# Patient Record
Sex: Female | Born: 1970 | Race: White | Hispanic: No | Marital: Married | Smoking: Never smoker
Health system: Southern US, Community
[De-identification: ages and names within clinical notes are randomized; demographics above are authoritative.]

## PROBLEM LIST (undated history)

## (undated) DIAGNOSIS — M65949 Unspecified synovitis and tenosynovitis, unspecified hand: Secondary | ICD-10-CM

## (undated) DIAGNOSIS — I493 Ventricular premature depolarization: Secondary | ICD-10-CM

## (undated) DIAGNOSIS — Z9889 Other specified postprocedural states: Secondary | ICD-10-CM

## (undated) DIAGNOSIS — F32A Depression, unspecified: Secondary | ICD-10-CM

## (undated) DIAGNOSIS — G4700 Insomnia, unspecified: Secondary | ICD-10-CM

## (undated) DIAGNOSIS — R519 Headache, unspecified: Secondary | ICD-10-CM

## (undated) DIAGNOSIS — M659 Synovitis and tenosynovitis, unspecified: Secondary | ICD-10-CM

## (undated) DIAGNOSIS — R42 Dizziness and giddiness: Secondary | ICD-10-CM

## (undated) DIAGNOSIS — M549 Dorsalgia, unspecified: Secondary | ICD-10-CM

## (undated) DIAGNOSIS — J45909 Unspecified asthma, uncomplicated: Secondary | ICD-10-CM

## (undated) DIAGNOSIS — R112 Nausea with vomiting, unspecified: Secondary | ICD-10-CM

## (undated) DIAGNOSIS — R0683 Snoring: Secondary | ICD-10-CM

## (undated) DIAGNOSIS — T8859XA Other complications of anesthesia, initial encounter: Secondary | ICD-10-CM

## (undated) DIAGNOSIS — R002 Palpitations: Secondary | ICD-10-CM

## (undated) DIAGNOSIS — I499 Cardiac arrhythmia, unspecified: Secondary | ICD-10-CM

## (undated) DIAGNOSIS — F419 Anxiety disorder, unspecified: Secondary | ICD-10-CM

## (undated) DIAGNOSIS — R0600 Dyspnea, unspecified: Secondary | ICD-10-CM

## (undated) HISTORY — DX: Ventricular premature depolarization: I49.3

## (undated) HISTORY — DX: Snoring: R06.83

## (undated) HISTORY — DX: Headache, unspecified: R51.9

## (undated) HISTORY — DX: Dizziness and giddiness: R42

## (undated) HISTORY — DX: Palpitations: R00.2

## (undated) HISTORY — PX: OTHER SURGICAL HISTORY: SHX169

## (undated) HISTORY — DX: Dorsalgia, unspecified: M54.9

## (undated) HISTORY — DX: Unspecified synovitis and tenosynovitis, unspecified hand: M65.949

## (undated) HISTORY — PX: TUBAL LIGATION: SHX77

## (undated) HISTORY — DX: Synovitis and tenosynovitis, unspecified: M65.9

## (undated) HISTORY — DX: Insomnia, unspecified: G47.00

---

## 2001-05-15 ENCOUNTER — Ambulatory Visit (HOSPITAL_COMMUNITY): Admission: RE | Admit: 2001-05-15 | Discharge: 2001-05-15 | Payer: Self-pay | Admitting: Obstetrics and Gynecology

## 2001-08-09 ENCOUNTER — Encounter (INDEPENDENT_AMBULATORY_CARE_PROVIDER_SITE_OTHER): Payer: Self-pay

## 2001-08-09 ENCOUNTER — Inpatient Hospital Stay (HOSPITAL_COMMUNITY): Admission: AD | Admit: 2001-08-09 | Discharge: 2001-08-13 | Payer: Self-pay | Admitting: Obstetrics and Gynecology

## 2001-09-12 ENCOUNTER — Other Ambulatory Visit: Admission: RE | Admit: 2001-09-12 | Discharge: 2001-09-12 | Payer: Self-pay | Admitting: Obstetrics and Gynecology

## 2002-10-23 ENCOUNTER — Other Ambulatory Visit: Admission: RE | Admit: 2002-10-23 | Discharge: 2002-10-23 | Payer: Self-pay | Admitting: Obstetrics and Gynecology

## 2003-11-29 ENCOUNTER — Other Ambulatory Visit: Admission: RE | Admit: 2003-11-29 | Discharge: 2003-11-29 | Payer: Self-pay | Admitting: Obstetrics and Gynecology

## 2004-05-18 ENCOUNTER — Encounter: Admission: RE | Admit: 2004-05-18 | Discharge: 2004-05-18 | Payer: Self-pay | Admitting: Obstetrics and Gynecology

## 2005-05-17 ENCOUNTER — Other Ambulatory Visit: Admission: RE | Admit: 2005-05-17 | Discharge: 2005-05-17 | Payer: Self-pay | Admitting: Obstetrics and Gynecology

## 2005-08-16 ENCOUNTER — Other Ambulatory Visit: Admission: RE | Admit: 2005-08-16 | Discharge: 2005-08-16 | Payer: Self-pay | Admitting: Obstetrics and Gynecology

## 2009-06-02 ENCOUNTER — Encounter: Admission: RE | Admit: 2009-06-02 | Discharge: 2009-06-02 | Payer: Self-pay | Admitting: Obstetrics and Gynecology

## 2009-09-26 ENCOUNTER — Encounter (INDEPENDENT_AMBULATORY_CARE_PROVIDER_SITE_OTHER): Payer: Self-pay | Admitting: *Deleted

## 2009-10-01 ENCOUNTER — Encounter (INDEPENDENT_AMBULATORY_CARE_PROVIDER_SITE_OTHER): Payer: Self-pay

## 2009-10-03 ENCOUNTER — Ambulatory Visit: Payer: Self-pay | Admitting: Internal Medicine

## 2010-02-23 ENCOUNTER — Encounter: Admission: RE | Admit: 2010-02-23 | Discharge: 2010-02-23 | Payer: Self-pay | Admitting: Orthopaedic Surgery

## 2010-06-04 ENCOUNTER — Encounter
Admission: RE | Admit: 2010-06-04 | Discharge: 2010-06-04 | Payer: Self-pay | Source: Home / Self Care | Attending: Obstetrics and Gynecology | Admitting: Obstetrics and Gynecology

## 2010-06-09 NOTE — Letter (Signed)
Summary: [facility] Instructions  [facility]  [address] [place], [place] [address]   Phone: [phone]  Fax: [phone]       BERNEICE ZETTLEMOYER    [birth year]    MRN: [id number]        Procedure Day Dorna Bloom:  Farrell Ours  12/05/09     Arrival Time:  7:30am     Procedure Time:  8:30am     Location of Procedure:                    _x _  [facility] (4th Floor)                        PREPARATION FOR COLONOSCOPY WITH MOVIPREP   Starting 5 days prior to your procedure SUNDAY 07/24  do not eat nuts, seeds, popcorn, corn, beans, peas,  salads, or any raw vegetables.  Do not take any fiber supplements (e.g. Metamucil, Citrucel, and Benefiber).  THE DAY BEFORE YOUR PROCEDURE         DATE: THURSDAY  07/28  1.  Drink clear liquids the entire day-NO SOLID FOOD  2.  Do not drink anything colored red or purple.  Avoid juices with pulp.  No orange juice.  3.  Drink at least 64 oz. (8 glasses) of fluid/clear liquids during the day to prevent dehydration and help the prep work efficiently.  CLEAR LIQUIDS INCLUDE: Water Jello Ice Popsicles Tea (sugar ok, no milk/cream) Powdered fruit flavored drinks Coffee (sugar ok, no milk/cream) Gatorade Juice: apple, white grape, white cranberry  Lemonade Clear bullion, consomm, broth Carbonated beverages (any kind) Strained chicken noodle soup Hard Candy                             4.  In the morning, mix first dose of MoviPrep solution:    Empty 1 Pouch A and 1 Pouch B into the disposable container    Add lukewarm drinking water to the top line of the container. Mix to dissolve    Refrigerate (mixed solution should be used within 24 hrs)  5.  Begin drinking the prep at 5:00 p.m. The MoviPrep container is divided by 4 marks.   Every 15 minutes drink the solution down to the next mark (approximately 8 oz) until the full liter is complete.   6.  Follow completed prep with 16 oz of clear liquid of your choice (Nothing  red or purple).  Continue to drink clear liquids until bedtime.  7.  Before going to bed, mix second dose of MoviPrep solution:    Empty 1 Pouch A and 1 Pouch B into the disposable container    Add lukewarm drinking water to the top line of the container. Mix to dissolve    Refrigerate  THE DAY OF YOUR PROCEDURE      DATE: FRIDAY  07/29  Beginning at 6:30 am (5 hours before procedure):         1. Every 15 minutes, drink the solution down to the next mark (approx 8 oz) until the full liter is complete.  2. Follow completed prep with 16 oz. of clear liquid of your choice.    3. You may drink clear liquids until  6:30am  (2 HOURS BEFORE PROCEDURE).   MEDICATION INSTRUCTIONS  Unless otherwise instructed, you should take regular prescription medications with a small sip of water   as early as  possible the morning of your procedure.           OTHER INSTRUCTIONS  You will need a responsible adult at least 42 years of age to accompany you and drive you home.   This person must remain in the waiting room during your procedure.  Wear loose fitting clothing that is easily removed.  Leave jewelry and other valuables at home.  However, you may wish to bring a book to read or  an iPod/MP3 player to listen to music as you wait for your procedure to start.  Remove all body piercing jewelry and leave at home.  Total time from sign-in until discharge is approximately 2-3 hours.  You should go home directly after your procedure and rest.  You can resume normal activities the  day after your procedure.  The day of your procedure you should not:   Drive   Make legal decisions   Operate machinery   Drink alcohol   Return to work  You will receive specific instructions about eating, activities and medications before you leave.    The above instructions have been reviewed and explained to me by   Ulis Rias RN  Oct 03, 2009 8:56 AM     I fully understand and can  verbalize these instructions _____________________________ Date _________

## 2010-06-09 NOTE — Miscellaneous (Signed)
Summary: Lec previsit  Clinical Lists Changes  Medications: Added new medication of MOVIPREP 100 GM  SOLR (PEG-KCL-NACL-NASULF-NA ASC-C) As per prep instructions. - Signed Rx of MOVIPREP 100 GM  SOLR (PEG-KCL-NACL-NASULF-NA ASC-C) As per prep instructions.;  #1 x 0;  Signed;  Entered by: Ulis Rias RN;  Authorized by: Iva Boop MD, [facility];  Method used: Electronically to [facility] [address], [place], [place], [place]  [address], Ph: [phone], Fax: [phone] Observations: Added new observation of NKA: T (10/03/2009 8:30)    Prescriptions: MOVIPREP 100 GM  SOLR (PEG-KCL-NACL-NASULF-NA ASC-C) As per prep instructions.  #1 x 0   Entered by:   Ulis Rias RN   Authorized by:   Iva Boop MD, [facility]   Signed by:   Ulis Rias RN on 10/03/2009   Method used:   Electronically to        [facility] (retail)       [address]       [place], [place]  [address]       Ph: [phone]       Fax: [phone]   RxID:   [id number]

## 2010-06-09 NOTE — Letter (Signed)
Summary: Previsit letter  [facility]  [address] [place], [place] [address]   Phone: [phone]  Fax: [phone]       09/26/2009 MRN: [id number]  Chelsea Flores [address] [place], [place]  [address]  Dear [name],  Welcome to the Gastroenterology Division at [facility].    You are scheduled to see a nurse for your pre-procedure visit on 10-03-09 at 8:30a.m. on the 3rd floor at [facility], [address]  We ask that you try to arrive at our office 15 minutes prior to your appointment time to allow for check-in.  Your nurse visit will consist of discussing your medical and surgical history, your immediate family medical history, and your medications.    Please bring a complete list of all your medications or, if you prefer, bring the medication bottles and we will list them.  We will need to be aware of both prescribed and over the counter drugs.  We will need to know exact dosage information as well.  If you are on blood thinners (Coumadin, Plavix, Aggrenox, Ticlid, etc.) please call our office today/prior to your appointment, as we need to consult with your physician about holding your medication.   Please be prepared to read and sign documents such as consent forms, a financial agreement, and acknowledgement forms.  If necessary, and with your consent, a friend or relative is welcome to sit-in on the nurse visit with you.  Please bring your insurance card so that we may make a copy of it.  If your insurance requires a referral to see a specialist, please bring your referral form from your primary care physician.  No co-pay is required for this nurse visit.     If you cannot keep your appointment, please call [phone] to cancel or reschedule prior to your appointment date.  This allows [place] the opportunity to schedule an appointment for another patient in need of care.    Thank you for choosing [facility] for your medical needs.   We appreciate the opportunity to care for you.  Please visit [place] at our website  to learn more about our practice.                     Sincerely.                                                                                                                   The Gastroenterology Division

## 2010-09-25 NOTE — H&P (Signed)
[facility]  Patient:    Chelsea Flores, Chelsea Flores Visit Number: [id number] MRN: [id number]          Service Type: OBS Location: 910A 9142 01 Attending Physician:  Soledad Gerlach Dictated by:   Guy Sandifer Arleta Creek, M.D. Admit Date:  08/09/2001 Discharge Date: 08/13/2001                           History and Physical  CHIEF COMPLAINT:              Term intrauterine pregnancy for repeat cesarean section.  HISTORY OF PRESENT ILLNESS:   This patient is a 40 year old married white female G2, P1 with a previous cesarean section who desires a repeat.  EDC is 08/10/01, placing her at 39-6/7 weeks.  The patient also desires permanent sterilization.  Risks of the procedure, permanence of the tubal ligation, failure rate, and increased ectopic risk have all been discussed.  All questions have been answered.  PAST MEDICAL HISTORY/PAST SURGICAL HISTORY/FAMILY HISTORY/OBSTETRIC HISTORY:    See ______ form.  MEDICATIONS:                  Prenatal vitamin daily.  ALLERGIES:                    No known drug allergies.  PHYSICAL EXAMINATION:  LUNGS:                        Clear to auscultation.  HEART:                        Regular rate and rhythm.  BACK:                         Without CVA tenderness.  BREASTS:                      Not examined.  ABDOMEN:                      Gravida, nontender.  Cervix not examined.  EXTREMITIES:                  Grossly within normal limits.  NEUROLOGIC:                   Grossly within normal limits.  LABORATORY DATA:              Blood type O-, Rh antibody screen negative, VDRL nonreactive, rubella immune, hepatitis B surface antigen negative.  ASSESSMENT:                   Term intrauterine pregnancy.  PLAN:                         Repeat cesarean section and tubal ligation. Dictated by:   Guy Sandifer Arleta Creek, M.D. Attending Physician:  Soledad Gerlach DD:  09/08/01 TD:  09/08/01 Job:  [id number]

## 2010-09-25 NOTE — Discharge Summary (Signed)
[facility]  Patient:    Chelsea Flores, Chelsea Flores Visit Number: [id number] MRN: [id number]          Service Type: OBS Location: 910A 9142 01 Attending Physician:  Soledad Gerlach Dictated by:   Julio Sicks, N.P. Admit Date:  08/09/2001 Discharge Date: 08/13/2001                             Discharge Summary  ADMISSION DIAGNOSES:          1. Intrauterine pregnancy at term.                               2. History of low transverse cesarean section,                                  desires repeat.                               3. Desires permanent sterilization.  DISCHARGE DIAGNOSES:          1. Repeat low transverse cesarean section.                               2. Viable female infant.                               3. Permanent sterilization.  PROCEDURE:                    Repeat low transverse cesarean section and bilateral tubal ligation.  REASON FOR ADMISSION:         Please see dictated H&P.  HOSPITAL COURSE:              The patient was admitted on August 09, 2001, for the above named procedure without complications.  A viable female infant weighing 7 pounds 6 ounces with Apgars of 9 at one minute and 9 at five minutes was delivered.  Cord pH was 7.30.  On postoperative day #1, the patient had adequate return of bowel function.  She was tolerating a liquid diet without complaints of nausea and vomiting.  Fundus was firm.  Abdomen was soft.  Abdominal dressing clean, dry, and intact.  Hemoglobin was 10.7. Hematocrit 31.3, WBC 8.7.  Foley catheter was discontinued.  On postoperative day #2, the patient was tolerating a regular diet, ambulating without assistance, voiding without difficulty.  Fundus was firm, incision was clean, dry, and intact with slight ecchymosis noted below the incisional site. RhoGAM was administered to mother.  On postoperative day #3, the patient complained of ap ositional headache.  Anesthesia was in for evaluation.   Blood patch was applied.  On postoperative day #4, headache was resolved.  Incision was clean, dry, and intact.  Staples were removed.  The patient was discharged home.  CONDITION ON DISCHARGE:       Good.  DIET:                         Regular as tolerated.  ACTIVITY:  No heavy lifting, no driving, no vaginal entry.  FOLLOW-UP:                    The patient is to follow up in the office in one to two weeks for an incision check.  She is to call for a temperature greater than 100 degrees, persistent nausea and vomiting, heavy vaginal bleeding, and/or redness or draiange from the incision site.  DISCHARGE MEDICATIONS:        1. Zoloft 50 mg one p.o. q.d.                               2. Ambien 10 mg one p.o. at h.s. p.r.n.                                  insomnia.                               3. Prescription for Tylox #20 one to two every                                  four to six hours p.r.n. pain.                               4. Prenatal vitamins one p.o. q.d. Dictated by:   Julio Sicks, N.P. Attending Physician:  Soledad Gerlach DD:  08/29/01 TD:  08/29/01 Job: [id number]

## 2010-09-25 NOTE — Op Note (Signed)
[facility]  Patient:    Chelsea Flores, Chelsea Flores Visit Number: [id number] MRN: [id number]          Service Type: Attending:  Guy Sandifer. Arleta Creek, M.D. Dictated by:   Guy Sandifer Arleta Creek, M.D. Proc. Date: 08/09/01                             Operative Report  PREOPERATIVE DIAGNOSES:       Desires repeat cesarean section and permanent sterilization.  POSTOPERATIVE DIAGNOSES:      Desires repeat cesarean section and permanent sterilization.  PROCEDURE:                    Repeat low transverse cesarean section with bilateral tubal ligation.  SURGEON:                      Guy Sandifer. Arleta Creek, M.D.  ANESTHESIA:                   Spinal.  ESTIMATED BLOOD LOSS:         800 cc.  FINDINGS:                     Viable female infant.  Apgars 9 and 9 at one and five minutes respectively.  Arterial cord pH 7.30.  Birth weight pending.  INDICATIONS AND CONSENT:      This patient is a 40 year old married white female G2, P1 with a previous cesarean section who desires repeat.  She also desires permanent sterilization.  Her EDC is August 10, 2001.  Potential risks and complications have been discussed with the patient.  Tubal ligation with its permanence and failure rate and increased ectopic risk has also been discussed.  All questions are answered and consent is signed on the chart.  PROCEDURE:                    Patient is taken to the operating room where a spinal anesthetic is placed.  She is then placed in the dorsal supine position with a 15 degree left lateral wedge.  She is then prepped.  Foley catheter is placed in the bladder as a drain and she is draped in a sterile fashion. After testing for adequate spinal anesthesia, skin is entered through the previous Pfannenstiel scar which is removed on the way in.  Dissection is carried out in layers to the peritoneum.  The vesicouterine peritoneum had adhered very high on the anterior uterine fundus.  This was  taken down.  The uterus is then incised in a low transverse manner.  The uterine cavity is entered bluntly with a Kelly clamp.  The lower part of an anterior placenta is encountered on the way in.  Going under the placenta the vertex is delivered. Artificial rupture of membranes for clear fluid is carried out and the vertex is delivered.  The oro and nasopharynx are suctioned.  Nuchal cord x1 is noted.  Remainder of the infant is delivered.  Good cry and tone is noted. The infant is handed to the awaiting pediatrics team.  Placenta is manually delivered.  There is a slight heart shape to the internal uterine contour. Uterus is clean.  Uterus is closed in a running locking fashion with 0 Monocryl suture which achieves good hemostasis.  The left fallopian tube is identified from cornu to fimbria, grasped in its mid ampullary portion  with the Babcock clamp, and a knuckle of tube is then ligated with two 0 plain sutures.  The intervening knuckle of fallopian tube is then sharply resected. Hemostasis is obtained completely with cautery.  A similar procedure is carried out on the right fallopian tube.  Ovaries are normal bilaterally. 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 Irrigation is carried out.  The rectus muscle was reapproximated in the midline with interrupted 0 Monocryl suture.  THe anterior rectus fascia is closed in a running fashion with 0 PDS suture.  Skin is closed with clips. All sponge, instrument, and needle counts are correct.  The patient is transferred to the recovery room in stable condition. Dictated by:   Guy Sandifer Arleta Creek, M.D. Attending:  Guy Sandifer Arleta Creek, M.D. DD:  08/09/01 TD:  08/09/01 Job: [id number]

## 2011-05-12 ENCOUNTER — Other Ambulatory Visit: Payer: Self-pay | Admitting: Obstetrics and Gynecology

## 2011-05-12 DIAGNOSIS — Z1231 Encounter for screening mammogram for malignant neoplasm of breast: Secondary | ICD-10-CM

## 2011-06-07 ENCOUNTER — Ambulatory Visit
Admission: RE | Admit: 2011-06-07 | Discharge: 2011-06-07 | Disposition: A | Discharge status: Home / Self Care | Payer: BC Managed Care – PPO | Source: Ambulatory Visit | Attending: Obstetrics and Gynecology | Admitting: Obstetrics and Gynecology

## 2011-06-07 DIAGNOSIS — Z1231 Encounter for screening mammogram for malignant neoplasm of breast: Secondary | ICD-10-CM

## 2012-04-13 ENCOUNTER — Ambulatory Visit (INDEPENDENT_AMBULATORY_CARE_PROVIDER_SITE_OTHER): Payer: BC Managed Care – PPO | Admitting: Sports Medicine

## 2012-04-13 ENCOUNTER — Encounter: Payer: Self-pay | Admitting: Sports Medicine

## 2012-04-13 VITALS — BP 103/70 | HR 74 | Ht 68.0 in | Wt 143.0 lb

## 2012-04-13 DIAGNOSIS — M25559 Pain in unspecified hip: Secondary | ICD-10-CM

## 2012-04-13 DIAGNOSIS — M25551 Pain in right hip: Secondary | ICD-10-CM

## 2012-04-13 NOTE — Assessment & Plan Note (Signed)
I advised her in a series of hip rehabilitation exercises to do at home on a daily basis  After 2 weeks resume running -- 20 minutes 4 times a week She can increase by 5 minutes weekly Recheck by me in 6 weeks  Key to resolving this will be to increase the hip abduction strength on the right  If not resolving I will ultrasound on return to clinic

## 2012-04-13 NOTE — Patient Instructions (Addendum)
Please do suggested hip exercises daily  Ok to start easing into back into running in 2 weeks  Please follow up in 6 weeks  Thank you for seeing [place] today!

## 2012-04-13 NOTE — Progress Notes (Signed)
Patient ID: Chelsea Flores, female   DOB: 09/01/1970, 41 y.o.   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 RT outer hip pain First in 2000 after childbirth - had to labor on RT side  Then past 2 yrs pops and painful upper outer hip MRI negative for any joint issue This keeps recurring and stops running  Examination Thin and fit

## 2012-04-13 NOTE — Progress Notes (Signed)
  Subjective:    Patient ID: Chelsea Flores, female    DOB: [birth year], 41 y.o.   MRN: [id number]  HPI  Pt presents to clinic for evaluation of rt posterior hip pain x more than 1 year. Ran 5 marathons between 2005-2008 with no major hip problems.  Went skiing about 1 year ago, and noticed a sharp hip pain on this trip. Since then has had a sharp pain with running at 0.5 mile that does not persist long, but has not been running in 1 year 2/2 pain. Has seen Dr. Rayburn Ma- had negative MR arthrogram.  Would like to start running again.   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 She had significant hip pain after having to labor on her right side in year 2000 during childbirth induction.  Review of Systems     Objective:   Physical Exam Thin and fit  130 deg hip rotation bilat Excellent alignment Normal leg lengths Note significant tenderness to palpation over the greater trochanter on either side. On the right hip there is some mild tenderness to palpation over the mid belly of the gluteus medius muscle. Hip abduction strength on the right is significantly less than on the left. Hip rotation and hip flexion strength are normal. Good quadriceps development bilaterally.  Feet show normal structure with preserved arches  Running gait shows good form with a neutral foot strike       Assessment & Plan:

## 2012-05-09 ENCOUNTER — Other Ambulatory Visit: Payer: Self-pay | Admitting: Obstetrics and Gynecology

## 2012-05-09 DIAGNOSIS — Z1231 Encounter for screening mammogram for malignant neoplasm of breast: Secondary | ICD-10-CM

## 2012-05-25 ENCOUNTER — Ambulatory Visit: Payer: BC Managed Care – PPO | Admitting: Sports Medicine

## 2012-06-08 ENCOUNTER — Ambulatory Visit
Admission: RE | Admit: 2012-06-08 | Discharge: 2012-06-08 | Disposition: A | Discharge status: Home / Self Care | Payer: BC Managed Care – PPO | Source: Ambulatory Visit | Attending: Obstetrics and Gynecology | Admitting: Obstetrics and Gynecology

## 2012-06-08 DIAGNOSIS — Z1231 Encounter for screening mammogram for malignant neoplasm of breast: Secondary | ICD-10-CM

## 2012-07-13 ENCOUNTER — Ambulatory Visit: Payer: BC Managed Care – PPO | Admitting: Sports Medicine

## 2012-07-13 ENCOUNTER — Encounter: Payer: Self-pay | Admitting: Sports Medicine

## 2012-07-13 ENCOUNTER — Ambulatory Visit (INDEPENDENT_AMBULATORY_CARE_PROVIDER_SITE_OTHER): Payer: BC Managed Care – PPO | Admitting: Sports Medicine

## 2012-07-13 VITALS — Ht 68.0 in | Wt 145.0 lb

## 2012-07-13 DIAGNOSIS — M25551 Pain in right hip: Secondary | ICD-10-CM

## 2012-07-13 NOTE — Progress Notes (Signed)
Patient ID: Chelsea Flores, female   DOB: 01-01-1971, 42 y.o.   MRN: [id number]  Patient with a history of right lateral hip pain that has been present since 2000 Since her last visit she is 70% better She has been able to run around 2-1/2 miles no pain 3 miles causes some mild pain She has been faithful doing the exercises  She feels stronger   For the past 2 weeks she's had benign positional vertigo and has had to back off of running   Examination   No acute distress   Hip: ROM IR: 45 deg bilat and ER is 80 deg Strength is IR: 5/5, ER: 5/5, Flexion: 5/5, Extension: 5/5, Abduction: 5/5, Adduction: 5/5 Pelvic alignment unremarkable to inspection and palpation. Standing hip rotation and gait without trendelenburg / unsteadiness. Greater trochanter without tenderness to palpation. No tenderness over piriformis and greater trochanter. No SI joint tenderness and normal FABER She has a markedly improved strength on abduction movement.

## 2012-07-13 NOTE — Assessment & Plan Note (Signed)
Her problems seem to be related to gluteus medius weakness  She has made excellent improvement  Keep up exercises 3 times a week  Recheck if symptoms recur or if she has new problems

## 2013-06-26 ENCOUNTER — Other Ambulatory Visit: Payer: Self-pay

## 2013-06-26 DIAGNOSIS — Z1231 Encounter for screening mammogram for malignant neoplasm of breast: Secondary | ICD-10-CM

## 2013-07-12 ENCOUNTER — Ambulatory Visit
Admission: RE | Admit: 2013-07-12 | Discharge: 2013-07-12 | Disposition: A | Discharge status: Home / Self Care | Payer: Self-pay | Source: Ambulatory Visit

## 2013-07-12 DIAGNOSIS — Z1231 Encounter for screening mammogram for malignant neoplasm of breast: Secondary | ICD-10-CM

## 2013-08-16 ENCOUNTER — Encounter: Payer: Self-pay | Admitting: Sports Medicine

## 2013-08-16 ENCOUNTER — Ambulatory Visit (INDEPENDENT_AMBULATORY_CARE_PROVIDER_SITE_OTHER): Payer: BC Managed Care – PPO | Admitting: Sports Medicine

## 2013-08-16 VITALS — BP 113/78 | HR 80 | Ht 68.0 in | Wt 145.0 lb

## 2013-08-16 DIAGNOSIS — M25559 Pain in unspecified hip: Secondary | ICD-10-CM

## 2013-08-16 NOTE — Patient Instructions (Addendum)
Complete exercises given and call back in 6 weeks if needed.  You can continue to run as long as no pain occurs.

## 2013-08-16 NOTE — Progress Notes (Signed)
Subjective:     Patient ID: Chelsea Flores, female   DOB: [birth year], 43 y.o.   MRN: [id number]  HPI   Left Hip Pain: Patient reports she is having difficulty over the last few days with rotation of her hip. She reports she was reaching for something above her head and rotated at her hips from external rotation to internal and she experienced a sharp stabbing pain in her buttock area on the left that made her fall to the ground. She states the pain was so strong she thought she dislocated her hip. She has felt a similar pain yesterday with internal rotation of her hip while sitting on a stool. She has no injury that she is aware of and is an avid runner. She is having complications with her daily running.   In the past we treated her for right hip pain from gluteus medius weakness and that has resolved  Review of Systems Negative, with the exception of above mentioned in HPI     Objective:   Physical Exam BP 113/78  Pulse 80  Ht 5\' 8"  (1.727 m)  Wt 145 lb (65.772 kg)  BMI 22.05 kg/m2 Gen: NAD. Well nourished, well developed,physically fit female MSK: Negative leg raises bilaterally. Left FABRE with pain in muscle/lateral hip, no SI or HIP joint pain. 5/5 muscle strength of the left hip. No Gluteus  Medius weakness.  Normal ROM with pain on external rotation and internal rotation.  She has a deep area of palpable pain that seems to be over the inferior gemellus muscle  Assessment/plan:  BRITLEE SKOLNIK is 43 y.o. female with likely an inferior gemellus muscle insertion injury.  - [place] in office: Small calcification and possible tear of inferior gemellus muscle.  - Nitro considered, however contraindicated with her history of migraines.  - Hip exercises given. - Apply heat and deep tissue massage may be beneficial.  - f/u in 6 weeks if needed.

## 2013-08-16 NOTE — Assessment & Plan Note (Signed)
Patient with a second episode of lateral hip pain the some of the left  She does have a history of some possible disc disease but this does not sound related  See plan in the progress note

## 2014-08-05 ENCOUNTER — Other Ambulatory Visit: Payer: Self-pay | Admitting: Obstetrics and Gynecology

## 2014-08-05 DIAGNOSIS — R928 Other abnormal and inconclusive findings on diagnostic imaging of breast: Secondary | ICD-10-CM

## 2014-08-08 ENCOUNTER — Ambulatory Visit
Admission: RE | Admit: 2014-08-08 | Discharge: 2014-08-08 | Disposition: A | Discharge status: Home / Self Care | Payer: BLUE CROSS/BLUE SHIELD | Source: Ambulatory Visit | Attending: Obstetrics and Gynecology | Admitting: Obstetrics and Gynecology

## 2014-08-08 DIAGNOSIS — R928 Other abnormal and inconclusive findings on diagnostic imaging of breast: Secondary | ICD-10-CM

## 2014-12-10 ENCOUNTER — Other Ambulatory Visit: Payer: Self-pay | Admitting: Obstetrics and Gynecology

## 2014-12-10 DIAGNOSIS — N6310 Unspecified lump in the right breast, unspecified quadrant: Secondary | ICD-10-CM

## 2014-12-16 ENCOUNTER — Ambulatory Visit
Admission: RE | Admit: 2014-12-16 | Discharge: 2014-12-16 | Disposition: A | Discharge status: Home / Self Care | Payer: BLUE CROSS/BLUE SHIELD | Source: Ambulatory Visit | Attending: Obstetrics and Gynecology | Admitting: Obstetrics and Gynecology

## 2014-12-16 DIAGNOSIS — N6310 Unspecified lump in the right breast, unspecified quadrant: Secondary | ICD-10-CM

## 2015-05-09 ENCOUNTER — Ambulatory Visit
Admission: RE | Admit: 2015-05-09 | Discharge: 2015-05-09 | Disposition: A | Discharge status: Home / Self Care | Payer: BLUE CROSS/BLUE SHIELD | Source: Ambulatory Visit | Attending: Orthopaedic Surgery | Admitting: Orthopaedic Surgery

## 2015-05-09 ENCOUNTER — Other Ambulatory Visit: Payer: Self-pay | Admitting: Orthopaedic Surgery

## 2015-05-09 DIAGNOSIS — R609 Edema, unspecified: Secondary | ICD-10-CM

## 2015-05-09 DIAGNOSIS — R52 Pain, unspecified: Secondary | ICD-10-CM

## 2015-05-09 DIAGNOSIS — M79662 Pain in left lower leg: Secondary | ICD-10-CM

## 2015-05-12 ENCOUNTER — Ambulatory Visit (INDEPENDENT_AMBULATORY_CARE_PROVIDER_SITE_OTHER): Payer: BLUE CROSS/BLUE SHIELD | Admitting: Sports Medicine

## 2015-05-12 ENCOUNTER — Encounter: Payer: Self-pay | Admitting: Sports Medicine

## 2015-05-12 VITALS — BP 110/60 | Ht 68.0 in | Wt 145.0 lb

## 2015-05-12 DIAGNOSIS — S86812A Strain of other muscle(s) and tendon(s) at lower leg level, left leg, initial encounter: Secondary | ICD-10-CM

## 2015-05-12 DIAGNOSIS — S86112A Strain of other muscle(s) and tendon(s) of posterior muscle group at lower leg level, left leg, initial encounter: Secondary | ICD-10-CM

## 2015-05-12 NOTE — Patient Instructions (Signed)
You have a few partial tears of your Left medial gastroc (and soleus) muscles, also have posterior knee head of left proximal gastroc partial tear. There is no hematoma or other injuries seen on Ultrasound.  Recommend to wear compression sleeve, wear heel lifts in both shoes to (reduce stretching calf muscles). - You may use ice and elevate as needed if sitting for long periods - Do NOT do active stretching or use heat for torn muscle Move ankle gradually. Keep toe walking for now to stay active. Start stationary exercise bike daily for a test. Once you are able to use stationary bike and/or walk then we will progress activity.  Return in 2 weeks for re-evaluation, repeat US

## 2015-05-12 NOTE — Progress Notes (Signed)
Subjective:    Patient ID: Chelsea Flores, female    DOB: [birth year], 45 y.o.   MRN: [id number]  Chelsea Flores is a 45 y.o. female presenting on 05/12/2015 for No chief complaint on file.  HPI  LEFT CALF PAIN: - Patient presents after injury to Left calf while skiing in [place] on 05/04/15. Describes injury as "felt a severe painful pop in Left calf" after just started skiing without any known inciting injury, trauma, fall, or incident, pain 8/10 localized to left calf and posterior knee, she gradually had a controlled fall on her skiis and denies any other injury. Did not hit head or hurt her knees. Unable to bear weight after fall. [facility], [facility] imaging, told it was a calf muscle tear, given Norco 7.5/325 and Ibuprofen 800 PRN, infrequently taking these now. Associated with some swelling and mild bruising. - Today Left calf pain is improved, now no longer painful at rest, only pain with activity or palpation. She had been using ice, ACE wrap, occasional crutches, still walking on left toes, has not resumed running or other activities. Still having pain in posterior knee.  - She is an active runner and skiier, no known prior injuries to left lower extremity. Has upcoming ski trip to [place] 06/21/2015.  No past medical history on file.  Social History   Social History  . Marital Status: Married    Spouse Name: N/A  . Number of Children: N/A  . Years of Education: N/A   Occupational History  . Not on file.   Social History Main Topics  . Smoking status: Never Smoker   . Smokeless tobacco: Never Used  . Alcohol Use: Not on file  . Drug Use: Not on file  . Sexual Activity: Not on file   Other Topics Concern  . Not on file   Social History Narrative    No current outpatient prescriptions on file prior to visit.   No current facility-administered medications on file prior to visit.    Review of Systems Per HPI unless specifically indicated above     Objective:      BP 110/60 mmHg  Ht 5\' 8"  (1.727 m)  Wt 145 lb (65.772 kg)  BMI 22.05 kg/m2  Wt Readings from Last 3 Encounters:  05/12/15 145 lb (65.772 kg)  08/16/13 145 lb (65.772 kg)  07/13/12 145 lb (65.772 kg)    Physical Exam  Constitutional: She appears well-developed and well-nourished. No distress.  HENT:  Head: Normocephalic and atraumatic.  Mouth/Throat: Oropharynx is clear and moist.  Neck: Normal range of motion. Neck supple.  Cardiovascular: Normal rate, regular rhythm and normal heart sounds.   No murmur heard. Intact distal pulses bilateral lower ext  Musculoskeletal:       Left lower leg: She exhibits tenderness and swelling. She exhibits no bony tenderness, no edema, no deformity and no laceration.       Legs: Lower Extremities: Left calf with mild swelling compared to right. Knees intact without deformity or effusion, knee ROM and structurally intact. Ankles without swelling, full active ROM. Thompson test bilateral intact achilles with plantar flexion.  Neurological: She is alert.  Strength 5/5 left ankle dorsi/plantar flexion, RLE str intact 5/5  Skin: Skin is warm. She is not diaphoretic.  Nursing note and vitals reviewed.  No results found for this or any previous visit.    Assessment & Plan:   Problem List Items Addressed This Visit    None  Visit Diagnoses    Gastrocnemius muscle tear, left, initial encounter    -  Primary       Meds ordered this encounter  Medications  . methocarbamol (ROBAXIN) 500 MG tablet    Sig: TK 1 T PO Q 8 HOURS PRF SPASMS    Refill:  0   Gradually improving partial tear left medial gastroc / soleus muscles, also with area of tear proximally left med gastroc head posterior to knee. Left knee and achilles intact. Neurovascularly intact. - Bedside US performed. No obvious hematoma identified. Some evidence of partial tears to support dx.  Plan: 1. RICE therapy - relative rest, can stay active, no immobilization, Left calf  compression sleeve, ice and elevation PRN. Start test on stationary bike regularly. No resuming running, heat or active stretching. 2. Bilateral heel lifts to reduce stretching calf 3. RTC 2 weeks, re-evaluate progress and repeat US. Hopeful that patient will be able to gradually increase activity and may be able to go on [place] ski trip 06/21/15   Follow up plan: Return in about 2 weeks (around 05/26/2015) for partial left gastroc tear.  Nobie Putnam, [facility], PGY-3  Patient seen and evaluated with the resident. I agree with the plan of care. Ultrasound of her left calf showed hypoechoic changes within the soleus consistent with a partial tear here. Treatment as above. Follow-up in 2 weeks for check on progress and repeat ultrasound.

## 2015-05-29 ENCOUNTER — Encounter: Payer: Self-pay | Admitting: Sports Medicine

## 2015-05-29 ENCOUNTER — Ambulatory Visit (INDEPENDENT_AMBULATORY_CARE_PROVIDER_SITE_OTHER): Payer: BLUE CROSS/BLUE SHIELD | Admitting: Sports Medicine

## 2015-05-29 VITALS — BP 103/66 | Ht 68.0 in | Wt 145.0 lb

## 2015-05-29 DIAGNOSIS — S86812D Strain of other muscle(s) and tendon(s) at lower leg level, left leg, subsequent encounter: Secondary | ICD-10-CM

## 2015-05-30 NOTE — Progress Notes (Signed)
   Subjective:    Patient ID: Chelsea Flores, female    DOB: 08/18/1970, 45 y.o.   MRN: [id number]  HPI   Dr. Sheralyn Boatman comes in today for follow-up on a left calf strain. Overall, she is doing well. Symptoms have improved by about 70%. However, she is not quite back to 100%. The pain in her calf has subsided. Her primary pain now is in the medial proximal gastroc. She has been using her green sports insoles with heel lifts. This does seem to help. The compression sleeve has also been helpful. Prior ultrasound showed some irregularity in the mid substance of the soleus consistent with a possible tear here. She is leaving for a ski trip overseas in about a month.    Review of Systems     Objective:   Physical Exam Well-developed, well-nourished. No acute distress  Left calf: There is still some slight tenderness to palpation along the mid substance of the medial gastroc but not as significant as on her prior visit. Her area of maximum tenderness is actually more proximal to this in the medial head of the gastroc where there is also a palpable indurated area which may be the popliteus tendon. There is no swelling. No ecchymosis. She has reproducible pain with knee extension and dorsiflexion of the ankle. Neurovascular intact distally. Walking with a slight limp.  MSK ultrasound of the left calf was performed. There is still some irregularity in the mid substance of the soleus but it is not as noticeable as on the previous scan. Proximal gastroc appears unremarkable. Popliteus tendon is difficult to identify.       Assessment & Plan:  Improving left calf strain  Clinically the patient is doing well. We will discontinue her calf sleeve and instead use a knee sleeve since her pain is primarily in the proximal medial gastroc. I also think she would do well with some physical therapy. She has a physical therapist that she has worked with before. She will continue with her heel lifts and follow-up  with me a few days prior to her trip overseas. Call with questions or concerns in the interim.

## 2015-06-16 ENCOUNTER — Encounter: Payer: Self-pay | Admitting: Sports Medicine

## 2015-06-16 ENCOUNTER — Ambulatory Visit (INDEPENDENT_AMBULATORY_CARE_PROVIDER_SITE_OTHER): Payer: BLUE CROSS/BLUE SHIELD | Admitting: Sports Medicine

## 2015-06-16 VITALS — BP 121/3 | HR 67 | Ht 68.0 in | Wt 145.0 lb

## 2015-06-16 DIAGNOSIS — S86812D Strain of other muscle(s) and tendon(s) at lower leg level, left leg, subsequent encounter: Secondary | ICD-10-CM | POA: Diagnosis not present

## 2015-06-16 NOTE — Progress Notes (Signed)
  Chelsea Flores - 45 y.o. female MRN [id number]  Date of birth: 02/19/1971  SUBJECTIVE:  Including CC & ROS.  No chief complaint on file. CC: follow up calf pain  HPI: Dr. Sheralyn Boatman presents today for follow up of left calf strain that occurred in Dec 2016 while skiing. Since her last visit in January, patient reports her pain has significantly improved. She continues to have occasional twinges of pain after isometric abduction or if she tries to jog. Has switched back to wearing her calf sleeve, as the knee brace caused more discomfort. Continues to do her calf raises tid and stays active on the stationary bike. Has not been able to jog since the accident due to pain. Is traveling to [place] next week for a weeklong ski trip.    HISTORY: Past Medical, Surgical, Social, and Family History Reviewed & Updated per EMR.   Pertinent Historical Findings include: Works as a Animal nutritionist in town. Non-smoker. No history of prior knee or calf injury.  DATA REVIEWED: L lower leg [place]: Normal-appearing Achilles tendon, gastrocnemius, and soleus. Palmaris not well visualized. Medial meniscus without abnormality.  PHYSICAL EXAM:  VS: BP:(!) 121/3 mmHg  HR:67bpm  TEMP: ( )  RESP:   HT:5\' 8"  (172.7 cm)   WT:145 lb (65.772 kg)  BMI:22.1 PHYSICAL EXAM: L knee:  No erythema or effusion. Mild TTP over proximal medial calf. No TTP over medial or lateral joint lines. No pain with calf stretching. Full ROM. No valgus or varus laxity.  5/5 strength with flexion and extension Negative anterior and posterior drawer. Neurovascularly intact  L ankle: No erythema or effusion.  No TTP over forefoot, midfoot, or calcaneous. No TTP over medial or lateral malleolus. Slightly decreased dorsiflexion when compared to right. Normal plantarflexion. No pain or limited movement with inversion or eversion. Neurovascularly intact.  ASSESSMENT & PLAN: See problem based charting & AVS for pt instructions. 1. Left calf strain  - Patient with significant clinical improvement since initial injury and since last visit. Ultrasound of left calf and knee without abnormality. Continues to wear her calf sleeve and do calf-strengthening exercises. Encouraged patient to gradually resume activities and to use her judgment while skiing. - Continue calf sleeve for support and calf-strengthening exercises - Follow up as needed

## 2015-07-09 ENCOUNTER — Other Ambulatory Visit: Payer: Self-pay

## 2015-07-09 DIAGNOSIS — Z1231 Encounter for screening mammogram for malignant neoplasm of breast: Secondary | ICD-10-CM

## 2015-08-08 ENCOUNTER — Ambulatory Visit
Admission: RE | Admit: 2015-08-08 | Discharge: 2015-08-08 | Disposition: A | Discharge status: Home / Self Care | Payer: BLUE CROSS/BLUE SHIELD | Source: Ambulatory Visit

## 2015-08-08 DIAGNOSIS — Z1231 Encounter for screening mammogram for malignant neoplasm of breast: Secondary | ICD-10-CM

## 2015-09-01 ENCOUNTER — Other Ambulatory Visit: Payer: Self-pay | Admitting: *Deleted

## 2015-09-01 MED ORDER — DICLOFENAC SODIUM 1 % TD GEL: 2.0000 g | Freq: Four times a day (QID) | TRANSDERMAL | Status: DC

## 2016-01-09 ENCOUNTER — Encounter: Payer: Self-pay | Admitting: Family Medicine

## 2016-01-09 ENCOUNTER — Ambulatory Visit (INDEPENDENT_AMBULATORY_CARE_PROVIDER_SITE_OTHER): Payer: BLUE CROSS/BLUE SHIELD | Admitting: Family Medicine

## 2016-01-09 ENCOUNTER — Encounter (INDEPENDENT_AMBULATORY_CARE_PROVIDER_SITE_OTHER): Payer: Self-pay

## 2016-01-09 VITALS — BP 104/62 | Ht 68.0 in | Wt 145.0 lb

## 2016-01-09 DIAGNOSIS — M79672 Pain in left foot: Secondary | ICD-10-CM | POA: Diagnosis not present

## 2016-01-09 MED ORDER — NITROGLYCERIN 0.2 MG/HR TD PT24: MEDICATED_PATCH | TRANSDERMAL | 1 refills | Status: DC

## 2016-01-09 NOTE — Patient Instructions (Signed)
Cut patch into 1/8 pieces Place a 1/8 piece of patch on  skin over affected area, changing to a new piece every 24 hours.   You may experience a headache during the first 1-2 days and maybe up to 2 weeks of using the patch; these should improve and go away. If you experience headaches after beginning nitroglycerin patch treatment, you may take your preferred over the counter pain medicine such as tylenol or advil as directed on the box. Another side effect of the nitroglycerin patch can be skin irritation  or rash related to the patch adhesive.   Please notify our office if you develop  severe headaches or rash, and stop the patch.  Please call our office with any questions or problems. Tendon healing with nitroglycerin patch may require 12 to 24 weeks depending on the extent of injury. Men should not use if taking Viagra, Cialis, or Levitra as it may cause an unsafe lowering of the blood pressure..  Use with caution if you have migraines or rosacea.

## 2016-01-09 NOTE — Progress Notes (Signed)
  Chelsea Flores - 45 y.o. female MRN [id number]  Date of birth: 23-Nov-1970  SUBJECTIVE:  Including CC & ROS.  Chief Complaint  Patient presents with  . Foot Pain   Chelsea Flores is a 45 year old female is presenting with heel pain. The pain started in March of this year. Last December she tore her soleus and was not able to walk normally for the next 2 months. She started to begin to run in March and started noticing a gradual onset of pain in her heel at that time. The pain is in the medial midfoot and it occurs at the beginning of a run. She is able to run through it but has excruciating pain the next day especially with the first she steps in the morning. The pain is roughly a 6 out of 10. She has tried heel cups, over-the-counter insoles, and Advil. She is also tried Voltaren gel with minimal improvement. She is not actively training for a race. She has not run in 2 weeks.  ROS: No unexpected weight loss, fever, chills, swelling, instability, muscle pain, numbness/tingling, redness, otherwise see HPI    HISTORY: Past Medical, Surgical, Social, and Family History Reviewed & Updated per EMR.   Pertinent Historical Findings include: PMSHx -  Two c-sections  PSHx -  No tobacco, occasional EtOH use, [employer]   FHx -  Mother with a type of RA    DATA REVIEWED: None to review   PHYSICAL EXAM:  VS: BP:104/62  HR: bpm  TEMP: ( )  RESP:   HT:5\' 8"  (172.7 cm)   WT:145 lb (65.8 kg)  BMI:22.1 PHYSICAL EXAM: Gen: NAD, alert, cooperative with exam, well-appearing HEENT: clear conjunctiva, EOMI CV:  no edema, capillary refill brisk,  Resp: non-labored, normal speech Skin: no rashes, normal turgor  Neuro: no gross deficits.  Psych:  alert and oriented Foot:  Normal to inspection. No tenderness palpation of the metatarsals. Tenderness to palpation over the insertion of the plantar fashion to the calcaneus. No pain with insertion of the Achilles into the calcaneus. Some tenderness on the  medial and lateral aspect calcaneus. Normal range of motion. Normal strength resistance and flexion, extension, inversion and eversion. Normal knee flexion and extension.   Limited ultrasound: Left foot: Plantar fascia with a hypoechoic change at the insertion into the calcaneus. This is suspect for for a partial tear. Right plantar fascia looks to be a normal appearance for comparison. Achilles was viewed and long and short axis and appeared to be normal with a normal insertion.  ASSESSMENT & PLAN:   Left foot pain Exam and ultrasound are suggestive of plantar fasciitis. There appears to be a small tear within the insertion of the plantar fascia. Achilles appears to be normal. Most likely her history of a soleus tear contributed to this as well. - Try nitroglycerin protocol. Advised to cut 1/8 patches as she has a history of migraines. She will stop if not tolerating it. - Provided midfoot straps on both feet. -Placed a scaphoid pad on the left - She will follow-up in 4-5 weeks. She may want to consider custom orthotics at some point. 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

## 2016-01-09 NOTE — Assessment & Plan Note (Signed)
Exam and ultrasound are suggestive of plantar fasciitis. There appears to be a small tear within the insertion of the plantar fascia. Achilles appears to be normal. Most likely her history of a soleus tear contributed to this as well. - Try nitroglycerin protocol. Advised to cut 1/8 patches as she has a history of migraines. She will stop if not tolerating it. - Provided midfoot straps on both feet. -Placed a scaphoid pad on the left - She will follow-up in 4-5 weeks. She may want to consider custom orthotics at some point. 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

## 2016-01-11 NOTE — Progress Notes (Signed)
[facility]: Attending Note: I have reviewed the chart, discussed wit the Sports Medicine Fellow. I agree with assessment and treatment plan as detailed in the Fellow's note.  

## 2016-01-19 ENCOUNTER — Other Ambulatory Visit: Payer: Self-pay | Admitting: *Deleted

## 2016-01-19 MED ORDER — NITROGLYCERIN 0.2 MG/HR TD PT24: MEDICATED_PATCH | TRANSDERMAL | 1 refills | Status: DC

## 2016-02-13 ENCOUNTER — Ambulatory Visit (INDEPENDENT_AMBULATORY_CARE_PROVIDER_SITE_OTHER): Payer: BLUE CROSS/BLUE SHIELD | Admitting: Family Medicine

## 2016-02-13 ENCOUNTER — Encounter: Payer: Self-pay | Admitting: Family Medicine

## 2016-02-13 DIAGNOSIS — M79672 Pain in left foot: Secondary | ICD-10-CM | POA: Diagnosis not present

## 2016-02-14 NOTE — Assessment & Plan Note (Signed)
Continue NTG patch and relative rest. Discussed no 6 mile jaunts---especially on the [place].  F/u 6-8 weeks.

## 2016-02-14 NOTE — Progress Notes (Signed)
    CHIEF COMPLAINT / HPI:  LEFt heel pain. She had about 20% improvement initially with use of NTG patch. Then she was feeling so much better she took a 6 mile walk on the [place] and had recurrence of intensity of her pain. Not running right now.  REVIEW OF SYSTEMS:  No fever, no skin irritation at left heel. No headaches.  OBJECTIVE:  Vital signs are reviewed.   WD WN NAD Heel: LEFT" mildly ttp medial plantar heel. ANKLE: normal flexion and extension SKIN: no erythema or rash of left heel. GAIT: normal.  ASSESSMENT / PLAN: Please see problem oriented charting for details

## 2016-04-20 ENCOUNTER — Other Ambulatory Visit: Payer: Self-pay | Admitting: Orthopaedic Surgery

## 2016-04-20 DIAGNOSIS — M25512 Pain in left shoulder: Secondary | ICD-10-CM

## 2016-04-29 ENCOUNTER — Other Ambulatory Visit: Payer: BLUE CROSS/BLUE SHIELD

## 2016-05-28 ENCOUNTER — Ambulatory Visit
Admission: RE | Admit: 2016-05-28 | Discharge: 2016-05-28 | Disposition: A | Discharge status: Home / Self Care | Payer: 59 | Source: Ambulatory Visit | Attending: Orthopaedic Surgery | Admitting: Orthopaedic Surgery

## 2016-05-28 DIAGNOSIS — M25512 Pain in left shoulder: Secondary | ICD-10-CM

## 2016-05-28 DIAGNOSIS — M19012 Primary osteoarthritis, left shoulder: Secondary | ICD-10-CM | POA: Diagnosis not present

## 2016-05-31 DIAGNOSIS — M7502 Adhesive capsulitis of left shoulder: Secondary | ICD-10-CM | POA: Diagnosis not present

## 2016-06-14 DIAGNOSIS — D2262 Melanocytic nevi of left upper limb, including shoulder: Secondary | ICD-10-CM | POA: Diagnosis not present

## 2016-06-14 DIAGNOSIS — D225 Melanocytic nevi of trunk: Secondary | ICD-10-CM | POA: Diagnosis not present

## 2016-06-14 DIAGNOSIS — D2261 Melanocytic nevi of right upper limb, including shoulder: Secondary | ICD-10-CM | POA: Diagnosis not present

## 2016-07-07 DIAGNOSIS — M7502 Adhesive capsulitis of left shoulder: Secondary | ICD-10-CM | POA: Diagnosis not present

## 2016-08-04 DIAGNOSIS — M7502 Adhesive capsulitis of left shoulder: Secondary | ICD-10-CM | POA: Diagnosis not present

## 2016-08-12 DIAGNOSIS — M25512 Pain in left shoulder: Secondary | ICD-10-CM | POA: Diagnosis not present

## 2016-08-12 DIAGNOSIS — M7502 Adhesive capsulitis of left shoulder: Secondary | ICD-10-CM | POA: Diagnosis not present

## 2016-08-19 DIAGNOSIS — M7502 Adhesive capsulitis of left shoulder: Secondary | ICD-10-CM | POA: Diagnosis not present

## 2016-08-19 DIAGNOSIS — M25512 Pain in left shoulder: Secondary | ICD-10-CM | POA: Diagnosis not present

## 2016-08-23 DIAGNOSIS — M7502 Adhesive capsulitis of left shoulder: Secondary | ICD-10-CM | POA: Diagnosis not present

## 2016-08-23 DIAGNOSIS — M25512 Pain in left shoulder: Secondary | ICD-10-CM | POA: Diagnosis not present

## 2016-08-26 DIAGNOSIS — M25512 Pain in left shoulder: Secondary | ICD-10-CM | POA: Diagnosis not present

## 2016-08-26 DIAGNOSIS — M7502 Adhesive capsulitis of left shoulder: Secondary | ICD-10-CM | POA: Diagnosis not present

## 2016-08-30 DIAGNOSIS — M25512 Pain in left shoulder: Secondary | ICD-10-CM | POA: Diagnosis not present

## 2016-08-30 DIAGNOSIS — M7502 Adhesive capsulitis of left shoulder: Secondary | ICD-10-CM | POA: Diagnosis not present

## 2016-09-02 DIAGNOSIS — M7502 Adhesive capsulitis of left shoulder: Secondary | ICD-10-CM | POA: Diagnosis not present

## 2016-09-02 DIAGNOSIS — M25512 Pain in left shoulder: Secondary | ICD-10-CM | POA: Diagnosis not present

## 2016-09-07 DIAGNOSIS — M25512 Pain in left shoulder: Secondary | ICD-10-CM | POA: Diagnosis not present

## 2016-09-07 DIAGNOSIS — M7502 Adhesive capsulitis of left shoulder: Secondary | ICD-10-CM | POA: Diagnosis not present

## 2016-09-09 DIAGNOSIS — M7502 Adhesive capsulitis of left shoulder: Secondary | ICD-10-CM | POA: Diagnosis not present

## 2016-09-09 DIAGNOSIS — M25512 Pain in left shoulder: Secondary | ICD-10-CM | POA: Diagnosis not present

## 2016-09-13 DIAGNOSIS — M25512 Pain in left shoulder: Secondary | ICD-10-CM | POA: Diagnosis not present

## 2016-09-13 DIAGNOSIS — M7502 Adhesive capsulitis of left shoulder: Secondary | ICD-10-CM | POA: Diagnosis not present

## 2016-09-16 DIAGNOSIS — M25512 Pain in left shoulder: Secondary | ICD-10-CM | POA: Diagnosis not present

## 2016-09-16 DIAGNOSIS — M7502 Adhesive capsulitis of left shoulder: Secondary | ICD-10-CM | POA: Diagnosis not present

## 2016-09-20 DIAGNOSIS — M7502 Adhesive capsulitis of left shoulder: Secondary | ICD-10-CM | POA: Diagnosis not present

## 2016-09-20 DIAGNOSIS — M25512 Pain in left shoulder: Secondary | ICD-10-CM | POA: Diagnosis not present

## 2016-09-21 ENCOUNTER — Other Ambulatory Visit: Payer: Self-pay | Admitting: Obstetrics and Gynecology

## 2016-09-21 DIAGNOSIS — Z1231 Encounter for screening mammogram for malignant neoplasm of breast: Secondary | ICD-10-CM

## 2016-09-23 DIAGNOSIS — M7502 Adhesive capsulitis of left shoulder: Secondary | ICD-10-CM | POA: Diagnosis not present

## 2016-09-23 DIAGNOSIS — M25512 Pain in left shoulder: Secondary | ICD-10-CM | POA: Diagnosis not present

## 2016-09-27 DIAGNOSIS — M25512 Pain in left shoulder: Secondary | ICD-10-CM | POA: Diagnosis not present

## 2016-09-27 DIAGNOSIS — M7502 Adhesive capsulitis of left shoulder: Secondary | ICD-10-CM | POA: Diagnosis not present

## 2016-09-30 DIAGNOSIS — M7502 Adhesive capsulitis of left shoulder: Secondary | ICD-10-CM | POA: Diagnosis not present

## 2016-09-30 DIAGNOSIS — M25512 Pain in left shoulder: Secondary | ICD-10-CM | POA: Diagnosis not present

## 2016-10-05 DIAGNOSIS — M25512 Pain in left shoulder: Secondary | ICD-10-CM | POA: Diagnosis not present

## 2016-10-05 DIAGNOSIS — M7502 Adhesive capsulitis of left shoulder: Secondary | ICD-10-CM | POA: Diagnosis not present

## 2016-10-07 DIAGNOSIS — M25512 Pain in left shoulder: Secondary | ICD-10-CM | POA: Diagnosis not present

## 2016-10-07 DIAGNOSIS — M7502 Adhesive capsulitis of left shoulder: Secondary | ICD-10-CM | POA: Diagnosis not present

## 2016-10-11 DIAGNOSIS — M25512 Pain in left shoulder: Secondary | ICD-10-CM | POA: Diagnosis not present

## 2016-10-12 ENCOUNTER — Ambulatory Visit
Admission: RE | Admit: 2016-10-12 | Discharge: 2016-10-12 | Disposition: A | Discharge status: Home / Self Care | Payer: 59 | Source: Ambulatory Visit | Attending: Obstetrics and Gynecology | Admitting: Obstetrics and Gynecology

## 2016-10-12 DIAGNOSIS — Z1231 Encounter for screening mammogram for malignant neoplasm of breast: Secondary | ICD-10-CM

## 2016-11-01 DIAGNOSIS — G4709 Other insomnia: Secondary | ICD-10-CM | POA: Diagnosis not present

## 2016-11-01 DIAGNOSIS — Z6821 Body mass index (BMI) 21.0-21.9, adult: Secondary | ICD-10-CM | POA: Diagnosis not present

## 2016-11-01 DIAGNOSIS — G43909 Migraine, unspecified, not intractable, without status migrainosus: Secondary | ICD-10-CM | POA: Diagnosis not present

## 2016-11-05 ENCOUNTER — Other Ambulatory Visit (INDEPENDENT_AMBULATORY_CARE_PROVIDER_SITE_OTHER): Payer: Self-pay | Admitting: Orthopaedic Surgery

## 2016-11-05 MED ORDER — CYCLOBENZAPRINE HCL 10 MG PO TABS: 10.0000 mg | ORAL_TABLET | Freq: Three times a day (TID) | ORAL | 0 refills | Status: DC | PRN

## 2016-11-05 MED ORDER — METHYLPREDNISOLONE 4 MG PO TABS: ORAL_TABLET | ORAL | 0 refills | Status: DC

## 2017-02-05 DIAGNOSIS — Z23 Encounter for immunization: Secondary | ICD-10-CM | POA: Diagnosis not present

## 2017-06-09 DIAGNOSIS — S80211A Abrasion, right knee, initial encounter: Secondary | ICD-10-CM | POA: Diagnosis not present

## 2017-06-09 DIAGNOSIS — Z6822 Body mass index (BMI) 22.0-22.9, adult: Secondary | ICD-10-CM | POA: Diagnosis not present

## 2017-08-23 ENCOUNTER — Other Ambulatory Visit: Payer: Self-pay | Admitting: Obstetrics and Gynecology

## 2017-08-23 DIAGNOSIS — Z139 Encounter for screening, unspecified: Secondary | ICD-10-CM

## 2017-08-24 DIAGNOSIS — M65849 Other synovitis and tenosynovitis, unspecified hand: Secondary | ICD-10-CM | POA: Diagnosis not present

## 2017-08-24 DIAGNOSIS — Z6822 Body mass index (BMI) 22.0-22.9, adult: Secondary | ICD-10-CM | POA: Diagnosis not present

## 2017-09-19 DIAGNOSIS — R82998 Other abnormal findings in urine: Secondary | ICD-10-CM | POA: Diagnosis not present

## 2017-09-19 DIAGNOSIS — G43909 Migraine, unspecified, not intractable, without status migrainosus: Secondary | ICD-10-CM | POA: Diagnosis not present

## 2017-09-19 DIAGNOSIS — Z0000 Encounter for general adult medical examination without abnormal findings: Secondary | ICD-10-CM | POA: Diagnosis not present

## 2017-09-19 DIAGNOSIS — Z1389 Encounter for screening for other disorder: Secondary | ICD-10-CM | POA: Diagnosis not present

## 2017-09-19 DIAGNOSIS — M65849 Other synovitis and tenosynovitis, unspecified hand: Secondary | ICD-10-CM | POA: Diagnosis not present

## 2017-09-19 DIAGNOSIS — G4709 Other insomnia: Secondary | ICD-10-CM | POA: Diagnosis not present

## 2017-10-13 ENCOUNTER — Ambulatory Visit
Admission: RE | Admit: 2017-10-13 | Discharge: 2017-10-13 | Disposition: A | Discharge status: Home / Self Care | Payer: 59 | Source: Ambulatory Visit | Attending: Obstetrics and Gynecology | Admitting: Obstetrics and Gynecology

## 2017-10-13 DIAGNOSIS — Z1231 Encounter for screening mammogram for malignant neoplasm of breast: Secondary | ICD-10-CM | POA: Diagnosis not present

## 2017-10-13 DIAGNOSIS — Z139 Encounter for screening, unspecified: Secondary | ICD-10-CM

## 2018-01-12 DIAGNOSIS — Z6822 Body mass index (BMI) 22.0-22.9, adult: Secondary | ICD-10-CM | POA: Diagnosis not present

## 2018-01-12 DIAGNOSIS — Z01419 Encounter for gynecological examination (general) (routine) without abnormal findings: Secondary | ICD-10-CM | POA: Diagnosis not present

## 2018-02-03 DIAGNOSIS — R300 Dysuria: Secondary | ICD-10-CM | POA: Diagnosis not present

## 2018-02-03 DIAGNOSIS — L821 Other seborrheic keratosis: Secondary | ICD-10-CM | POA: Diagnosis not present

## 2018-02-03 DIAGNOSIS — D2261 Melanocytic nevi of right upper limb, including shoulder: Secondary | ICD-10-CM | POA: Diagnosis not present

## 2018-02-03 DIAGNOSIS — L573 Poikiloderma of Civatte: Secondary | ICD-10-CM | POA: Diagnosis not present

## 2018-07-20 DIAGNOSIS — Z6821 Body mass index (BMI) 21.0-21.9, adult: Secondary | ICD-10-CM | POA: Diagnosis not present

## 2018-07-20 DIAGNOSIS — G43909 Migraine, unspecified, not intractable, without status migrainosus: Secondary | ICD-10-CM | POA: Diagnosis not present

## 2018-08-29 ENCOUNTER — Ambulatory Visit (INDEPENDENT_AMBULATORY_CARE_PROVIDER_SITE_OTHER): Payer: BLUE CROSS/BLUE SHIELD | Admitting: Orthopaedic Surgery

## 2018-08-29 ENCOUNTER — Other Ambulatory Visit: Payer: Self-pay

## 2018-08-29 ENCOUNTER — Encounter (INDEPENDENT_AMBULATORY_CARE_PROVIDER_SITE_OTHER): Payer: Self-pay | Admitting: Orthopaedic Surgery

## 2018-08-29 ENCOUNTER — Ambulatory Visit (INDEPENDENT_AMBULATORY_CARE_PROVIDER_SITE_OTHER): Payer: Self-pay

## 2018-08-29 DIAGNOSIS — M25561 Pain in right knee: Secondary | ICD-10-CM | POA: Diagnosis not present

## 2018-08-29 MED ORDER — METHYLPREDNISOLONE ACETATE 40 MG/ML IJ SUSP
40.0000 mg | INTRAMUSCULAR | Status: AC | PRN
  Administered 2018-08-29: 11:00:00 40 mg via INTRA_ARTICULAR

## 2018-08-29 MED ORDER — LIDOCAINE HCL 1 % IJ SOLN
3.0000 mL | INTRAMUSCULAR | Status: AC | PRN
  Administered 2018-08-29: 3 mL

## 2018-08-29 NOTE — Progress Notes (Signed)
Office Visit Note   Patient: Chelsea Flores           Date of Birth: [birth year]           MRN: [id number] Visit Date: 08/29/2018              Requested by: Chelsea Flores, [address], [place] [id number] PCP: Chelsea Baton, MD   Assessment & Plan: Visit Diagnoses:  1. Right knee pain, unspecified chronicity     Plan: I did talk to Chelsea Flores about trying a steroid injection in her right knee and explained the rationale behind this.  She is agreeable to trying this as well and did tolerated.  All question concerns were answered and addressed.  If her right knee remains symptomatic I would want to obtain an MRI to rule out a stress fracture or meniscal tear.  She will let me know.  Follow-Up Instructions: Return if symptoms worsen or fail to improve.   Orders:  Orders Placed This Encounter  Procedures  . Large Joint Inj  . XR KNEE 3 VIEW RIGHT   No orders of the defined types were placed in this encounter.     Procedures: Large Joint Inj: R knee on 08/29/2018 11:13 AM Indications: diagnostic evaluation and pain Details: 22 G 1.5 in needle, superolateral approach  Arthrogram: No  Medications: 3 mL lidocaine 1 %; 40 mg methylPREDNISolone acetate 40 MG/ML Outcome: tolerated well, no immediate complications Procedure, treatment alternatives, risks and benefits explained, specific risks discussed. Consent was given by the patient. Immediately prior to procedure a time out was called to verify the correct patient, procedure, equipment, support staff and site/side marked as required. Patient was prepped and draped in the usual sterile fashion.       Clinical Data: No additional findings.   Subjective: Chief Complaint  Patient presents with  . Right Knee - Pain  Chelsea Flores is a very pleasant 48 year old runner who also happens to be my family's long-term at [facility].  I have known Chelsea Flores for very long period of time and she is very athletic individual.  Something is tweaked in  her knee over the weekend.  She points to the anterior lateral aspect of her right knee as a source for pain.  Weightbearing activities causes a significant bout of pain on the lateral aspect of her knee.  It is the joint line more so and deep in the knee as opposed to the IT band area.  She has not injured this knee before in terms of this area.  She had a mechanical fall about 2 weeks ago but did not have any issues with that knee until this past Saturday when this event occurred when she was pivoting her knee.  She does complain of pain in the knee when she hyperextends it as well.  She is concerned about a mechanical derangement.  HPI  Review of Systems She currently denies any headache, chest pain, shortness of breath, fever, chills, nausea, vomiting  Objective: Vital Signs: There were no vitals taken for this visit.  Physical Exam She is alert and oriented x3 and in no acute distress Ortho Exam Examination of her left knee shows no effusion.  She has lateral joint line tenderness.  There is no instability with varus and valgus stressing.  Her range of motion is full.  Her McMurray's and Lockman's exams are negative.  She hurts to palpation over the lateral femoral condyle and the tibial plateau. Specialty Comments:  No specialty  comments available.  Imaging: Xr Knee 3 View Right  Result Date: 08/29/2018 3 views of the right knee show no acute findings.  The joint space is very well-maintained.  There is only slight patellofemoral arthritic changes.  There is no effusion.    PMFS History: Patient Active Problem List   Diagnosis Date Noted  . Left foot pain 01/09/2016  . Hip pain 04/13/2012   History reviewed. No pertinent past medical history.  History reviewed. No pertinent family history.  History reviewed. No pertinent surgical history. Social History   Occupational History  . Not on file  Tobacco Use  . Smoking status: Never Smoker  . Smokeless tobacco: Never Used   Substance and Sexual Activity  . Alcohol use: Not on file  . Drug use: Not on file  . Sexual activity: Not on file

## 2018-09-13 ENCOUNTER — Other Ambulatory Visit: Payer: Self-pay | Admitting: Internal Medicine

## 2018-09-13 DIAGNOSIS — Z1231 Encounter for screening mammogram for malignant neoplasm of breast: Secondary | ICD-10-CM

## 2018-09-14 DIAGNOSIS — Z0000 Encounter for general adult medical examination without abnormal findings: Secondary | ICD-10-CM | POA: Diagnosis not present

## 2018-09-18 DIAGNOSIS — L659 Nonscarring hair loss, unspecified: Secondary | ICD-10-CM | POA: Diagnosis not present

## 2018-09-18 DIAGNOSIS — R82998 Other abnormal findings in urine: Secondary | ICD-10-CM | POA: Diagnosis not present

## 2018-09-18 DIAGNOSIS — R7989 Other specified abnormal findings of blood chemistry: Secondary | ICD-10-CM | POA: Diagnosis not present

## 2018-09-21 DIAGNOSIS — Z1331 Encounter for screening for depression: Secondary | ICD-10-CM | POA: Diagnosis not present

## 2018-09-21 DIAGNOSIS — R42 Dizziness and giddiness: Secondary | ICD-10-CM | POA: Diagnosis not present

## 2018-09-21 DIAGNOSIS — F418 Other specified anxiety disorders: Secondary | ICD-10-CM | POA: Diagnosis not present

## 2018-09-21 DIAGNOSIS — Z836 Family history of other diseases of the respiratory system: Secondary | ICD-10-CM | POA: Diagnosis not present

## 2018-09-21 DIAGNOSIS — M65849 Other synovitis and tenosynovitis, unspecified hand: Secondary | ICD-10-CM | POA: Diagnosis not present

## 2018-09-21 DIAGNOSIS — Z0000 Encounter for general adult medical examination without abnormal findings: Secondary | ICD-10-CM | POA: Diagnosis not present

## 2018-11-09 ENCOUNTER — Ambulatory Visit: Payer: Self-pay

## 2018-11-30 ENCOUNTER — Other Ambulatory Visit: Payer: Self-pay

## 2018-11-30 ENCOUNTER — Ambulatory Visit: Payer: Self-pay

## 2018-11-30 ENCOUNTER — Encounter: Payer: Self-pay | Admitting: Physician Assistant

## 2018-11-30 ENCOUNTER — Ambulatory Visit (INDEPENDENT_AMBULATORY_CARE_PROVIDER_SITE_OTHER): Payer: BLUE CROSS/BLUE SHIELD | Admitting: Physician Assistant

## 2018-11-30 DIAGNOSIS — M545 Low back pain, unspecified: Secondary | ICD-10-CM

## 2018-11-30 MED ORDER — METHYLPREDNISOLONE 4 MG PO TABS: ORAL_TABLET | ORAL | 0 refills | Status: DC

## 2018-11-30 MED ORDER — METHOCARBAMOL 500 MG PO TABS: 500.0000 mg | ORAL_TABLET | Freq: Three times a day (TID) | ORAL | 1 refills | Status: DC

## 2018-11-30 NOTE — Progress Notes (Signed)
Office Visit Note   Patient: Chelsea Flores           Date of Birth: 1970/10/26           MRN: [id number] Visit Date: 11/30/2018              Requested by: Shon Baton, [address],  [place] [id number] PCP: Shon Baton, MD   Assessment & Plan: Visit Diagnoses:  1. Acute low back pain, unspecified back pain laterality, unspecified whether sciatica present     Plan: Place her on a Medrol Dosepak and Robaxin.  Have her apply moist heat low back.  She is given back exercises handout.  She is going out [place] in the next week or so and if she returns and still having problems with her lower back or she develops any radicular symptoms she can call the office and we will obtain an MRI to rule out HNP as source of low back pain.   Follow-Up Instructions: Return if symptoms worsen or fail to improve.   Orders:  Orders Placed This Encounter  Procedures   XR Lumbar Spine 2-3 Views   Meds ordered this encounter  Medications   methocarbamol (ROBAXIN) 500 MG tablet    Sig: Take 1 tablet (500 mg total) by mouth 3 (three) times daily.    Dispense:  40 tablet    Refill:  1   methylPREDNISolone (MEDROL) 4 MG tablet    Sig: Take as directed    Dispense:  21 tablet    Refill:  0      Procedures: No procedures performed   Clinical Data: No additional findings.   Subjective: Chief Complaint  Patient presents with   Lower Back - Pain    HPI [name] comes in today for low back pain.  She states that pain began 10 days ago when she was moving something in her son's room picked it up and had pain in the low back.  Has had flareups in the past and seen Dr. Ninfa Linden.  She been taking some leftover methocarbamol and meloxicam that she had and that is helped some.  She has had no bowel bladder dysfunction no saddle anesthesia.  She does have some waking pain.  Pain is low back with some subjective decreased sensation at times in the buttocks region and upper thighs of both  legs.  She has had spasms on and off.  Review of Systems  Constitutional: Negative for chills and fever.  Respiratory: Negative for shortness of breath.      Objective: Vital Signs: There were no vitals taken for this visit.  Physical Exam Constitutional:      Appearance: She is not ill-appearing or diaphoretic.  Pulmonary:     Effort: Pulmonary effort is normal.  Neurological:     Mental Status: She is alert and oriented to person, place, and time.     Ortho Exam Lower extremity she has 5/5 strength throughout the lower extremities against resistance.  Negative straight leg raise bilaterally.  Exquisitely tight hamstrings bilaterally.  Good range of motion of both hips without pain.  Deep tendon reflexes are 2+ at the knees and ankles and equal and symmetric. Specialty Comments:  No specialty comments available.  Imaging: Xr Lumbar Spine 2-3 Views  Result Date: 11/30/2018 AP and lateral lumbar: Disc space overall well-maintained.  No acute fracture.  Normal lordotic curvature.  No spondylolisthesis.    PMFS History: Patient Active Problem List   Diagnosis Date  Noted   Left foot pain 01/09/2016   Hip pain 04/13/2012   No past medical history on file.  No family history on file.  No past surgical history on file. Social History   Occupational History   Not on file  Tobacco Use   Smoking status: Never Smoker   Smokeless tobacco: Never Used  Substance and Sexual Activity   Alcohol use: Not on file   Drug use: Not on file   Sexual activity: Not on file

## 2018-12-28 DIAGNOSIS — R634 Abnormal weight loss: Secondary | ICD-10-CM | POA: Diagnosis not present

## 2018-12-28 DIAGNOSIS — R194 Change in bowel habit: Secondary | ICD-10-CM | POA: Diagnosis not present

## 2018-12-28 DIAGNOSIS — Z8371 Family history of colonic polyps: Secondary | ICD-10-CM | POA: Diagnosis not present

## 2018-12-28 DIAGNOSIS — Z1211 Encounter for screening for malignant neoplasm of colon: Secondary | ICD-10-CM | POA: Diagnosis not present

## 2019-01-11 ENCOUNTER — Other Ambulatory Visit: Payer: Self-pay

## 2019-01-11 ENCOUNTER — Ambulatory Visit
Admission: RE | Admit: 2019-01-11 | Discharge: 2019-01-11 | Disposition: A | Discharge status: Home / Self Care | Payer: BLUE CROSS/BLUE SHIELD | Source: Ambulatory Visit | Attending: Internal Medicine | Admitting: Internal Medicine

## 2019-01-11 DIAGNOSIS — Z1231 Encounter for screening mammogram for malignant neoplasm of breast: Secondary | ICD-10-CM

## 2019-01-20 DIAGNOSIS — Z23 Encounter for immunization: Secondary | ICD-10-CM | POA: Diagnosis not present

## 2019-01-26 DIAGNOSIS — R634 Abnormal weight loss: Secondary | ICD-10-CM | POA: Diagnosis not present

## 2019-01-26 DIAGNOSIS — K259 Gastric ulcer, unspecified as acute or chronic, without hemorrhage or perforation: Secondary | ICD-10-CM | POA: Diagnosis not present

## 2019-01-26 DIAGNOSIS — K625 Hemorrhage of anus and rectum: Secondary | ICD-10-CM | POA: Diagnosis not present

## 2019-01-26 DIAGNOSIS — K319 Disease of stomach and duodenum, unspecified: Secondary | ICD-10-CM | POA: Diagnosis not present

## 2019-01-26 DIAGNOSIS — R194 Change in bowel habit: Secondary | ICD-10-CM | POA: Diagnosis not present

## 2019-01-26 DIAGNOSIS — K635 Polyp of colon: Secondary | ICD-10-CM | POA: Diagnosis not present

## 2019-01-26 DIAGNOSIS — Z1211 Encounter for screening for malignant neoplasm of colon: Secondary | ICD-10-CM | POA: Diagnosis not present

## 2019-01-26 DIAGNOSIS — D125 Benign neoplasm of sigmoid colon: Secondary | ICD-10-CM | POA: Diagnosis not present

## 2019-01-26 DIAGNOSIS — D122 Benign neoplasm of ascending colon: Secondary | ICD-10-CM | POA: Diagnosis not present

## 2019-02-15 DIAGNOSIS — K219 Gastro-esophageal reflux disease without esophagitis: Secondary | ICD-10-CM | POA: Diagnosis not present

## 2019-02-15 DIAGNOSIS — Z86010 Personal history of colonic polyps: Secondary | ICD-10-CM | POA: Diagnosis not present

## 2019-02-15 DIAGNOSIS — R194 Change in bowel habit: Secondary | ICD-10-CM | POA: Diagnosis not present

## 2019-02-22 ENCOUNTER — Telehealth: Payer: Self-pay | Admitting: Orthopaedic Surgery

## 2019-02-22 NOTE — Telephone Encounter (Signed)
Patient called left voicemail message asking if Dr Ninfa Linden would call her. Patient said she has a stomach ulcer and was told to stop taking nsaids. Patient said she is taking methocarbamol and need to know if there is something else she can take? The number to contact patient is [phone]

## 2019-02-22 NOTE — Telephone Encounter (Signed)
See below

## 2019-02-22 NOTE — Telephone Encounter (Signed)
I spoke with her in detail.  She absolutely needs an MRI of her lumbar spine at this point to rule out herniated disc.  Please order that study.  Thanks

## 2019-02-23 ENCOUNTER — Other Ambulatory Visit: Payer: Self-pay

## 2019-02-23 ENCOUNTER — Other Ambulatory Visit: Payer: Self-pay | Admitting: Orthopaedic Surgery

## 2019-02-23 DIAGNOSIS — M4807 Spinal stenosis, lumbosacral region: Secondary | ICD-10-CM

## 2019-02-23 MED ORDER — ACETAMINOPHEN-CODEINE #3 300-30 MG PO TABS: 1.0000 | ORAL_TABLET | Freq: Three times a day (TID) | ORAL | 0 refills | Status: DC | PRN

## 2019-02-23 NOTE — Telephone Encounter (Signed)
Order in for MRI 

## 2019-02-26 ENCOUNTER — Encounter: Payer: Self-pay | Admitting: Orthopaedic Surgery

## 2019-02-26 ENCOUNTER — Ambulatory Visit (INDEPENDENT_AMBULATORY_CARE_PROVIDER_SITE_OTHER): Payer: BLUE CROSS/BLUE SHIELD | Admitting: Orthopaedic Surgery

## 2019-02-26 ENCOUNTER — Other Ambulatory Visit: Payer: Self-pay

## 2019-02-26 DIAGNOSIS — G8929 Other chronic pain: Secondary | ICD-10-CM | POA: Diagnosis not present

## 2019-02-26 DIAGNOSIS — M5442 Lumbago with sciatica, left side: Secondary | ICD-10-CM | POA: Diagnosis not present

## 2019-02-26 DIAGNOSIS — M4807 Spinal stenosis, lumbosacral region: Secondary | ICD-10-CM

## 2019-02-26 DIAGNOSIS — M5441 Lumbago with sciatica, right side: Secondary | ICD-10-CM

## 2019-02-26 NOTE — Progress Notes (Signed)
The patient is a 48 year old female well-known to me.  We have been seeing her for many years now as relates to her low back pain and sciatic pain.  She is also had pain in the posterior pelvis and SI joints.  She has been through numerous rounds of physical therapy.  She has been on anti-inflammatories for some time but now has been advised by her GI doctors to stop the anti-inflammatories because she is developing gastric distress and an ulcer.  She is a thin individual and is very athletic.  She is an avid runner.  At this point she has tried and failed conservative treatment for well over a year.  This includes activity modification, formal physical therapy, anti-inflammatories (which she can now not take) rest, ice, heat and deep tissue massage.  She still reports significantly tight hamstrings and low back pain that is quite uncomfortable to her.  She denies any weakness in her legs and denies any numbness and tingling in her feet.  On exam she does have bilateral sciatica.  She has significantly tight hamstrings and low back pain with flexion and extension.  Previous spine films were reviewed again and are unremarkable.  At this point given the failure of all forms of conservative treatment I do feel it is medically necessary that an MRI be obtained of her lumbar spine total assess her degree of stenosis that she may have as well as a rule out nerve impingement given her continued pain and the failure of treatment for well over a year now.  We will work on ordering this MRI.  All question concerns were answered addressed.  We will see her in follow-up after the MRI.

## 2019-02-28 ENCOUNTER — Other Ambulatory Visit: Payer: Self-pay

## 2019-02-28 ENCOUNTER — Telehealth: Payer: Self-pay | Admitting: Orthopaedic Surgery

## 2019-02-28 MED ORDER — DIAZEPAM 5 MG PO TABS: ORAL_TABLET | ORAL | 0 refills | Status: DC

## 2019-02-28 NOTE — Telephone Encounter (Signed)
Patient left a message requesting the medication to be called into [facility] on [address] and [address].  CB#[phone].  Thank you.

## 2019-02-28 NOTE — Telephone Encounter (Signed)
Patient called and left a message that she is scheduled to have a MRI on 11/8. She would like some medication called in for her to take before the MRI. Her call back number is [phone]

## 2019-02-28 NOTE — Telephone Encounter (Signed)
Called into pharmacy

## 2019-03-18 ENCOUNTER — Ambulatory Visit
Admission: RE | Admit: 2019-03-18 | Discharge: 2019-03-18 | Disposition: A | Discharge status: Home / Self Care | Payer: BLUE CROSS/BLUE SHIELD | Source: Ambulatory Visit | Attending: Orthopaedic Surgery | Admitting: Orthopaedic Surgery

## 2019-03-18 ENCOUNTER — Other Ambulatory Visit: Payer: Self-pay

## 2019-03-18 DIAGNOSIS — M4807 Spinal stenosis, lumbosacral region: Secondary | ICD-10-CM

## 2019-03-18 DIAGNOSIS — M48061 Spinal stenosis, lumbar region without neurogenic claudication: Secondary | ICD-10-CM | POA: Diagnosis not present

## 2019-03-19 DIAGNOSIS — Z01419 Encounter for gynecological examination (general) (routine) without abnormal findings: Secondary | ICD-10-CM | POA: Diagnosis not present

## 2019-03-19 DIAGNOSIS — Z6821 Body mass index (BMI) 21.0-21.9, adult: Secondary | ICD-10-CM | POA: Diagnosis not present

## 2019-03-19 DIAGNOSIS — F5104 Psychophysiologic insomnia: Secondary | ICD-10-CM | POA: Diagnosis not present

## 2019-03-22 ENCOUNTER — Ambulatory Visit (INDEPENDENT_AMBULATORY_CARE_PROVIDER_SITE_OTHER): Payer: BLUE CROSS/BLUE SHIELD | Admitting: Orthopaedic Surgery

## 2019-03-22 ENCOUNTER — Other Ambulatory Visit: Payer: Self-pay

## 2019-03-22 ENCOUNTER — Encounter: Payer: Self-pay | Admitting: Orthopaedic Surgery

## 2019-03-22 DIAGNOSIS — M5442 Lumbago with sciatica, left side: Secondary | ICD-10-CM

## 2019-03-22 DIAGNOSIS — M5441 Lumbago with sciatica, right side: Secondary | ICD-10-CM

## 2019-03-22 DIAGNOSIS — G8929 Other chronic pain: Secondary | ICD-10-CM | POA: Diagnosis not present

## 2019-03-22 NOTE — Progress Notes (Signed)
Analisha comes in today to go over an MRI of her lumbar spine.  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  She is on her feet all day long.  She has been having significant low back pain that is been getting worse.  She is not able to take anti-inflammatories now due to significant gastritis and the beginnings of an ulcer.  Most of her pain is at the lower aspect of the lumbar spine.  MRI is reviewed with her and it does show a small annular disc fissure at L4-L5 with a small protrusion.  There is no evidence of neural compromise.  The remaining aspect of her lumbar spine appears normal.  I do see a cyst type of structure at the sacrum that the radiologist cannot comment on.  I will follow up on that with her.  I have recommended outpatient physical therapy for her back for any modalities that can help improve her back function and decrease her pain.  We will send her to [facility] physical therapy for this.  All question concerns were answered and addressed.  Follow-up will be as needed.  I know her well personally and she understands if there is any issues she can text or call me and I will get her seen again.

## 2019-04-10 ENCOUNTER — Telehealth: Payer: Self-pay | Admitting: Orthopaedic Surgery

## 2019-04-10 NOTE — Telephone Encounter (Signed)
They need to work on her Lumbar spine, any modalities

## 2019-04-10 NOTE — Telephone Encounter (Signed)
How would you like me to write the order and I will send it for PT here at [facility]

## 2019-04-10 NOTE — Telephone Encounter (Signed)
Patient called. She would like to come here for her PT. Please put a referral in for PT at [facility]. Thanks

## 2019-04-11 ENCOUNTER — Other Ambulatory Visit: Payer: Self-pay

## 2019-04-11 DIAGNOSIS — M5441 Lumbago with sciatica, right side: Secondary | ICD-10-CM

## 2019-04-11 NOTE — Telephone Encounter (Signed)
Order sent.

## 2019-04-18 ENCOUNTER — Ambulatory Visit (INDEPENDENT_AMBULATORY_CARE_PROVIDER_SITE_OTHER): Payer: BLUE CROSS/BLUE SHIELD | Admitting: Physical Therapy

## 2019-04-18 ENCOUNTER — Encounter: Payer: Self-pay | Admitting: Physical Therapy

## 2019-04-18 ENCOUNTER — Other Ambulatory Visit: Payer: Self-pay

## 2019-04-18 DIAGNOSIS — M545 Low back pain: Secondary | ICD-10-CM | POA: Diagnosis not present

## 2019-04-18 DIAGNOSIS — M6281 Muscle weakness (generalized): Secondary | ICD-10-CM

## 2019-04-18 DIAGNOSIS — G8929 Other chronic pain: Secondary | ICD-10-CM | POA: Diagnosis not present

## 2019-04-18 NOTE — Patient Instructions (Signed)
Access Code: [email or web address]  URL: [email or web address]  Date: 04/18/2019  Prepared by: Elsie Ra   Exercises  Standing Lumbar Extension - 10 reps - 1-3 sets - 5 hold - 2x daily - 6x weekly  Prone Press Up on Elbows - 10 reps - 2-3 sets - 5 hold - 2x daily - 6x weekly  Sidelying Thoracic Lumbar Rotation - 10 reps - 1 sets - 5 hold - 2x daily - 6x weekly  Sidelying Hip Abduction - 10 reps - 1-3 sets - 2x daily - 6x weekly  Supine Active Straight Leg Raise - 10 reps - 1-2 sets - 2x daily - 6x weekly  Prone Hip Extension - 10 reps - 3 sets - 2x daily - 6x weekly  Hip Flexor Stretch with Chair - 3 sets - 30 hold - 2x daily - 6x weekly

## 2019-04-18 NOTE — Therapy (Signed)
[facility] [address] [place], [place], [address] Phone: [phone]   Fax:  [phone]  Physical Therapy Evaluation  Patient Details  Name: Chelsea Flores MRN: [id number] Date of Birth: 28-Jun-1970 Referring Provider (PT): Jean Rosenthal, MD   Encounter Date: 04/18/2019  PT End of Session - 04/18/19 2200    Visit Number  1    Number of Visits  12    Date for PT Re-Evaluation  06/13/19    Authorization Type  BCBS    PT Start Time  1450    PT Stop Time  1535    PT Time Calculation (min)  45 min    Activity Tolerance  Patient tolerated treatment well    Behavior During Therapy  [facility] for tasks assessed/performed       History reviewed. No pertinent past medical history.  History reviewed. No pertinent surgical history.  There were no vitals filed for this visit.   Subjective Assessment - 04/18/19 2141    Subjective  Chronic back Pain for 25 years, worse with standing in one place or bending over. 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 Only occasional radiculopathy. She is avid runner and mostly runs without pain but will have pain afterwords or the next day.    Pertinent History  no significant PMH    Limitations  Lifting;Standing    How long can you sit comfortably?  not limited    How long can you stand comfortably?  depends can be an hour can be only 10 min    How long can you walk comfortably?  depends    Diagnostic tests  "MRI is reviewed with her and it does show a small annular disc fissure at L4-L5 with a small protrusion.  There is no evidence of neural compromise.  The remaining aspect of her lumbar spine appears normal."    Patient Stated Goals  reduce overall pain and manage it better    Currently in Pain?  Yes    Pain Score  3    varries from no pain to 9/10   Pain Location  Back    Pain Orientation  Right;Left;Lower    Pain Descriptors / Indicators  Aching;Tightness    Pain  Radiating Towards  only occassional pain in her legs most of the time does not go past her hips    Pain Onset  More than a month ago    Pain Frequency  Intermittent    Aggravating Factors   standing in one place too long, slumping over to perform surgeries    Pain Relieving Factors  NSAIDs but cant take them much because causes stomach ulcers    Effect of Pain on Daily Activities  limites work abilites    Multiple Pain Sites  No         [facility] PT Assessment - 04/18/19 0001      Assessment   Medical Diagnosis  Chronic LBP    Referring Provider (PT)  Jean Rosenthal, MD    Onset Date/Surgical Date  --   chronic LBP for 25 years   Next MD Visit  PRN      Precautions   Precautions  None      Balance Screen   Has the patient fallen in the past 6 months  No      [place] residence      Prior Function   Level  of Independence  Independent    Vocation  Full time employment    Vocation Requirements  Vet    Leisure  running      Cognition   Overall Cognitive Status  Within Functional Limits for tasks assessed      Posture/Postural Control   Posture Comments  WFL maybe slight trunk shift to Lt      ROM / Strength   AROM / PROM / Strength  AROM;Strength      AROM   AROM Assessment Site  Lumbar    Lumbar Flexion  WNL   no pain   Lumbar Extension  50%   pain at end range   Lumbar - Right Side Bend  50%    Lumbar - Left Side Bend  50%    Lumbar - Right Rotation  50%    Lumbar - Left Rotation  75%      Strength   Overall Strength Comments  bilat hip strength overall 4/5 MMT all planes, knee strength 5/5 bilat      Flexibility   Soft Tissue Assessment /Muscle Length  --   mildly tight lumbar P.S, glutes, hip flexors, hamstings     Palpation   Spinal mobility  hypomobility lumbar segments    Palpation comment  TTP lumbar spine and paraspinals, glutes      Special Tests   Other special tests  +slump test bilat, neg SLR, neg  FABERS, some relief with long axis distraction. May favor extension but not having radiculopaty today so hard to tell.      Transfers   Transfers  Independent with all Transfers      Ambulation/Gait   Gait Comments  WNL                Objective measurements completed on examination: See above findings.      [facility] Adult PT Treatment/Exercise - 04/18/19 0001      Modalities   Modalities  Moist Heat;Electrical Stimulation      Moist Heat Therapy   Number Minutes Moist Heat  10 Minutes    Moist Heat Location  Lumbar Spine      Electrical Stimulation   Electrical Stimulation Location  lumbar    Electrical Stimulation Action  IFC    Electrical Stimulation Parameters  tolerance    Electrical Stimulation Goals  Pain      Manual Therapy   Manual therapy comments  lumbar rotational mobilizations Grade 4-5, central PA lumbar mobs with pt in prone. long axis distraction unilat on both sides,              PT Education - 04/18/19 2159    Education Details  HEP, POC, exam findings    Person(s) Educated  Patient    Methods  Explanation;Demonstration;Verbal cues;Handout    Comprehension  Verbalized understanding;Need further instruction       PT Short Term Goals - 04/18/19 2207      PT SHORT TERM GOAL #1   Title  Pt will be I and compliant with HEP (4 weeks 05/19/19)    Status  New        PT Long Term Goals - 04/18/19 2208      PT LONG TERM GOAL #1   Title  Pt will reduce overall pain at work and stading at least 2-3 hours to less than 3/10. (Target for all LTG 8 weeks 06/13/19)    Status  New      PT LONG TERM GOAL #2  Title  Pt will improve lumbar ROM to WNL.    Status  New      PT LONG TERM GOAL #3   Title  Pt will improve hip strength to 5/5 MMT to improve funciton.    Status  New      PT LONG TERM GOAL #4   Title  Pt will be able to hold standard plank 30 seconds and side plank 20 seconds to show improved core strength.    Status  New              Plan - 04/18/19 2201    Clinical Impression Statement  Pt presents with Chronic LBP for last 25 years. MRI shows "show a small annular disc fissure at L4-L5 with a small protrusion.  There is no evidence of neural compromise.  The remaining aspect of her lumbar spine appears normal." Pain mostly exacerabated by standing in one place too long or slumping over as when she performs surgeries as a Psychologist, clinical. She has overall decerased hip strength, deceased core/lumbar strength, increased soft tissue restrictions and tightness, and decreased lumbar-thoracic mobility. She will benefit from skilled PT to address her deficits.    Personal Factors and Comorbidities  Time since onset of injury/illness/exacerbation    Examination-Activity Limitations  Bend;Lift;Stand    Examination-Participation Restrictions  --   work or lifting activities   Stability/Clinical Decision Making  Stable/Uncomplicated    Clinical Decision Making  Moderate    Rehab Potential  Good    PT Frequency  2x / week   1-2   PT Duration  8 weeks    PT Treatment/Interventions  ADLs/Self Care Home Management;Aquatic Therapy;Cryotherapy;Electrical Stimulation;Iontophoresis 4mg /ml Dexamethasone;Moist Heat;Traction;Ultrasound;Therapeutic activities;Therapeutic exercise;Balance training;Neuromuscular re-education;Manual techniques;Passive range of motion;Dry needling;Joint Manipulations;Spinal Manipulations;Taping    PT Next Visit Plan  may favor extension, did respond well to long axis distraction, needs hip/core strength, consider DN and modalties for pain PRN    PT Home Exercise Plan  Access Code: [email or web address]    Consulted and Agree with Plan of Care  Patient       Patient will benefit from skilled therapeutic intervention in order to improve the following deficits and impairments:  Decreased activity tolerance, Decreased mobility, Decreased range of motion, Decreased strength, Hypomobility, Impaired flexibility, Increased muscle  spasms, Increased fascial restricitons, Pain  Visit Diagnosis: Chronic bilateral low back pain without sciatica - Plan: PT plan of care cert/re-cert  Muscle weakness (generalized) - Plan: PT plan of care cert/re-cert     Problem List Patient Active Problem List   Diagnosis Date Noted  . Left foot pain 01/09/2016  . Hip pain 04/13/2012    [name] 04/18/2019, 10:13 PM  [facility] [address] [place], [place], [address] Phone: [phone]   Fax:  [phone]  Name: Chelsea Flores MRN: [id number] Date of Birth: 08-29-1970

## 2019-04-20 DIAGNOSIS — D2261 Melanocytic nevi of right upper limb, including shoulder: Secondary | ICD-10-CM | POA: Diagnosis not present

## 2019-04-20 DIAGNOSIS — L573 Poikiloderma of Civatte: Secondary | ICD-10-CM | POA: Diagnosis not present

## 2019-04-20 DIAGNOSIS — D2262 Melanocytic nevi of left upper limb, including shoulder: Secondary | ICD-10-CM | POA: Diagnosis not present

## 2019-04-20 DIAGNOSIS — D225 Melanocytic nevi of trunk: Secondary | ICD-10-CM | POA: Diagnosis not present

## 2019-05-10 ENCOUNTER — Other Ambulatory Visit: Payer: Self-pay

## 2019-05-10 ENCOUNTER — Encounter: Payer: Self-pay | Admitting: Physical Therapy

## 2019-05-10 ENCOUNTER — Ambulatory Visit: Payer: BLUE CROSS/BLUE SHIELD | Admitting: Physical Therapy

## 2019-05-10 DIAGNOSIS — M545 Low back pain, unspecified: Secondary | ICD-10-CM

## 2019-05-10 DIAGNOSIS — M6281 Muscle weakness (generalized): Secondary | ICD-10-CM | POA: Diagnosis not present

## 2019-05-10 DIAGNOSIS — G8929 Other chronic pain: Secondary | ICD-10-CM

## 2019-05-10 NOTE — Therapy (Signed)
[facility] [address] [place], [place], [address] Phone: [phone]   Fax:  [phone]  Physical Therapy Treatment  Patient Details  Name: Chelsea Flores MRN: [id number] Date of Birth: Jun 21, 1970 Referring Provider (PT): Jean Rosenthal, MD   Encounter Date: 05/10/2019  PT End of Session - 05/10/19 1106    Visit Number  2    Number of Visits  12    Date for PT Re-Evaluation  06/13/19    Authorization Type  BCBS    PT Start Time  1015    PT Stop Time  1055    PT Time Calculation (min)  40 min    Activity Tolerance  Patient tolerated treatment well    Behavior During Therapy  [facility] for tasks assessed/performed       History reviewed. No pertinent past medical history.  History reviewed. No pertinent surgical history.  There were no vitals filed for this visit.  Subjective Assessment - 05/10/19 1020    Subjective  having mostly good days; yesterday felt back tighten up and spasm    Pertinent History  no significant PMH    Limitations  Lifting;Standing    How long can you sit comfortably?  not limited    How long can you stand comfortably?  depends can be an hour can be only 10 min    How long can you walk comfortably?  depends    Diagnostic tests  "MRI is reviewed with her and it does show a small annular disc fissure at L4-L5 with a small protrusion.  There is no evidence of neural compromise.  The remaining aspect of her lumbar spine appears normal."    Patient Stated Goals  reduce overall pain and manage it better    Currently in Pain?  No/denies    Pain Onset  More than a month ago                       [facility] Adult PT Treatment/Exercise - 05/10/19 1021      Exercises   Exercises  Lumbar      Lumbar Exercises: Stretches   Other Lumbar Stretch Exercise  standing QL stretch x30 sec bil; trial sitting as well    Other Lumbar Stretch Exercise  discussed and demonstrated dynamic stretches to perform prior to  running to help with pain - pt verbalized understanding      Lumbar Exercises: Aerobic   Tread Mill  3.0 mph x 5 min      Manual Therapy   Manual Therapy  Soft tissue mobilization    Manual therapy comments  skilld palpation and monitoring of soft tissue during DN    Soft tissue mobilization  bil QL       Trigger Point Dry Needling - 05/10/19 1105    Consent Given?  Yes    Education Handout Provided  No   will give next visit - has had DN before   Muscles Treated Back/Hip  Quadratus lumborum    Quadratus Lumborum Response  Twitch response elicited;Palpable increased muscle length           PT Education - 05/10/19 1105    Education Details  verbally reviewed DN, discussed dynamic stretching and QL stretch    Person(s) Educated  Patient    Methods  Explanation;Demonstration;Handout    Comprehension  Verbalized understanding;Returned demonstration;Tactile cues required       PT Short Term Goals - 04/18/19 2207  PT SHORT TERM GOAL #1   Title  Pt will be I and compliant with HEP (4 weeks 05/19/19)    Status  New        PT Long Term Goals - 04/18/19 2208      PT LONG TERM GOAL #1   Title  Pt will reduce overall pain at work and stading at least 2-3 hours to less than 3/10. (Target for all LTG 8 weeks 06/13/19)    Status  New      PT LONG TERM GOAL #2   Title  Pt will improve lumbar ROM to WNL.    Status  New      PT LONG TERM GOAL #3   Title  Pt will improve hip strength to 5/5 MMT to improve funciton.    Status  New      PT LONG TERM GOAL #4   Title  Pt will be able to hold standard plank 30 seconds and side plank 20 seconds to show improved core strength.    Status  New            Plan - 05/10/19 1106    Clinical Impression Statement  Pt with positive response to DN today and overall reports minimal change to date.  Will continue to benefit from PT to maximize function. No goals met as only 2nd visit.    Personal Factors and Comorbidities  Time since  onset of injury/illness/exacerbation    Examination-Activity Limitations  Bend;Lift;Stand    Examination-Participation Restrictions  --   work or lifting activities   Stability/Clinical Decision Making  Stable/Uncomplicated    Rehab Potential  Good    PT Frequency  2x / week   1-2   PT Duration  8 weeks    PT Treatment/Interventions  ADLs/Self Care Home Management;Aquatic Therapy;Cryotherapy;Electrical Stimulation;Iontophoresis 63m/ml Dexamethasone;Moist Heat;Traction;Ultrasound;Therapeutic activities;Therapeutic exercise;Balance training;Neuromuscular re-education;Manual techniques;Passive range of motion;Dry needling;Joint Manipulations;Spinal Manipulations;Taping    PT Next Visit Plan  may favor extension, did respond well to long axis distraction, needs hip/core strength, assess response to DN    PT Home Exercise Plan  Access Code: [email or web address]    Consulted and Agree with Plan of Care  Patient       Patient will benefit from skilled therapeutic intervention in order to improve the following deficits and impairments:  Decreased activity tolerance, Decreased mobility, Decreased range of motion, Decreased strength, Hypomobility, Impaired flexibility, Increased muscle spasms, Increased fascial restricitons, Pain  Visit Diagnosis: Chronic bilateral low back pain without sciatica  Muscle weakness (generalized)     Problem List Patient Active Problem List   Diagnosis Date Noted  . Left foot pain 01/09/2016  . Hip pain 04/13/2012        SLaureen Abrahams PT, DPT 05/10/19 11:10 AM     [facility] [address] [address]Phone: [phone]  Fax:  [phone] Name: Chelsea POPOCAMRN: [id number] of Birth: 1Nov 02, 1972

## 2019-05-18 ENCOUNTER — Encounter: Payer: Self-pay | Admitting: Physical Therapy

## 2019-05-18 ENCOUNTER — Other Ambulatory Visit: Payer: Self-pay

## 2019-05-18 ENCOUNTER — Ambulatory Visit: Payer: BLUE CROSS/BLUE SHIELD | Admitting: Physical Therapy

## 2019-05-18 DIAGNOSIS — M545 Low back pain, unspecified: Secondary | ICD-10-CM

## 2019-05-18 DIAGNOSIS — G8929 Other chronic pain: Secondary | ICD-10-CM

## 2019-05-18 DIAGNOSIS — M6281 Muscle weakness (generalized): Secondary | ICD-10-CM | POA: Diagnosis not present

## 2019-05-18 NOTE — Therapy (Signed)
[facility] [address] [place], [place], [address] Phone: [phone]   Fax:  [phone]  Physical Therapy Treatment  Patient Details  Name: Chelsea Flores MRN: [id number] Date of Birth: 09-25-1970 Referring Provider (PT): Jean Rosenthal, MD   Encounter Date: 05/18/2019  PT End of Session - 05/18/19 1101    Visit Number  3    Number of Visits  12    Date for PT Re-Evaluation  06/13/19    Authorization Type  BCBS    PT Start Time  0805    PT Stop Time  0840    PT Time Calculation (min)  35 min    Activity Tolerance  Patient tolerated treatment well    Behavior During Therapy  [facility] for tasks assessed/performed       History reviewed. No pertinent past medical history.  History reviewed. No pertinent surgical history.  There were no vitals filed for this visit.  Subjective Assessment - 05/18/19 0831    Subjective  having some mild pain    Pertinent History  no significant PMH    Limitations  Lifting;Standing    How long can you sit comfortably?  not limited    How long can you stand comfortably?  depends can be an hour can be only 10 min    How long can you walk comfortably?  depends    Diagnostic tests  "MRI is reviewed with her and it does show a small annular disc fissure at L4-L5 with a small protrusion.  There is no evidence of neural compromise.  The remaining aspect of her lumbar spine appears normal."    Patient Stated Goals  reduce overall pain and manage it better    Pain Onset  More than a month ago                       [facility] Adult PT Treatment/Exercise - 05/18/19 1058      Lumbar Exercises: Aerobic   Tread Mill  3.0 mph x 5 min      Lumbar Exercises: Supine   Bridge  10 reps    Bridge Limitations  with strap for isometric hip abduction    Other Supine Lumbar Exercises  performed LLE isometric extension with RLE isometric flexion 5x5 sec; isometric hip abduction 5x5 sec      Lumbar Exercises:  Sidelying   Other Sidelying Lumbar Exercises  demonstrated glute med activation exercises - pt verbalized understanding      Manual Therapy   Manual Therapy  Muscle Energy Technique    Muscle Energy Technique  Rt sidelying contract/relax of Lt hip abduction/external rotation 2 sets; 10 x 5 sec              PT Education - 05/18/19 1100    Education Details  HEP for SI stabilization    Person(s) Educated  Patient    Methods  Explanation;Demonstration;Handout    Comprehension  Verbalized understanding;Returned demonstration       PT Short Term Goals - 05/18/19 1102      PT SHORT TERM GOAL #1   Title  Pt will be I and compliant with HEP (4 weeks 05/19/19)    Status  Achieved        PT Long Term Goals - 04/18/19 2208      PT LONG TERM GOAL #1   Title  Pt will reduce overall pain at work and stading at least 2-3 hours to less than 3/10. (Target  for all LTG 8 weeks 06/13/19)    Status  New      PT LONG TERM GOAL #2   Title  Pt will improve lumbar ROM to WNL.    Status  New      PT LONG TERM GOAL #3   Title  Pt will improve hip strength to 5/5 MMT to improve funciton.    Status  New      PT LONG TERM GOAL #4   Title  Pt will be able to hold standard plank 30 seconds and side plank 20 seconds to show improved core strength.    Status  New            Plan - 05/18/19 1102    Clinical Impression Statement  Pt with increased pain with extension based HEP currently.  Discussed neutral exercises for SI stabilization that may help and to hold on extension based exercises for now.  Pt with Rt on Lt posterior sacral rotation so muscle energy techniques utilized ot help with symptoms.  Will continue to benefit from PT to maximize function.    Personal Factors and Comorbidities  Time since onset of injury/illness/exacerbation    Examination-Activity Limitations  Bend;Lift;Stand    Examination-Participation Restrictions  --   work or lifting activities   Stability/Clinical  Decision Making  Stable/Uncomplicated    Rehab Potential  Good    PT Frequency  2x / week   1-2   PT Duration  8 weeks    PT Treatment/Interventions  ADLs/Self Care Home Management;Aquatic Therapy;Cryotherapy;Electrical Stimulation;Iontophoresis 4mg /ml Dexamethasone;Moist Heat;Traction;Ultrasound;Therapeutic activities;Therapeutic exercise;Balance training;Neuromuscular re-education;Manual techniques;Passive range of motion;Dry needling;Joint Manipulations;Spinal Manipulations;Taping    PT Next Visit Plan  see how SI stab going; reasses; needs hip/core strength, assess response to DN    PT Home Exercise Plan  Access Code: [email or web address]    Consulted and Agree with Plan of Care  Patient       Patient will benefit from skilled therapeutic intervention in order to improve the following deficits and impairments:  Decreased activity tolerance, Decreased mobility, Decreased range of motion, Decreased strength, Hypomobility, Impaired flexibility, Increased muscle spasms, Increased fascial restricitons, Pain  Visit Diagnosis: Chronic bilateral low back pain without sciatica  Muscle weakness (generalized)     Problem List Patient Active Problem List   Diagnosis Date Noted  . Left foot pain 01/09/2016  . Hip pain 04/13/2012      [name], PT, DPT 05/18/19 11:05 AM     [facility] Physical Therapy [address] [place], [place], [address] Phone: [phone]   Fax:  [phone]  Name: Chelsea Flores MRN: [id number] Date of Birth: 1970-09-29

## 2019-05-18 NOTE — Patient Instructions (Addendum)
--  Sidelying isometric clams with strap 10 x 5 sec  --Sidelying hip abduction with hip extension; pulse at end range x 30 sec  --Supine bridge with strap 10 x 5 sec  --Isometric co-contraction hip flexion/hip extension alternate legs 10 x 5 sec

## 2019-05-21 ENCOUNTER — Other Ambulatory Visit: Payer: Self-pay

## 2019-05-21 ENCOUNTER — Ambulatory Visit: Payer: BLUE CROSS/BLUE SHIELD | Admitting: Physical Therapy

## 2019-05-21 DIAGNOSIS — M545 Low back pain, unspecified: Secondary | ICD-10-CM

## 2019-05-21 DIAGNOSIS — G8929 Other chronic pain: Secondary | ICD-10-CM

## 2019-05-21 DIAGNOSIS — M6281 Muscle weakness (generalized): Secondary | ICD-10-CM

## 2019-05-21 NOTE — Therapy (Signed)
[facility] Physical Therapy [address] [place], [place], [address] Phone: [phone]   Fax:  [phone]  Physical Therapy Treatment  Patient Details  Name: Chelsea Flores MRN: [id number] Date of Birth: 03-11-1971 Referring Provider (PT): Jean Rosenthal, MD   Encounter Date: 05/21/2019  PT End of Session - 05/21/19 1139    Visit Number  4    Number of Visits  12    Date for PT Re-Evaluation  06/13/19    Authorization Type  BCBS    PT Start Time  6384    PT Stop Time  1102    PT Time Calculation (min)  47 min    Activity Tolerance  Patient tolerated treatment well    Behavior During Therapy  [facility] for tasks assessed/performed       No past medical history on file.  No past surgical history on file.  There were no vitals filed for this visit.  Subjective Assessment - 05/21/19 1127    Subjective  having some mild pain near SI joint, have only been walking recently and not running         [facility] PT Assessment - 05/21/19 0001      Assessment   Medical Diagnosis  Chronic LBP    Referring Provider (PT)  Jean Rosenthal, MD      Strength   Overall Strength Comments  bilat hip strength overall 4/5 MMT hip flexion and extension ,4+ for hip abd knee strength 5/5 bilat                   [facility] Adult PT Treatment/Exercise - 05/21/19 0001      Lumbar Exercises: Aerobic   Tread Mill  3.0 mph x 5 min      Lumbar Exercises: Supine   Bridge  10 reps    Bridge Limitations  with strap for isometric hip abduction    Straight Leg Raises Limitations  10 reps with bilateral slow eccentric lower with cues to not arch her low back    Other Supine Lumbar Exercises  performed LLE isometric extension with RLE isometric flexion 5x5 sec; isometric hip abduction 5x5 sec      Lumbar Exercises: Sidelying   Other Sidelying Lumbar Exercises  Lt hip MET for resisted clamshell 5 sec 2X10, with isometric contraction using strap laying in Rt sidelying       Lumbar Exercises: Quadruped   Straight Leg Raise  10 reps      Manual Therapy   Manual Therapy  Muscle Energy Technique    Manual therapy comments  STM/IASTM to lumbar P.S and QL. Lumbar central PA mobs to lumbar-sacral segments and Rt SI jt in prone, then moved to supine for A-P mobs to Lt SI joint and long axis distraction.    Muscle Energy Technique  Rt sidelying contract/relax of Lt hip abduction/external rotation 1 set; 10 x 5 sec             PT Education - 05/21/19 1138    Education Details  progressed HEP adding in SL RDL reaches and monster walks    Person(s) Educated  Patient    Methods  Explanation;Demonstration;Verbal cues   denies needing handout   Comprehension  Verbalized understanding       PT Short Term Goals - 05/18/19 1102      PT SHORT TERM GOAL #1   Title  Pt will be I and compliant with HEP (4 weeks 05/19/19)    Status  Achieved  PT Long Term Goals - 04/18/19 2208      PT LONG TERM GOAL #1   Title  Pt will reduce overall pain at work and stading at least 2-3 hours to less than 3/10. (Target for all LTG 8 weeks 06/13/19)    Status  New      PT LONG TERM GOAL #2   Title  Pt will improve lumbar ROM to WNL.    Status  New      PT LONG TERM GOAL #3   Title  Pt will improve hip strength to 5/5 MMT to improve funciton.    Status  New      PT LONG TERM GOAL #4   Title  Pt will be able to hold standard plank 30 seconds and side plank 20 seconds to show improved core strength.    Status  New            Plan - 05/21/19 1139    Clinical Impression Statement  Continued with SI/core stability today and added in Single leg posterior chain strength into her HEP including single leg RDL, and monster/lateral walking with resistance. She was treated with manual therapy for STM, and mobs to lumbar,hip,SI joint. Continued with MET to correct Rt on Lt posterior sacral rotation. Continue POC.    Personal Factors and Comorbidities  Time since onset of  injury/illness/exacerbation    Examination-Activity Limitations  Bend;Lift;Stand    Examination-Participation Restrictions  --   work or lifting activities   Stability/Clinical Decision Making  Stable/Uncomplicated    Rehab Potential  Good    PT Frequency  2x / week   1-2   PT Duration  8 weeks    PT Treatment/Interventions  ADLs/Self Care Home Management;Aquatic Therapy;Cryotherapy;Electrical Stimulation;Iontophoresis 65m/ml Dexamethasone;Moist Heat;Traction;Ultrasound;Therapeutic activities;Therapeutic exercise;Balance training;Neuromuscular re-education;Manual techniques;Passive range of motion;Dry needling;Joint Manipulations;Spinal Manipulations;Taping    PT Next Visit Plan  see how SI stab going; reasses; needs hip/core strength, assess response to DN    PT Home Exercise Plan  Access Code: [email or web address]    Consulted and Agree with Plan of Care  Patient       Patient will benefit from skilled therapeutic intervention in order to improve the following deficits and impairments:  Decreased activity tolerance, Decreased mobility, Decreased range of motion, Decreased strength, Hypomobility, Impaired flexibility, Increased muscle spasms, Increased fascial restricitons, Pain  Visit Diagnosis: Chronic bilateral low back pain without sciatica  Muscle weakness (generalized)     Problem List Patient Active Problem List   Diagnosis Date Noted  . Left foot pain 01/09/2016  . Hip pain 04/13/2012    BSilvestre Mesi1/03/2020, 1:15 PM  [facility] [address] [address]Phone: [phone]  Fax:  [phone] Name: Chelsea KNUDTSONMRN: [id number] of Birth: 101/20/1972

## 2019-05-24 ENCOUNTER — Other Ambulatory Visit: Payer: Self-pay

## 2019-05-24 ENCOUNTER — Ambulatory Visit: Payer: BLUE CROSS/BLUE SHIELD | Admitting: Physical Therapy

## 2019-05-24 DIAGNOSIS — M6281 Muscle weakness (generalized): Secondary | ICD-10-CM

## 2019-05-24 DIAGNOSIS — G8929 Other chronic pain: Secondary | ICD-10-CM

## 2019-05-24 DIAGNOSIS — M545 Low back pain: Secondary | ICD-10-CM

## 2019-05-24 NOTE — Therapy (Signed)
[facility] Physical Therapy [address] [place], [place], [address] Phone: [phone]   Fax:  [phone]  Physical Therapy Treatment  Patient Details  Name: Chelsea Flores MRN: [id number] Date of Birth: 1970/12/06 Referring Provider (PT): Jean Rosenthal, MD   Encounter Date: 05/24/2019  PT End of Session - 05/24/19 2152    Visit Number  5    Number of Visits  12    Date for PT Re-Evaluation  06/13/19    Authorization Type  BCBS    PT Start Time  1020    PT Stop Time  1100    PT Time Calculation (min)  40 min    Activity Tolerance  Patient tolerated treatment well    Behavior During Therapy  [facility] for tasks assessed/performed       No past medical history on file.  No past surgical history on file.  There were no vitals filed for this visit.  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 Rates her pain 5/10, denies any pain down her leg    Diagnostic tests  "MRI is reviewed with her and it does show a small annular disc fissure at L4-L5 with a small protrusion.  There is no evidence of neural compromise.  The remaining aspect of her lumbar spine appears normal."    Patient Stated Goals  reduce overall pain and manage it better                       [facility] Adult PT Treatment/Exercise - 05/24/19 0001      Lumbar Exercises: Stretches   Piriformis Stretch  Left;2 reps;30 seconds    Other Lumbar Stretch Exercise  child pose 30 sec X 2, then 30 sec X 2 with arms to the Rt      Lumbar Exercises: Aerobic   Tread Mill  3.0 mph x 5 min      Lumbar Exercises: Standing   Other Standing Lumbar Exercises  single leg anti rotation with UE's X 15 reps ea side with L3 band.     Other Standing Lumbar Exercises  Lateral step up crossovers 8 inch X 10 bilat then standing curtsey lunge step  downs off 8 inch step X 10 reps ea side, KB swings 10 lbs X 20 reps with cues for proper technique and hip hinge.      Lumbar Exercises: Prone   Other Prone Lumbar Exercises  POE stretching for 2 min then prone press ups with overpressure 2X10 reps      Manual Therapy   Manual therapy comments  central PA mobs lower lumbar and upper sacral segments grade 2-3, Lt leg LAD with foot in ER then IR    Muscle Energy Technique  Rt sidelying contract/relax of Lt hip abduction/external rotation 1 set; 10 x 5 sec               PT Short Term Goals - 05/18/19 1102      PT SHORT TERM GOAL #1   Title  Pt will be I and compliant with HEP (4 weeks 05/19/19)    Status  Achieved        PT Long Term Goals - 05/24/19 2157      PT LONG TERM GOAL #1   Title  Pt will reduce  overall pain at work and stading at least 2-3 hours to less than 3/10. (Target for all LTG 8 weeks 06/13/19)    Status  On-going      PT LONG TERM GOAL #2   Title  Pt will improve lumbar ROM to WNL.    Status  Achieved      PT LONG TERM GOAL #3   Title  Pt will improve hip strength to 5/5 MMT to improve funciton.    Status  On-going      PT LONG TERM GOAL #4   Title  Pt will be able to hold standard plank 30 seconds and side plank 20 seconds to show improved core strength.    Status  On-going            Plan - 05/24/19 2153    Clinical Impression Statement  Lt SI joint pain flared up today but had good tolerance to session without any increased pain. Her lumbar ROM now looks good. Continued to focus on SI stability and glute/lumbar strengthening and she was able to progress her intensity with this. Pain appears to be more intermittent in nature and may be overall improving but then she still gets flared up with standing on her feet for long periods of time at work. PT will continue to address her deficits and monitor her symptoms/progress.    Personal Factors and Comorbidities  Time since onset of  injury/illness/exacerbation    Examination-Activity Limitations  Bend;Lift;Stand    Examination-Participation Restrictions  --   work or lifting activities   Stability/Clinical Decision Making  Stable/Uncomplicated    Rehab Potential  Good    PT Frequency  2x / week   1-2   PT Duration  8 weeks    PT Treatment/Interventions  ADLs/Self Care Home Management;Aquatic Therapy;Cryotherapy;Electrical Stimulation;Iontophoresis 4mg /ml Dexamethasone;Moist Heat;Traction;Ultrasound;Therapeutic activities;Therapeutic exercise;Balance training;Neuromuscular re-education;Manual techniques;Passive range of motion;Dry needling;Joint Manipulations;Spinal Manipulations;Taping    PT Next Visit Plan  see how SI stab going; reasses; needs hip/core strength, assess response to DN    PT Home Exercise Plan  Access Code: [email or web address]    Consulted and Agree with Plan of Care  Patient       Patient will benefit from skilled therapeutic intervention in order to improve the following deficits and impairments:  Decreased activity tolerance, Decreased mobility, Decreased range of motion, Decreased strength, Hypomobility, Impaired flexibility, Increased muscle spasms, Increased fascial restricitons, Pain  Visit Diagnosis: Chronic bilateral low back pain without sciatica  Muscle weakness (generalized)     Problem List Patient Active Problem List   Diagnosis Date Noted  . Left foot pain 01/09/2016  . Hip pain 04/13/2012    Chelsea Flores 05/24/2019, 10:04 PM  [facility] Physical Therapy [address] [place], [place], [address] Phone: [phone]   Fax:  [phone]  Name: Chelsea Flores MRN: [id number] Date of Birth: 01-Mar-1971

## 2019-05-25 DIAGNOSIS — F329 Major depressive disorder, single episode, unspecified: Secondary | ICD-10-CM | POA: Diagnosis not present

## 2019-05-25 DIAGNOSIS — G4700 Insomnia, unspecified: Secondary | ICD-10-CM | POA: Diagnosis not present

## 2019-05-25 DIAGNOSIS — F418 Other specified anxiety disorders: Secondary | ICD-10-CM | POA: Diagnosis not present

## 2019-05-28 ENCOUNTER — Encounter: Payer: Self-pay | Admitting: Physical Therapy

## 2019-05-28 ENCOUNTER — Ambulatory Visit: Payer: BLUE CROSS/BLUE SHIELD | Admitting: Physical Therapy

## 2019-05-28 ENCOUNTER — Other Ambulatory Visit: Payer: Self-pay

## 2019-05-28 DIAGNOSIS — M545 Low back pain, unspecified: Secondary | ICD-10-CM

## 2019-05-28 DIAGNOSIS — M6281 Muscle weakness (generalized): Secondary | ICD-10-CM

## 2019-05-28 DIAGNOSIS — G8929 Other chronic pain: Secondary | ICD-10-CM | POA: Diagnosis not present

## 2019-05-28 NOTE — Therapy (Signed)
[facility] Physical Therapy [address] [place], [place], [address] Phone: [phone]   Fax:  [phone]  Physical Therapy Treatment  Patient Details  Name: Chelsea Flores MRN: [id number] Date of Birth: Feb 10, 1971 Referring Provider (PT): Jean Rosenthal, MD   Encounter Date: 05/28/2019  PT End of Session - 05/28/19 1357    Visit Number  6    Number of Visits  12    Date for PT Re-Evaluation  06/13/19    Authorization Type  BCBS    PT Start Time  1320    PT Stop Time  1353    PT Time Calculation (min)  33 min    Activity Tolerance  Patient tolerated treatment well    Behavior During Therapy  [facility] for tasks assessed/performed       History reviewed. No pertinent past medical history.  History reviewed. No pertinent surgical history.  There were no vitals filed for this visit.  Subjective Assessment - 05/28/19 1354    Subjective  doing okay, still feels like pain is anterior to lumbar spine and hips.    Diagnostic tests  "MRI is reviewed with her and it does show a small annular disc fissure at L4-L5 with a small protrusion.  There is no evidence of neural compromise.  The remaining aspect of her lumbar spine appears normal."    Patient Stated Goals  reduce overall pain and manage it better    Currently in Pain?  No/denies                       [facility] Adult PT Treatment/Exercise - 05/28/19 1355      Lumbar Exercises: Stretches   Hip Flexor Stretch  Right;3 reps;30 seconds    Prone on Elbows Stretch  2 reps;60 seconds    Other Lumbar Stretch Exercise  prone press up into child's pose with decreased pain following DN/manual      Lumbar Exercises: Aerobic   Tread Mill  3.0 mph x 5 min      Manual Therapy   Manual Therapy  Joint mobilization    Manual therapy comments  skilled palpation and monitoring of soft tissue during DN    Joint Mobilization  CPA and bil UPA mobs grades 2-3 L3-5       Trigger Point Dry Needling -  05/28/19 1356    Consent Given?  Yes    Education Handout Provided  Previously provided    Muscles Treated Back/Hip  Lumbar multifidi    Lumbar multifidi Response  Twitch response elicited   with decreased tightness following            PT Short Term Goals - 05/18/19 1102      PT SHORT TERM GOAL #1   Title  Pt will be I and compliant with HEP (4 weeks 05/19/19)    Status  Achieved        PT Long Term Goals - 05/24/19 2157      PT LONG TERM GOAL #1   Title  Pt will reduce overall pain at work and stading at least 2-3 hours to less than 3/10. (Target for all LTG 8 weeks 06/13/19)    Status  On-going      PT LONG TERM GOAL #2   Title  Pt will improve lumbar ROM to WNL.    Status  Achieved      PT LONG TERM GOAL #3   Title  Pt will improve hip strength to 5/5  MMT to improve funciton.    Status  On-going      PT LONG TERM GOAL #4   Title  Pt will be able to hold standard plank 30 seconds and side plank 20 seconds to show improved core strength.    Status  On-going            Plan - 05/28/19 1358    Clinical Impression Statement  Pt with positive response to DN and manual therapy today noting decreased tightness.  May have some deep iliacus/pectineus tightness but deferred DN there today.  Will continue to benefit from PT to maximize function.    Personal Factors and Comorbidities  Time since onset of injury/illness/exacerbation    Examination-Activity Limitations  Bend;Lift;Stand    Examination-Participation Restrictions  --   work or lifting activities   Stability/Clinical Decision Making  Stable/Uncomplicated    Rehab Potential  Good    PT Frequency  2x / week   1-2   PT Duration  8 weeks    PT Treatment/Interventions  ADLs/Self Care Home Management;Aquatic Therapy;Cryotherapy;Electrical Stimulation;Iontophoresis 4mg /ml Dexamethasone;Moist Heat;Traction;Ultrasound;Therapeutic activities;Therapeutic exercise;Balance training;Neuromuscular re-education;Manual  techniques;Passive range of motion;Dry needling;Joint Manipulations;Spinal Manipulations;Taping    PT Next Visit Plan  see how SI stab going; reasses; needs hip/core strength, assess response to DN, hip flexor stretch/release with strengthening    PT Home Exercise Plan  Access Code: [email or web address]    Consulted and Agree with Plan of Care  Patient       Patient will benefit from skilled therapeutic intervention in order to improve the following deficits and impairments:  Decreased activity tolerance, Decreased mobility, Decreased range of motion, Decreased strength, Hypomobility, Impaired flexibility, Increased muscle spasms, Increased fascial restricitons, Pain  Visit Diagnosis: Chronic bilateral low back pain without sciatica  Muscle weakness (generalized)     Problem List Patient Active Problem List   Diagnosis Date Noted  . Left foot pain 01/09/2016  . Hip pain 04/13/2012      [name], PT, DPT 05/28/19 2:00 PM     [facility] Physical Therapy [address] [place], [place], [address] Phone: [phone]   Fax:  [phone]  Name: Chelsea Flores MRN: [id number] Date of Birth: [birth year]

## 2019-06-04 ENCOUNTER — Ambulatory Visit: Payer: BLUE CROSS/BLUE SHIELD | Admitting: Physical Therapy

## 2019-06-04 ENCOUNTER — Other Ambulatory Visit: Payer: Self-pay

## 2019-06-04 DIAGNOSIS — G8929 Other chronic pain: Secondary | ICD-10-CM | POA: Diagnosis not present

## 2019-06-04 DIAGNOSIS — M545 Low back pain, unspecified: Secondary | ICD-10-CM

## 2019-06-04 DIAGNOSIS — M6281 Muscle weakness (generalized): Secondary | ICD-10-CM | POA: Diagnosis not present

## 2019-06-04 NOTE — Therapy (Signed)
[facility] Physical Therapy [address] [place], [place], [address] Phone: [phone]   Fax:  [phone]  Physical Therapy Treatment  Patient Details  Name: Chelsea Flores MRN: [id number] Date of Birth: 1970-06-16 Referring Provider (PT): Jean Rosenthal, MD   Encounter Date: 06/04/2019  PT End of Session - 06/04/19 1122    Visit Number  7    Number of Visits  12    Date for PT Re-Evaluation  06/13/19    Authorization Type  BCBS    PT Start Time  1105    PT Stop Time  1145    PT Time Calculation (min)  40 min    Activity Tolerance  Patient tolerated treatment well    Behavior During Therapy  [facility] for tasks assessed/performed       No past medical history on file.  No past surgical history on file.  There were no vitals filed for this visit.  Subjective Assessment - 06/04/19 1120    Subjective  Her back feels overall better however she has not had to work for a few days. She does relay some tightness centrally in her back and soreness in her hamstrings    Pertinent History  no significant PMH    Limitations  Lifting;Standing    How long can you sit comfortably?  not limited    How long can you stand comfortably?  depends can be an hour can be only 10 min    How long can you walk comfortably?  depends    Diagnostic tests  "MRI is reviewed with her and it does show a small annular disc fissure at L4-L5 with a small protrusion.  There is no evidence of neural compromise.  The remaining aspect of her lumbar spine appears normal."    Patient Stated Goals  reduce overall pain and manage it better    Currently in Pain?  Other (Comment)   does not rate pain intensity today   Pain Onset  More than a month ago                       [facility] Adult PT Treatment/Exercise - 06/04/19 0001      Lumbar Exercises: Stretches   Active Hamstring Stretch  Right;Left;2 reps;30 seconds    Hip Flexor Stretch  Right;Left;2 reps;30 seconds    Prone on  Elbows Stretch  3 reps;60 seconds    Press Ups  10 reps    Quadruped Mid Back Stretch Limitations  child pose 30 sec X 2 reps fwd, then one rep to Lt, one rep to Rt      Lumbar Exercises: Aerobic   Tread Mill  3.0 mph x 5 min      Lumbar Exercises: Sidelying   Other Sidelying Lumbar Exercises  sideplank 20 sec X 3 reps ea side      Lumbar Exercises: Prone   Other Prone Lumbar Exercises  plank 30 sec X 3 reps      Lumbar Exercises: Quadruped   Other Quadruped Lumbar Exercises  birddog progression of touching knee to elbow X 10 reps each side.      Manual Therapy   Joint Mobilization  central PA mobs thoraco-lumbar grade 2-3, 60 sec bout X 2 each level.               PT Short Term Goals - 05/18/19 1102      PT SHORT TERM GOAL #1   Title  Pt will be I  and compliant with HEP (4 weeks 05/19/19)    Status  Achieved        PT Long Term Goals - 05/24/19 2157      PT LONG TERM GOAL #1   Title  Pt will reduce overall pain at work and stading at least 2-3 hours to less than 3/10. (Target for all LTG 8 weeks 06/13/19)    Status  On-going      PT LONG TERM GOAL #2   Title  Pt will improve lumbar ROM to WNL.    Status  Achieved      PT LONG TERM GOAL #3   Title  Pt will improve hip strength to 5/5 MMT to improve funciton.    Status  On-going      PT LONG TERM GOAL #4   Title  Pt will be able to hold standard plank 30 seconds and side plank 20 seconds to show improved core strength.    Status  On-going            Plan - 06/04/19 1227    Clinical Impression Statement  She appears to be progressing with core strenght, hip/lumbar strength, and overall decreased back pain however pain levels are still up and down. Continued with manual therapy for spinal mobility along with progression of core exercises today with good tolerance. PT will continue to progress as able.    Personal Factors and Comorbidities  Time since onset of injury/illness/exacerbation     Examination-Activity Limitations  Bend;Lift;Stand    Examination-Participation Restrictions  --   work or lifting activities   Stability/Clinical Decision Making  Stable/Uncomplicated    Rehab Potential  Good    PT Frequency  2x / week   1-2   PT Duration  8 weeks    PT Treatment/Interventions  ADLs/Self Care Home Management;Aquatic Therapy;Cryotherapy;Electrical Stimulation;Iontophoresis 4mg /ml Dexamethasone;Moist Heat;Traction;Ultrasound;Therapeutic activities;Therapeutic exercise;Balance training;Neuromuscular re-education;Manual techniques;Passive range of motion;Dry needling;Joint Manipulations;Spinal Manipulations;Taping    PT Next Visit Plan  see how SI stab going; reasses; needs hip/core strength, assess response to DN, hip flexor stretch/release with strengthening    PT Home Exercise Plan  Access Code: [email or web address]    Consulted and Agree with Plan of Care  Patient       Patient will benefit from skilled therapeutic intervention in order to improve the following deficits and impairments:  Decreased activity tolerance, Decreased mobility, Decreased range of motion, Decreased strength, Hypomobility, Impaired flexibility, Increased muscle spasms, Increased fascial restricitons, Pain  Visit Diagnosis: Chronic bilateral low back pain without sciatica  Muscle weakness (generalized)     Problem List Patient Active Problem List   Diagnosis Date Noted  . Left foot pain 01/09/2016  . Hip pain 04/13/2012    [name] 06/04/2019, 12:46 PM  [facility] Physical Therapy [address] [place], [place], [address] Phone: [phone]   Fax:  [phone]  Name: Chelsea Flores MRN: [id number] Date of Birth: 02-14-1971

## 2019-06-07 ENCOUNTER — Encounter: Payer: Self-pay | Admitting: Physical Therapy

## 2019-06-07 ENCOUNTER — Other Ambulatory Visit: Payer: Self-pay

## 2019-06-07 ENCOUNTER — Ambulatory Visit (INDEPENDENT_AMBULATORY_CARE_PROVIDER_SITE_OTHER): Payer: BLUE CROSS/BLUE SHIELD | Admitting: Physical Therapy

## 2019-06-07 DIAGNOSIS — M6281 Muscle weakness (generalized): Secondary | ICD-10-CM | POA: Diagnosis not present

## 2019-06-07 DIAGNOSIS — G8929 Other chronic pain: Secondary | ICD-10-CM | POA: Diagnosis not present

## 2019-06-07 DIAGNOSIS — M545 Low back pain: Secondary | ICD-10-CM | POA: Diagnosis not present

## 2019-06-07 NOTE — Therapy (Signed)
[name] Physical Therapy [address] [place], [place], [address] Phone: [phone]   Fax:  [phone]  Physical Therapy Treatment  Patient Details  Name: Chelsea Flores MRN: [id number] Date of Birth: 24-Aug-1970 Referring Provider (PT): Jean Rosenthal, MD   Encounter Date: 06/07/2019  PT End of Session - 06/07/19 0935    Visit Number  8    Number of Visits  12    Date for PT Re-Evaluation  06/13/19    Authorization Type  BCBS    PT Start Time  0850    PT Stop Time  0928    PT Time Calculation (min)  38 min    Activity Tolerance  Patient tolerated treatment well    Behavior During Therapy  [name] for tasks assessed/performed       History reviewed. No pertinent past medical history.  History reviewed. No pertinent surgical history.  There were no vitals filed for this visit.  Subjective Assessment - 06/07/19 0853    Subjective  had some surgeries yesterday so on her feet more, back is improved but still troublesome.    Pertinent History  no significant PMH    Limitations  Lifting;Standing    How long can you sit comfortably?  not limited    How long can you stand comfortably?  depends can be an hour can be only 10 min    How long can you walk comfortably?  depends    Diagnostic tests  "MRI is reviewed with her and it does show a small annular disc fissure at L4-L5 with a small protrusion.  There is no evidence of neural compromise.  The remaining aspect of her lumbar spine appears normal."    Patient Stated Goals  reduce overall pain and manage it better    Currently in Pain?  Yes    Pain Score  1     Pain Location  Back    Pain Orientation  Right;Left;Lower    Pain Onset  More than a month ago                       [name] Adult PT Treatment/Exercise - 06/07/19 0854      Lumbar Exercises: Stretches   Active Hamstring Stretch  Right;Left;2 reps;30 seconds    Active Hamstring Stretch Limitations  supine with strap    Hip  Flexor Stretch  Right;Left;2 reps;30 seconds      Lumbar Exercises: Aerobic   Tread Mill  3.0 mph x 5 min      Lumbar Exercises: Standing   Functional Squats  10 reps;5 seconds   TRX   Other Standing Lumbar Exercises  TRX SL deadlift x10 bil      Lumbar Exercises: Supine   Bridge  10 reps    Bridge Limitations  on red physioball with hamstring curls    Other Supine Lumbar Exercises  green physioball walk out with 5 sec hold x 10 reps      Lumbar Exercises: Prone   Other Prone Lumbar Exercises  prone hip press with hamstring curl x 10 reps bil      Lumbar Exercises: Quadruped   Other Quadruped Lumbar Exercises  birddog progression of touching knee to elbow X 15 reps each side.               PT Short Term Goals - 05/18/19 1102      PT SHORT TERM GOAL #1   Title  Pt will be I and compliant  with HEP (4 weeks 05/19/19)    Status  Achieved        PT Long Term Goals - 05/24/19 2157      PT LONG TERM GOAL #1   Title  Pt will reduce overall pain at work and stading at least 2-3 hours to less than 3/10. (Target for all LTG 8 weeks 06/13/19)    Status  On-going      PT LONG TERM GOAL #2   Title  Pt will improve lumbar ROM to WNL.    Status  Achieved      PT LONG TERM GOAL #3   Title  Pt will improve hip strength to 5/5 MMT to improve funciton.    Status  On-going      PT LONG TERM GOAL #4   Title  Pt will be able to hold standard plank 30 seconds and side plank 20 seconds to show improved core strength.    Status  On-going            Plan - 06/07/19 1122    Clinical Impression Statement  [name] reports overall improvement in pain and symptoms continue to decrease overall.  Session today focused on core strengthening.  May benefit from additional DN sessions to lumbar multifidi.  Pt feels she will be ready to hold PT after next week if she continues to do well.    Personal Factors and Comorbidities  Time since onset of injury/illness/exacerbation     Examination-Activity Limitations  Bend;Lift;Stand    Examination-Participation Restrictions  --   work or lifting activities   Stability/Clinical Decision Making  Stable/Uncomplicated    Rehab Potential  Good    PT Frequency  2x / week   1-2   PT Duration  8 weeks    PT Treatment/Interventions  ADLs/Self Care Home Management;Aquatic Therapy;Cryotherapy;Electrical Stimulation;Iontophoresis 4mg /ml Dexamethasone;Moist Heat;Traction;Ultrasound;Therapeutic activities;Therapeutic exercise;Balance training;Neuromuscular re-education;Manual techniques;Passive range of motion;Dry needling;Joint Manipulations;Spinal Manipulations;Taping    PT Next Visit Plan  see how SI stab going; reasses; needs hip/core strength, assess response to DN, hip flexor stretch/release with strengthening    PT Home Exercise Plan  Access Code: [email or web address]    Consulted and Agree with Plan of Care  Patient       Patient will benefit from skilled therapeutic intervention in order to improve the following deficits and impairments:  Decreased activity tolerance, Decreased mobility, Decreased range of motion, Decreased strength, Hypomobility, Impaired flexibility, Increased muscle spasms, Increased fascial restricitons, Pain  Visit Diagnosis: Chronic bilateral low back pain without sciatica  Muscle weakness (generalized)     Problem List Patient Active Problem List   Diagnosis Date Noted  . Left foot pain 01/09/2016  . Hip pain 04/13/2012     [name], PT, DPT 06/07/19 11:25 AM    [facility] Physical Therapy [address] [place], [place], [address] Phone: [phone]   Fax:  [phone]  Name: Chelsea Flores MRN: [id number] Date of Birth: January 13, 1971

## 2019-06-11 ENCOUNTER — Ambulatory Visit (INDEPENDENT_AMBULATORY_CARE_PROVIDER_SITE_OTHER): Payer: BLUE CROSS/BLUE SHIELD | Admitting: Physical Therapy

## 2019-06-11 ENCOUNTER — Other Ambulatory Visit: Payer: Self-pay

## 2019-06-11 ENCOUNTER — Encounter: Payer: Self-pay | Admitting: Physical Therapy

## 2019-06-11 DIAGNOSIS — M6281 Muscle weakness (generalized): Secondary | ICD-10-CM

## 2019-06-11 DIAGNOSIS — G8929 Other chronic pain: Secondary | ICD-10-CM | POA: Diagnosis not present

## 2019-06-11 DIAGNOSIS — M545 Low back pain: Secondary | ICD-10-CM

## 2019-06-11 NOTE — Therapy (Signed)
[facility] Physical Therapy [address] [place], [place], [address] Phone: [phone]   Fax:  [phone]  Physical Therapy Treatment  Patient Details  Name: Chelsea Flores MRN: [id number] Date of Birth: Nov 08, 1970 Referring Provider (PT): Jean Rosenthal, MD   Encounter Date: 06/11/2019  PT End of Session - 06/11/19 0850    Visit Number  9    Number of Visits  12    Date for PT Re-Evaluation  06/13/19    Authorization Type  BCBS    PT Start Time  0805    PT Stop Time  0845    PT Time Calculation (min)  40 min    Activity Tolerance  Patient tolerated treatment well    Behavior During Therapy  [facility] for tasks assessed/performed       History reviewed. No pertinent past medical history.  History reviewed. No pertinent surgical history.  There were no vitals filed for this visit.  Subjective Assessment - 06/11/19 0842    Subjective  feels prettty good today no complaints other than minor soreness/achy back, Feels ready to finish up with PT this week and transition to HEP.    Pertinent History  no significant PMH    Limitations  Lifting;Standing    How long can you sit comfortably?  not limited    How long can you stand comfortably?  depends can be an hour can be only 10 min    How long can you walk comfortably?  depends    Diagnostic tests  "MRI is reviewed with her and it does show a small annular disc fissure at L4-L5 with a small protrusion.  There is no evidence of neural compromise.  The remaining aspect of her lumbar spine appears normal."    Patient Stated Goals  reduce overall pain and manage it better    Currently in Pain?  No/denies    Pain Onset  More than a month ago        [facility] Adult PT Treatment/Exercise - 06/11/19 0001      Lumbar Exercises: Stretches   Active Hamstring Stretch  Right;Left;2 reps;30 seconds    Active Hamstring Stretch Limitations  supine with strap    Hip Flexor Stretch  Right;Left;2 reps;30 seconds       Lumbar Exercises: Aerobic   Tread Mill  3.0 mph x 5 min      Lumbar Exercises: Standing   Functional Squats  10 reps;5 seconds   TRX   Row  15 reps    Row Limitations  TRX    Other Standing Lumbar Exercises  TRX SL deadlift x10 bil      Lumbar Exercises: Supine   Bridge  15 reps    Bridge Limitations  on red physioball with hamstring curls    Other Supine Lumbar Exercises  green physioball walk out with 5 sec hold x 10 reps, then H abd and bilat ER with green band X 15 reps ea      Lumbar Exercises: Prone   Other Prone Lumbar Exercises  birddog on pball X 10 reps      Lumbar Exercises: Quadruped   Other Quadruped Lumbar Exercises  birddog progression of touching knee to elbow X 15 reps each side.      Manual Therapy   Manual therapy comments  DN performed by Faustino Congress PT,DPT.    Joint Mobilization  CPA L3-5 5x10 sec grades 203    Soft tissue mobilization  lumbar paraspinals - skilled palpation and monitoring of  soft tissue during DN        PT Short Term Goals - 05/18/19 1102      PT SHORT TERM GOAL #1   Title  Pt will be I and compliant with HEP (4 weeks 05/19/19)    Status  Achieved        PT Long Term Goals - 05/24/19 2157      PT LONG TERM GOAL #1   Title  Pt will reduce overall pain at work and stading at least 2-3 hours to less than 3/10. (Target for all LTG 8 weeks 06/13/19)    Status  On-going      PT LONG TERM GOAL #2   Title  Pt will improve lumbar ROM to WNL.    Status  Achieved      PT LONG TERM GOAL #3   Title  Pt will improve hip strength to 5/5 MMT to improve funciton.    Status  On-going      PT LONG TERM GOAL #4   Title  Pt will be able to hold standard plank 30 seconds and side plank 20 seconds to show improved core strength.    Status  On-going            Plan - 06/11/19 [id number]    Clinical Impression Statement  She had good tolerance to exercise and strength progression without compaints. She feels ready to discharge next visit. PT  will plan to update measurements and goals for discharge to HEP.    Personal Factors and Comorbidities  Time since onset of injury/illness/exacerbation    Examination-Activity Limitations  Bend;Lift;Stand    Examination-Participation Restrictions  --   work or lifting activities   Stability/Clinical Decision Making  Stable/Uncomplicated    Rehab Potential  Good    PT Frequency  2x / week   1-2   PT Duration  8 weeks    PT Treatment/Interventions  ADLs/Self Care Home Management;Aquatic Therapy;Cryotherapy;Electrical Stimulation;Iontophoresis 4mg /ml Dexamethasone;Moist Heat;Traction;Ultrasound;Therapeutic activities;Therapeutic exercise;Balance training;Neuromuscular re-education;Manual techniques;Passive range of motion;Dry needling;Joint Manipulations;Spinal Manipulations;Taping    PT Next Visit Plan  discharge    PT Home Exercise Plan  Access Code: [email or web address]    Consulted and Agree with Plan of Care  Patient       Patient will benefit from skilled therapeutic intervention in order to improve the following deficits and impairments:  Decreased activity tolerance, Decreased mobility, Decreased range of motion, Decreased strength, Hypomobility, Impaired flexibility, Increased muscle spasms, Increased fascial restricitons, Pain  Visit Diagnosis: Chronic bilateral low back pain without sciatica  Muscle weakness (generalized)     Problem List Patient Active Problem List   Diagnosis Date Noted  . Left foot pain 01/09/2016  . Hip pain 04/13/2012    Elsie Ra, PT,DPT 06/11/2019, 10:41 AM  [name], PT, DPT 06/11/19 10:41 AM   [facility] Physical Therapy [address] [place], [place], [address] Phone: [phone]   Fax:  [phone]  Name: PRINCESA HENNESSEE MRN: [id number] Date of Birth: 08/20/1970

## 2019-06-14 ENCOUNTER — Other Ambulatory Visit: Payer: Self-pay

## 2019-06-14 ENCOUNTER — Ambulatory Visit: Payer: BLUE CROSS/BLUE SHIELD | Admitting: Physical Therapy

## 2019-06-14 DIAGNOSIS — G8929 Other chronic pain: Secondary | ICD-10-CM | POA: Diagnosis not present

## 2019-06-14 DIAGNOSIS — M545 Low back pain: Secondary | ICD-10-CM

## 2019-06-14 DIAGNOSIS — M6281 Muscle weakness (generalized): Secondary | ICD-10-CM

## 2019-06-14 NOTE — Therapy (Signed)
[facility] Physical Therapy [address] [place], [place], [address] Phone: [phone]   Fax:  [phone]  Physical Therapy Treatment/Discharge PHYSICAL THERAPY DISCHARGE SUMMARY  Visits from Start of Care: 10  Current functional level related to goals / functional outcomes: See below, back to baseline   Remaining deficits: Mild pain after prolonged standing in one position for hours   Education / Equipment: HEP Plan: Patient agrees to discharge.  Patient goals were met. Patient is being discharged due to being pleased with the current functional level.  ?????   Elsie Ra, PT, DPT 06/14/19 2:45 PM      Patient Details  Name: Chelsea Flores MRN: [id number] Date of Birth: 1971/03/22 Referring Provider (PT): Jean Rosenthal, MD   Encounter Date: 06/14/2019  PT End of Session - 06/14/19 0931    Visit Number  10    Number of Visits  12    Date for PT Re-Evaluation  06/13/19    Authorization Type  BCBS    PT Start Time  0850    PT Stop Time  0928    PT Time Calculation (min)  38 min    Activity Tolerance  Patient tolerated treatment well    Behavior During Therapy  [facility] for tasks assessed/performed       No past medical history on file.  No past surgical history on file.  There were no vitals filed for this visit.  Subjective Assessment - 06/14/19 0930    Subjective  she feels ready for discharge. Wants the DN one more time as it really helps.    Pertinent History  no significant PMH    Limitations  Lifting;Standing    How long can you sit comfortably?  not limited    How long can you stand comfortably?  depends can be an hour can be only 10 min    How long can you walk comfortably?  depends    Diagnostic tests  "MRI is reviewed with her and it does show a small annular disc fissure at L4-L5 with a small protrusion.  There is no evidence of neural compromise.  The remaining aspect of her lumbar spine appears normal."    Patient  Stated Goals  reduce overall pain and manage it better    Currently in Pain?  No/denies    Pain Onset  More than a month ago         [facility] PT Assessment - 06/14/19 0001      Assessment   Medical Diagnosis  Chronic LBP    Referring Provider (PT)  Jean Rosenthal, MD      AROM   Overall AROM Comments  lumbar ROM WNL all planes      Strength   Overall Strength Comments  overall leg strength 5/5 MMT bilat                   [facility] Adult PT Treatment/Exercise - 06/14/19 0001      Lumbar Exercises: Stretches   Other Lumbar Stretch Exercise  child pose 30 sec X 2, sidelying thoracic rotations X 10 ea side holding 5 sec      Lumbar Exercises: Aerobic   Tread Mill  3.0 mph x 5 min      Lumbar Exercises: Seated   Other Seated Lumbar Exercises  seated on Pball for rows, extensions, antirotation/pilof press all with L3 band X 15 reps ea      Lumbar Exercises: Supine   Bridge  15 reps  Bridge Limitations  on red physioball with hamstring curls    Other Supine Lumbar Exercises  green physioball walk out with 5 sec hold x 10 reps, then H abd and bilat ER with green band X 15 reps ea      Lumbar Exercises: Quadruped   Other Quadruped Lumbar Exercises  birddog progression of touching knee to elbow X 15 reps each side.      Manual Therapy   Manual therapy comments  DN performed by Faustino Congress PT,DPT.    Joint Mobilization  CPA L3-5 5x10 sec grades 2-3    Soft tissue mobilization  lumbar paraspinals - skilled palpation and monitoring of soft tissue during DN       Trigger Point Dry Needling - 06/14/19 1445    Consent Given?  Yes    Education Handout Provided  Previously provided    Muscles Treated Back/Hip  Lumbar multifidi    Lumbar multifidi Response  Twitch response elicited             PT Short Term Goals - 05/18/19 1102      PT SHORT TERM GOAL #1   Title  Pt will be I and compliant with HEP (4 weeks 05/19/19)    Status  Achieved        PT  Long Term Goals - 06/14/19 0933      PT LONG TERM GOAL #1   Title  Pt will reduce overall pain at work and stading at least 2-3 hours to less than 3/10. (Target for all LTG 8 weeks 06/13/19)    Status  Partially Met      PT LONG TERM GOAL #2   Title  Pt will improve lumbar ROM to WNL.    Status  Achieved      PT LONG TERM GOAL #3   Title  Pt will improve hip strength to 5/5 MMT to improve funciton.    Status  Achieved      PT LONG TERM GOAL #4   Title  Pt will be able to hold standard plank 30 seconds and side plank 20 seconds to show improved core strength.    Status  Achieved            Plan - 06/14/19 0933    Clinical Impression Statement  She has made excellent progress with PT and feels ready for discahrge. She has met 4/5 PT goals. She has no further questions or concerns and will return to PT if she feels she needs to    Personal Factors and Comorbidities  Time since onset of injury/illness/exacerbation    Examination-Activity Limitations  Bend;Lift;Stand    Examination-Participation Restrictions  --   work or lifting activities   Stability/Clinical Decision Making  Stable/Uncomplicated    Rehab Potential  Good    PT Frequency  2x / week   1-2   PT Duration  8 weeks    PT Treatment/Interventions  ADLs/Self Care Home Management;Aquatic Therapy;Cryotherapy;Electrical Stimulation;Iontophoresis 62m/ml Dexamethasone;Moist Heat;Traction;Ultrasound;Therapeutic activities;Therapeutic exercise;Balance training;Neuromuscular re-education;Manual techniques;Passive range of motion;Dry needling;Joint Manipulations;Spinal Manipulations;Taping    PT Next Visit Plan  discharge    PT Home Exercise Plan  Access Code: [email or web address]    Consulted and Agree with Plan of Care  Patient       Patient will benefit from skilled therapeutic intervention in order to improve the following deficits and impairments:  Decreased activity tolerance, Decreased mobility, Decreased range of motion, Decreased  strength, Hypomobility, Impaired flexibility, Increased muscle spasms, Increased fascial restricitons,  Pain  Visit Diagnosis: Chronic bilateral low back pain without sciatica  Muscle weakness (generalized)     Problem List Patient Active Problem List   Diagnosis Date Noted  . Left foot pain 01/09/2016  . Hip pain 04/13/2012    Elsie Ra, PT, DPT 06/14/19 2:45 PM  [name], PT, DPT 06/14/19 2:45 PM   [facility] Physical Therapy [address] [place], [place], [address] Phone: [phone]   Fax:  [phone]  Name: Chelsea Flores MRN: [id number] Date of Birth: 02-20-1971

## 2019-06-18 DIAGNOSIS — F418 Other specified anxiety disorders: Secondary | ICD-10-CM | POA: Diagnosis not present

## 2019-06-18 DIAGNOSIS — F329 Major depressive disorder, single episode, unspecified: Secondary | ICD-10-CM | POA: Diagnosis not present

## 2019-06-21 DIAGNOSIS — G4700 Insomnia, unspecified: Secondary | ICD-10-CM | POA: Diagnosis not present

## 2019-06-21 DIAGNOSIS — F418 Other specified anxiety disorders: Secondary | ICD-10-CM | POA: Diagnosis not present

## 2019-06-21 DIAGNOSIS — F329 Major depressive disorder, single episode, unspecified: Secondary | ICD-10-CM | POA: Diagnosis not present

## 2019-07-16 DIAGNOSIS — F418 Other specified anxiety disorders: Secondary | ICD-10-CM | POA: Diagnosis not present

## 2019-07-16 DIAGNOSIS — F329 Major depressive disorder, single episode, unspecified: Secondary | ICD-10-CM | POA: Diagnosis not present

## 2019-08-20 DIAGNOSIS — F418 Other specified anxiety disorders: Secondary | ICD-10-CM | POA: Diagnosis not present

## 2019-08-20 DIAGNOSIS — F329 Major depressive disorder, single episode, unspecified: Secondary | ICD-10-CM | POA: Diagnosis not present

## 2019-09-10 DIAGNOSIS — F418 Other specified anxiety disorders: Secondary | ICD-10-CM | POA: Diagnosis not present

## 2019-09-10 DIAGNOSIS — N6310 Unspecified lump in the right breast, unspecified quadrant: Secondary | ICD-10-CM | POA: Diagnosis not present

## 2019-09-10 DIAGNOSIS — F329 Major depressive disorder, single episode, unspecified: Secondary | ICD-10-CM | POA: Diagnosis not present

## 2019-09-11 ENCOUNTER — Other Ambulatory Visit: Payer: Self-pay | Admitting: Obstetrics and Gynecology

## 2019-09-11 DIAGNOSIS — N6310 Unspecified lump in the right breast, unspecified quadrant: Secondary | ICD-10-CM

## 2019-09-11 DIAGNOSIS — N611 Abscess of the breast and nipple: Secondary | ICD-10-CM

## 2019-09-13 ENCOUNTER — Ambulatory Visit
Admission: RE | Admit: 2019-09-13 | Discharge: 2019-09-13 | Disposition: A | Discharge status: Home / Self Care | Payer: BC Managed Care – PPO | Source: Ambulatory Visit | Attending: Obstetrics and Gynecology | Admitting: Obstetrics and Gynecology

## 2019-09-13 ENCOUNTER — Other Ambulatory Visit: Payer: Self-pay

## 2019-09-13 ENCOUNTER — Ambulatory Visit
Admission: RE | Admit: 2019-09-13 | Discharge: 2019-09-13 | Disposition: A | Discharge status: Home / Self Care | Payer: BLUE CROSS/BLUE SHIELD | Source: Ambulatory Visit | Attending: Obstetrics and Gynecology | Admitting: Obstetrics and Gynecology

## 2019-09-13 DIAGNOSIS — N611 Abscess of the breast and nipple: Secondary | ICD-10-CM

## 2019-09-13 DIAGNOSIS — N6310 Unspecified lump in the right breast, unspecified quadrant: Secondary | ICD-10-CM

## 2019-09-13 DIAGNOSIS — R922 Inconclusive mammogram: Secondary | ICD-10-CM | POA: Diagnosis not present

## 2019-09-13 DIAGNOSIS — N6011 Diffuse cystic mastopathy of right breast: Secondary | ICD-10-CM | POA: Diagnosis not present

## 2019-09-27 DIAGNOSIS — Z0000 Encounter for general adult medical examination without abnormal findings: Secondary | ICD-10-CM | POA: Diagnosis not present

## 2019-09-27 DIAGNOSIS — Z79899 Other long term (current) drug therapy: Secondary | ICD-10-CM | POA: Diagnosis not present

## 2019-10-04 DIAGNOSIS — F329 Major depressive disorder, single episode, unspecified: Secondary | ICD-10-CM | POA: Diagnosis not present

## 2019-10-04 DIAGNOSIS — R399 Unspecified symptoms and signs involving the genitourinary system: Secondary | ICD-10-CM | POA: Diagnosis not present

## 2019-10-04 DIAGNOSIS — F418 Other specified anxiety disorders: Secondary | ICD-10-CM | POA: Diagnosis not present

## 2019-10-04 DIAGNOSIS — Z800 Family history of malignant neoplasm of digestive organs: Secondary | ICD-10-CM | POA: Diagnosis not present

## 2019-10-04 DIAGNOSIS — Z0000 Encounter for general adult medical examination without abnormal findings: Secondary | ICD-10-CM | POA: Diagnosis not present

## 2019-10-04 DIAGNOSIS — M545 Low back pain: Secondary | ICD-10-CM | POA: Diagnosis not present

## 2019-10-26 ENCOUNTER — Ambulatory Visit (INDEPENDENT_AMBULATORY_CARE_PROVIDER_SITE_OTHER): Payer: BLUE CROSS/BLUE SHIELD | Admitting: Surgical

## 2019-10-26 DIAGNOSIS — M5442 Lumbago with sciatica, left side: Secondary | ICD-10-CM

## 2019-10-26 DIAGNOSIS — G8929 Other chronic pain: Secondary | ICD-10-CM

## 2019-10-26 DIAGNOSIS — M5441 Lumbago with sciatica, right side: Secondary | ICD-10-CM | POA: Diagnosis not present

## 2019-10-26 MED ORDER — PREDNISONE 10 MG (21) PO TBPK
ORAL_TABLET | ORAL | 0 refills | Status: DC
Start: 2019-10-26 — End: 2020-04-10

## 2019-10-26 MED ORDER — CYCLOBENZAPRINE HCL 10 MG PO TABS
10.0000 mg | ORAL_TABLET | Freq: Three times a day (TID) | ORAL | 0 refills | Status: DC | PRN
Start: 2019-10-26 — End: 2019-10-30

## 2019-10-29 ENCOUNTER — Encounter: Payer: Self-pay | Admitting: Orthopaedic Surgery

## 2019-10-29 ENCOUNTER — Encounter: Payer: Self-pay | Admitting: Physical Therapy

## 2019-10-29 ENCOUNTER — Other Ambulatory Visit: Payer: Self-pay

## 2019-10-29 ENCOUNTER — Ambulatory Visit: Payer: BLUE CROSS/BLUE SHIELD | Admitting: Physical Therapy

## 2019-10-29 DIAGNOSIS — M6281 Muscle weakness (generalized): Secondary | ICD-10-CM | POA: Diagnosis not present

## 2019-10-29 DIAGNOSIS — R2689 Other abnormalities of gait and mobility: Secondary | ICD-10-CM

## 2019-10-29 DIAGNOSIS — M5441 Lumbago with sciatica, right side: Secondary | ICD-10-CM

## 2019-10-29 NOTE — Therapy (Signed)
[facility] [address] [place], [place], [address] Phone: [phone]   Fax:  [phone]  Physical Therapy Evaluation  Patient Details  Name: Chelsea Flores MRN: [id number] Date of Birth: 1970/07/28 Referring Provider (PT): Gloriann Loan, [place]   Encounter Date: 10/29/2019   PT End of Session - 10/29/19 1514    Visit Number 1    Number of Visits 12    Date for PT Re-Evaluation 12/10/19    Authorization Type BCBS    PT Start Time 4431    PT Stop Time 1430    PT Time Calculation (min) 85 min    Activity Tolerance Patient limited by pain    Behavior During Therapy [facility] for tasks assessed/performed;Restless           History reviewed. No pertinent past medical history.  History reviewed. No pertinent surgical history.  There were no vitals filed for this visit.    Subjective Assessment - 10/29/19 1310    Subjective Pain for 25 years, worse with staning in one place or bending over. Works as a Psychologist, clinical. She had PT earlier this year and did well but then 10/26/19 she was at work and was helping lift a dog, and felt a pop in her back and is having some N/T going down her Rt leg. And pain can be between 3-9 out of 10. She feels like she may need stronger pain meds for when she has to work and has bad days of pain.   Pertinent History chronic LBP    Limitations Lifting;Sitting;Standing;Walking;House hold activities    How long can you sit comfortably? depends    How long can you stand comfortably? depends    How long can you walk comfortably? 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    Patient Stated Goals manage the pain better, work as a Psychologist, clinical with less back pain    Currently in Pain? Yes    Pain Score 9     Pain Location Back    Pain Orientation Lower    Pain Descriptors / Indicators Aching;Stabbing;Shooting    Pain Type Acute pain   acute on chronic    Pain Radiating Towards intermittent N/T down Rt leg    Pain Onset In the past 7 days    Pain Frequency Constant    Aggravating Factors  leaning forward, moving at night, getting out of chair    Pain Relieving Factors laying on belly, spinal decompression    Multiple Pain Sites No              [facility] PT Assessment - 10/29/19 0001      Assessment   Medical Diagnosis acute on chronic LBP with Rt sided radiculopathy    Referring Provider (PT) Magnant, Juanda Crumble, PA-C    Onset Date/Surgical Date 10/26/19   date of reinjury to her back   Next MD Visit PRN    Prior Therapy PT earlier this year      Precautions   Precautions None      Restrictions   Weight Bearing Restrictions No      Balance Screen   Has the patient fallen in the past 6 months No    Has the patient had a decrease in activity level because of a fear of falling?  No    Is the patient reluctant to leave their home because of a fear of falling?  No  [place] residence      Prior Function   Level of Independence Independent    Vocation Full time employment    Vocation Requirements Vet    Leisure run, hike      Cognition   Overall Cognitive Status Within Functional Limits for tasks assessed      Sensation   Light Touch Appears Intact      Coordination   Gross Motor Movements are Fluid and Coordinated Yes      Posture/Postural Control   Posture Comments very guarded due to pain      ROM / Strength   AROM / PROM / Strength AROM      AROM   Overall AROM Comments lumbar ROM limited by pain more than hypomobility    AROM Assessment Site Lumbar    Lumbar Flexion 50%    Lumbar Extension 50%    Lumbar - Right Rotation 50%    Lumbar - Left Rotation 50%      Flexibility   Soft Tissue Assessment /Muscle Length --   very tight hamstrings     Palpation   Palpation comment TTP L4-5, QL, and Rt sided lumbar P.S      Special Tests   Other special tests +slump test on Rt  that was neg on Lt, negative quadrant testing bilat, + SLR bilat      Transfers   Comments limited transfers and bed mobility due to pain      Ambulation/Gait   Gait Comments antalgic gait due to pain, does not use AD                      Objective measurements completed on examination: See above findings.       [facility] Adult PT Treatment/Exercise - 10/29/19 0001      Exercises   Exercises Lumbar      Lumbar Exercises: Stretches   Prone on Elbows Stretch Limitations 10 sec X 10 reps throughout session    Other Lumbar Stretch Exercise prone lying 15 min with ice      Modalities   Modalities Traction;Cryotherapy      Cryotherapy   Number Minutes Cryotherapy 15 Minutes    Cryotherapy Location Lumbar Spine    Type of Cryotherapy Ice pack      Traction   Type of Traction Lumbar    Min (lbs) 55    Max (lbs) 70    Hold Time 60    Rest Time 20    Time 15      Manual Therapy   Manual therapy comments long axis distraction bilat, STM/IASTM to Rt sided QL, lumbar P.S. Long axis distraction bilat, central PA mobs to L4-5 grade 1-2                  PT Education - 10/29/19 1514    Education Details HEP, PT POC, exam findings    Person(s) Educated Patient    Methods Explanation;Demonstration;Verbal cues;Handout    Comprehension Verbalized understanding               PT Long Term Goals - 10/29/19 1547      PT LONG TERM GOAL #1   Title Pt will reduce overall pain at work and stading at least 2-3 hours to less than 3/10. (Target for all LTG 6 weeks 12/10/19)    Time 6    Period Weeks    Status New    Target Date  12/10/19      PT LONG TERM GOAL #2   Title Pt will no longer report radiculopathy into her Rt leg    Status New      PT LONG TERM GOAL #3   Title Pt will improve lumbar AROM to WNL without pain.    Status New      PT LONG TERM GOAL #4   Title -                  Plan - 10/29/19 1516    Clinical Impression Statement Pt  presents with acute on chronic LBP with Rt sided radiculopathy that presents more disc patholgy. She has overall decreased lumbar ROM and has very guarded postrue due to pain limiting her functional abilities, transfers, and work Teacher, early years/pre. Pain relieved some with mckenzie extension based program begining with prone lying. Attemped mechanical traction today however after traction was over she twisted when she sat up and was in severe pain and not able to stand. She then had to lay back down prone with ice until her pain and spasms calmed down enough to allow her to ambulate out of clinic on her own. She may benefit from skilled PT to address her funcitonal deficits, but may also benefit from imaging (if she does not improve) or better pain medication to allow for improvements in activity tolerance with PT.    Personal Factors and Comorbidities Comorbidity 1    Comorbidities chornic LBP    Examination-Activity Limitations Lift;Stand;Squat;Sleep;Bend;Locomotion Level;Transfers    Examination-Participation Restrictions Cleaning;Community Activity;Driving   work   Stability/Clinical Decision Making Evolving/Moderate complexity    Clinical Decision Making Moderate    Rehab Potential Good    PT Frequency 2x / week   1-2   PT Duration 6 weeks    PT Treatment/Interventions ADLs/Self Care Home Management;Cryotherapy;Electrical Stimulation;Iontophoresis 4mg /ml Dexamethasone;Moist Heat;Traction;Ultrasound;Gait training;Therapeutic activities;Therapeutic exercise;Neuromuscular re-education;Patient/family education;Manual techniques;Passive range of motion;Dry needling;Joint Manipulations;Taping;Spinal Manipulations    PT Next Visit Plan how was HEP, appeared to favor extension, had more pain upon sitting up after tracton last time, consider DN or other manual therapy    PT Home Exercise Plan [email or web address]    Consulted and Agree with Plan of Care Patient           Patient will benefit from skilled therapeutic  intervention in order to improve the following deficits and impairments:  Decreased activity tolerance, Decreased range of motion, Decreased strength, Difficulty walking, Impaired flexibility, Increased muscle spasms, Increased fascial restricitons, Pain, Improper body mechanics  Visit Diagnosis: Bilateral low back pain with right-sided sciatica, unspecified chronicity  Muscle weakness (generalized)  Other abnormalities of gait and mobility     Problem List Patient Active Problem List   Diagnosis Date Noted  . Left foot pain 01/09/2016  . Hip pain 04/13/2012    Debbe Odea, PT,DPT 10/29/2019, 3:54 PM  [facility] [address] [place], [place], [address] Phone: [phone]   Fax:  [phone]  Name: Chelsea Flores MRN: [id number] Date of Birth: Jan 22, 1971

## 2019-10-29 NOTE — Patient Instructions (Signed)
Access Code: [email or web address]: [email or web address]: 06/21/2021Prepared by: Aaron Edelman NelsonExercises  Prone on Elbows Stretch - 2 x daily - 6 x weekly - 3 sets - 3 reps - 20-30 hold  Cat-Camel - 2 x daily - 6 x weekly - 10 reps - 1 sets - 5 hold  Supine Piriformis Stretch - 2 x daily - 6 x weekly - 1 sets - 2 reps - 30 hold  Supine Hamstring Stretch with Strap - 2 x daily - 6 x weekly - 3 reps - 1 sets - 30 hold  Supine Bridge - 2 x daily - 6 x weekly - 10 reps - 1-2 sets - 5 hold  Standing Lumbar Extension - 2 x daily - 6 x weekly - 10 reps - 1-2 sets - 5 hold  Standing Quadratus Lumborum Stretch with Doorway - 2 x daily - 6 x weekly - 3 reps - 1 sets - 30 hold  Slump Stretch - 2 x daily - 6 x weekly - 10 reps - 1-2 sets - 3 hold  Hooklying Lumbar Traction - 2 x daily - 6 x weekly - 10 reps - 3 sets

## 2019-10-30 ENCOUNTER — Other Ambulatory Visit: Payer: Self-pay | Admitting: Orthopaedic Surgery

## 2019-10-30 MED ORDER — CYCLOBENZAPRINE HCL 10 MG PO TABS: 10.0000 mg | ORAL_TABLET | Freq: Three times a day (TID) | ORAL | 0 refills | Status: DC | PRN

## 2019-10-30 MED ORDER — ACETAMINOPHEN-CODEINE #3 300-30 MG PO TABS: 1.0000 | ORAL_TABLET | Freq: Three times a day (TID) | ORAL | 0 refills | Status: DC | PRN

## 2019-11-13 ENCOUNTER — Encounter: Payer: Self-pay | Admitting: Surgical

## 2019-11-13 NOTE — Progress Notes (Signed)
Office Visit Note   Patient: Chelsea Flores           Date of Birth: 1970/09/21           MRN: [id number] Visit Date: 10/26/2019 Requested by: Shon Baton, [place] [place],  [place] [address] PCP: Shon Baton, MD  Subjective: Chief Complaint  Patient presents with  . Lower Back - Pain    HPI: Chelsea Flores is a 49 y.o. female who presents to the office complaining of low back pain.  Patient was lifting a dog at work when she felt intense pain in her low back.  She has a history of multiple low back injuries.  She states that her pain is worse in her axial lumbar spine.  She does have some radicular leg pain but overall her low back is the main area of concern.  She denies any subjective weakness.  She states her pain is "about as bad as it ever been".  She has taken 2 muscle laxatives with tramadol with no significant relief.  She had an MRI of her lumbar spine most recently in 2020 that showed no significant findings.  She has seen Dr. Ninfa Linden in the past.  She had a steroid course last in August 2020 and her last visits to physical therapy were in February of this year.  She has not kept up with her home exercise program from PT.  Denies any red flag symptoms such as bowel/bladder incontinence or saddle anesthesia.                ROS:  All systems reviewed are negative as they relate to the chief complaint within the history of present illness.  Patient denies fevers or chills.  Assessment & Plan: Visit Diagnoses:  1. 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  She was lifting a dog earlier today when she strained her low back.  This is happened multiple times in the past and has been a chronic recurring issue for her.  She had an MRI of her lumbar spine in 2020 that showed no significant findings for which she has seen Dr. Ninfa Linden in the past.  She has no subjective weakness or any frank  weakness on exam today.  She does have some radicular leg pain that bothers her in both of her legs.  Her back is her main concern rather than leg pain however.  Discussed options available to patient.  We will get her back in physical therapy as she has been seen by the physical therapist in this facility before.  Also prescribed steroid Dosepak as well as Flexeril.  Patient agreed with this plan.  Follow-up with Dr. Ninfa Linden after several weeks of PT.  Follow-Up Instructions: No follow-ups on file.   Orders:  Orders Placed This Encounter  Procedures  . Ambulatory referral to Physical Therapy   Meds ordered this encounter  Medications  . predniSONE (STERAPRED UNI-PAK 21 TAB) 10 MG (21) TBPK tablet    Sig: Take as directed on packaging    Dispense:  21 tablet    Refill:  0  . DISCONTD: cyclobenzaprine (FLEXERIL) 10 MG tablet    Sig: Take 1 tablet (10 mg total) by mouth 3 (three) times daily as needed for muscle spasms.    Dispense:  15 tablet    Refill:  0      Procedures: No procedures performed  Clinical Data: No additional findings.  Objective: Vital Signs: There were no vitals taken for this visit.  Physical Exam:  Constitutional: Patient appears well-developed HEENT:  Head: Normocephalic Eyes:EOM are normal Neck: Normal range of motion Cardiovascular: Normal rate Pulmonary/chest: Effort normal Neurologic: Patient is alert Skin: Skin is warm Psychiatric: Patient has normal mood and affect  Ortho Exam:  Significant tenderness to palpation throughout the axial lumbar spine and paraspinal musculature.  Positive straight leg raise bilaterally.  5/5 motor strength of the bilateral hip flexors, quadriceps, hamstring, dorsiflexion, plantarflexion.  Sensation intact in all dermatomes of the bilateral lower extremities.  No pain elicited with internal rotation/external rotation of the bilateral hip joints.  Specialty Comments:  No specialty comments  available.  Imaging: No results found.   PMFS History: Patient Active Problem List   Diagnosis Date Noted  . Left foot pain 01/09/2016  . Hip pain 04/13/2012   No past medical history on file.  No family history on file.  No past surgical history on file. Social History   Occupational History  . Not on file  Tobacco Use  . Smoking status: Never Smoker  . Smokeless tobacco: Never Used  Substance and Sexual Activity  . Alcohol use: Not on file  . Drug use: Not on file  . Sexual activity: Not on file

## 2019-11-16 ENCOUNTER — Ambulatory Visit: Payer: BLUE CROSS/BLUE SHIELD | Admitting: Physical Therapy

## 2019-11-16 ENCOUNTER — Other Ambulatory Visit: Payer: Self-pay

## 2019-11-16 DIAGNOSIS — M6281 Muscle weakness (generalized): Secondary | ICD-10-CM | POA: Diagnosis not present

## 2019-11-16 DIAGNOSIS — M5441 Lumbago with sciatica, right side: Secondary | ICD-10-CM

## 2019-11-16 DIAGNOSIS — R2689 Other abnormalities of gait and mobility: Secondary | ICD-10-CM | POA: Diagnosis not present

## 2019-11-16 NOTE — Therapy (Signed)
[facility] [address] [place], [place], [address] Phone: [phone]   Fax:  [phone]  Physical Therapy Treatment  Patient Details  Name: Chelsea Flores MRN: [id number] Date of Birth: 04-11-1971 Referring Provider (PT): Chelsea Flores, [place]   Encounter Date: 11/16/2019   PT End of Session - 11/16/19 1329    Visit Number 2    Number of Visits 12    Date for PT Re-Evaluation 12/10/19    Authorization Type BCBS    PT Start Time 1100    PT Stop Time 1145    PT Time Calculation (min) 45 min    Activity Tolerance Patient tolerated treatment well    Behavior During Therapy Chelsea Flores for tasks assessed/performed           No past medical history on file.  No past surgical history on file.  There were no vitals filed for this visit.   Subjective Assessment - 11/16/19 1315    Subjective relays pain is much better in her back about 1-2 out of 10 today. She wants to know what to do if the pain comes back and wants to know more core exercises she can do to help prevent it.    Pertinent History chronic LBP    Limitations Lifting;Sitting;Standing;Walking;House hold activities    How long can you sit comfortably? depends    How long can you stand comfortably? depends    How long can you walk comfortably? 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 Diagonal lifts 10 lbs X 15 bilat    Other Standing Lumbar Exercises reviewed lifting mechanics      Lumbar Exercises: Supine   AB Set Limitations  isometric 90/90 hip flexion with knee flexion, contralateral heel slide 4-6 inches above surface X 10 reps bilat. V hollow body holds with shoulder flexion/extension 100's. Side planks and front planks form knees.                       PT Long Term Goals - 10/29/19 1547      PT LONG TERM GOAL #1   Title Pt will reduce overall pain at work and stading at least 2-3 hours to less than 3/10. (Target for all LTG 6 weeks 12/10/19)    Time 6    Period Weeks    Status New    Target Date 12/10/19      PT LONG TERM GOAL #2   Title Pt will no longer report radiculopathy into her Rt leg    Status New      PT LONG TERM GOAL #3   Title Pt will improve lumbar AROM to WNL without pain.    Status New      PT LONG TERM GOAL #4   Title -  Plan - 11/16/19 1344    Clinical Impression Statement She was not having much back pain and was not having any radiculopathy today. Session focused on self care and education for Chelsea Flores program if her back pain returns and progression of exercises. She was also educated on proper core exercises and lifting technigues as well. She showed good understanding of this.    Personal Factors and Comorbidities Comorbidity 1    Comorbidities chornic LBP    Examination-Activity Limitations Lift;Stand;Squat;Sleep;Bend;Locomotion Level;Transfers    Examination-Participation Restrictions Cleaning;Community Activity;Driving   work   Stability/Clinical Decision Making Evolving/Moderate complexity    Rehab Potential Good    PT Frequency 2x / week   1-2   PT Duration 6 weeks    PT Treatment/Interventions ADLs/Self Care Home Management;Cryotherapy;Electrical Stimulation;Iontophoresis 4mg /ml Dexamethasone;Moist Heat;Traction;Ultrasound;Gait training;Therapeutic activities;Therapeutic exercise;Neuromuscular re-education;Patient/family education;Manual techniques;Passive range of motion;Dry needling;Joint Manipulations;Taping;Spinal Manipulations     PT Next Visit Plan how was HEP, appeared to favor extension, avoid traction machine as it made her pain worse,  consider DN or other manual therapy    PT Home Exercise Plan [email or web address], adddd dead bug, anti rotation press, diagonal lifts, bird dogs in prone, isometric hip flexion with contralateral heelsided above surface    Consulted and Agree with Plan of Care Patient           Patient will benefit from skilled therapeutic intervention in order to improve the following deficits and impairments:  Decreased activity tolerance, Decreased range of motion, Decreased strength, Difficulty walking, Impaired flexibility, Increased muscle spasms, Increased fascial restricitons, Pain, Improper body mechanics  Visit Diagnosis: Bilateral low back pain with right-sided sciatica, unspecified chronicity  Muscle weakness (generalized)  Other abnormalities of gait and mobility     Problem List Patient Active Problem List   Diagnosis Date Noted  . Left foot pain 01/09/2016  . Hip pain 04/13/2012    Chelsea Flores 11/16/2019, 1:55 PM  [facility] [address] [place], [place], [address] Phone: [phone]   Fax:  [phone]  Name: Chelsea Flores MRN: [id number] Date of Birth: 1971/01/01

## 2019-11-19 ENCOUNTER — Other Ambulatory Visit: Payer: Self-pay

## 2019-11-19 ENCOUNTER — Encounter: Payer: Self-pay | Admitting: Physical Therapy

## 2019-11-19 ENCOUNTER — Ambulatory Visit: Payer: BLUE CROSS/BLUE SHIELD | Admitting: Physical Therapy

## 2019-11-19 DIAGNOSIS — M6281 Muscle weakness (generalized): Secondary | ICD-10-CM | POA: Diagnosis not present

## 2019-11-19 DIAGNOSIS — R2689 Other abnormalities of gait and mobility: Secondary | ICD-10-CM | POA: Diagnosis not present

## 2019-11-19 DIAGNOSIS — M5441 Lumbago with sciatica, right side: Secondary | ICD-10-CM

## 2019-11-19 DIAGNOSIS — M545 Low back pain, unspecified: Secondary | ICD-10-CM

## 2019-11-19 DIAGNOSIS — G8929 Other chronic pain: Secondary | ICD-10-CM

## 2019-11-19 NOTE — Therapy (Addendum)
[facility] Physical Therapy [address] [place], [place], [address] Phone: [phone]   Fax:  [phone]  Physical Therapy Treatment/Discharge addendum PHYSICAL THERAPY DISCHARGE SUMMARY  Visits from Start of Care: 3  Current functional level related to goals / functional outcomes: See below   Remaining deficits: See below   Education / Equipment: HEP  Plan: Patient agrees to discharge.  Patient goals were met. Patient is being discharged due to not returning since the last visit.  ?????   Elsie Ra, PT, DPT 02/25/20 9:06 AM     Patient Details  Name: Christella App MRN: [id number] Date of Birth: 12-Aug-1970 Referring Provider (PT): Gloriann Loan, [place]   Encounter Date: 11/19/2019   PT End of Session - 11/19/19 0949    Visit Number 3    Number of Visits 12    Date for PT Re-Evaluation 12/10/19    Authorization Type BCBS    PT Start Time 0844    PT Stop Time 0925    PT Time Calculation (min) 41 min    Activity Tolerance Patient tolerated treatment well    Behavior During Therapy [facility] for tasks assessed/performed           History reviewed. No pertinent past medical history.  History reviewed. No pertinent surgical history.  There were no vitals filed for this visit.   Subjective Assessment - 11/19/19 0942    Subjective relays no pain upon arrival, was able to jog .5 miles yesteday without complaints. She is moving in one week so this may be her last appointment but does have one visit left she may keep after she sees MD or she may call to cancel if she is still feeling good.    Pertinent History chronic LBP    Limitations Lifting;Sitting;Standing;Walking;House hold activities    How long can you sit comfortably? depends    How long can you stand comfortably? depends    How long can you walk comfortably? depends    Diagnostic tests nothing recent but older MRI showed small annular disc fissure at L4-L5 with a small  protrusion.    Patient Stated Goals manage the pain better, work as a Psychologist, clinical with less back pain    Pain Onset In the past 7 days                             [facility] Adult PT Treatment/Exercise - 11/19/19 0001      Lumbar Exercises: Stretches   Lower Trunk Rotation Limitations 10 reps bilat holding 5 sec    Quadruped Mid Back Stretch 2 reps;30 seconds    Other Lumbar Stretch Exercise prone lying over one pillow progressed to POE 60 sec x 2 reps then progressed to prone press ups 10 reps      Lumbar Exercises: Standing   Other Standing Lumbar Exercises Diagnoal lifts 10 lbs X 15 bilat, Bent over row 10 lbs X 20 bilat    Other Standing Lumbar Exercises SL DL with 5 lbs X 15 reps bilat, DL Deadliift 15 lbs X 15 reps with lifting mechanics reviewed      Lumbar Exercises: Supine   AB Set Limitations isometric 90/90 hip flexion with knee flexion, contralateral heel slide 4-6 inches above surface X 10 reps bilat. V hollow body holds with shoulder flexion/extension 100's.     Bridge with [employer] Limitations 2 sets of 10      Lumbar Exercises: Quadruped   [place]cat/Old  Horse 10 reps    Opposite Arm/Leg Raise Limitations with contact elbow to knee, 2X10 reps                       PT Long Term Goals - 11/19/19 1017      PT LONG TERM GOAL #1   Title Pt will reduce overall pain at work and stading at least 2-3 hours to less than 3/10. (Target for all LTG 6 weeks 12/10/19)    Time 6    Period Weeks    Status On-going      PT LONG TERM GOAL #2   Title Pt will no longer report radiculopathy into her Rt leg    Status Achieved      PT LONG TERM GOAL #3   Title Pt will improve lumbar AROM to WNL without pain.    Status Achieved                 Plan - 11/19/19 0950    Clinical Impression Statement She is not having much pain and appears to be back to baseline without limitations. Reviewed her mckenzie extension based program and core strength program for  management of low back pain. She shows good understanding of this and good technique. She will F/U with MD next and may finish up with PT if no other concerns.    Personal Factors and Comorbidities Comorbidity 1    Comorbidities chornic LBP    Examination-Activity Limitations Lift;Stand;Squat;Sleep;Bend;Locomotion Level;Transfers    Examination-Participation Restrictions Cleaning;Community Activity;Driving   work   Stability/Clinical Decision Making Evolving/Moderate complexity    Rehab Potential Good    PT Frequency 2x / week   1-2   PT Duration 6 weeks    PT Treatment/Interventions ADLs/Self Care Home Management;Cryotherapy;Electrical Stimulation;Iontophoresis 44m/ml Dexamethasone;Moist Heat;Traction;Ultrasound;Gait training;Therapeutic activities;Therapeutic exercise;Neuromuscular re-education;Patient/family education;Manual techniques;Passive range of motion;Dry needling;Joint Manipulations;Taping;Spinal Manipulations    PT Next Visit Plan DC?    PT Home Exercise Plan [id number] adddd dead bug, anti rotation press, diagonal lifts, bird dogs in prone, isometric hip flexion with contralateral heelsided above surface    Consulted and Agree with Plan of Care Patient           Patient will benefit from skilled therapeutic intervention in order to improve the following deficits and impairments:  Decreased activity tolerance, Decreased range of motion, Decreased strength, Difficulty walking, Impaired flexibility, Increased muscle spasms, Increased fascial restricitons, Pain, Improper body mechanics  Visit Diagnosis: Bilateral low back pain with right-sided sciatica, unspecified chronicity  Muscle weakness (generalized)  Other abnormalities of gait and mobility  Chronic bilateral low back pain without sciatica     Problem List Patient Active Problem List   Diagnosis Date Noted  . Left foot pain 01/09/2016  . Hip pain 04/13/2012    BSilvestre Mesi7/04/2020, 10:18  AM  [facility] [address] [address]Phone: [phone]  Fax:  [phone] Name: ADalisa ForrerMRN: [id number] of Birth: [birth year]

## 2019-11-22 ENCOUNTER — Ambulatory Visit: Payer: BLUE CROSS/BLUE SHIELD | Admitting: Orthopaedic Surgery

## 2019-11-22 ENCOUNTER — Other Ambulatory Visit: Payer: Self-pay

## 2019-11-22 ENCOUNTER — Encounter: Payer: Self-pay | Admitting: Orthopaedic Surgery

## 2019-11-22 ENCOUNTER — Encounter: Payer: BLUE CROSS/BLUE SHIELD | Admitting: Physical Therapy

## 2019-11-22 DIAGNOSIS — M5441 Lumbago with sciatica, right side: Secondary | ICD-10-CM

## 2019-11-22 DIAGNOSIS — M5442 Lumbago with sciatica, left side: Secondary | ICD-10-CM | POA: Diagnosis not present

## 2019-11-22 DIAGNOSIS — G8929 Other chronic pain: Secondary | ICD-10-CM | POA: Diagnosis not present

## 2019-11-22 NOTE — Progress Notes (Signed)
   Office Visit Note   Patient: Chelsea Flores           Date of Birth: March 25, 1971           MRN: [id number] Visit Date: 11/22/2019              Requested by: Shon Baton, [place],  [place] [address] PCP: Shon Baton, MD   Assessment & Plan: Visit Diagnoses:  1. Chronic bilateral low back pain with bilateral sciatica     Plan: Since her radicular symptoms have calmed down significantly, we will just watch this conservatively.  However if she does develop worsening pain with radicular symptoms this would certainly warrant a MRI of her lumbar spine.  She will continue work on core strengthening exercises.  All questions and concerns were answered and addressed.  Follow-up will be as needed.  Follow-Up Instructions: Return if symptoms worsen or fail to improve.   Orders:  No orders of the defined types were placed in this encounter.  No orders of the defined types were placed in this encounter.     Procedures: No procedures performed   Clinical Data: No additional findings.   Subjective: Chief Complaint  Patient presents with  . Lower Back - Follow-up  [name] is well-known to me.  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  We have been treating chronic back pain with mainly just some degenerative disc disease at L4-L5 and L5-S1 with some slight fissuring but no central or foraminal stenosis.  2 weeks ago she was in physical therapy and had a searing sharp pain that occurred in her right lower back and it did cause radicular symptoms with numbness and tingling going down her leg and the pain was quite severe.  I sent in some pain medications and steroid.  Since then she is doing much better overall.  She is denying any radicular symptoms and is much more comfortable.  HPI  Review of Systems She currently denies any acute change in her medical status  Objective: Vital Signs: There were no vitals taken for this  visit.  Physical Exam She is alert and orient x3 and in no acute distress Ortho Exam Examination of her bilateral extremities shows normal muscle tone and strength and normal sensation in all dermatomes.  She has negative straight leg raise bilaterally.  She is much more flexible with her lumbar spine. Specialty Comments:  No specialty comments available.  Imaging: No results found. We did review her plain films from last year of her lumbar spine as well as the MRI from November of this past year.  PMFS History: Patient Active Problem List   Diagnosis Date Noted  . Left foot pain 01/09/2016  . Hip pain 04/13/2012   History reviewed. No pertinent past medical history.  History reviewed. No pertinent family history.  History reviewed. No pertinent surgical history. Social History   Occupational History  . Not on file  Tobacco Use  . Smoking status: Never Smoker  . Smokeless tobacco: Never Used  Substance and Sexual Activity  . Alcohol use: Not on file  . Drug use: Not on file  . Sexual activity: Not on file

## 2020-01-31 ENCOUNTER — Other Ambulatory Visit (HOSPITAL_COMMUNITY): Payer: Self-pay | Admitting: Gastroenterology

## 2020-01-31 ENCOUNTER — Other Ambulatory Visit: Payer: Self-pay | Admitting: Gastroenterology

## 2020-01-31 DIAGNOSIS — R1011 Right upper quadrant pain: Secondary | ICD-10-CM

## 2020-01-31 DIAGNOSIS — Z86010 Personal history of colonic polyps: Secondary | ICD-10-CM | POA: Diagnosis not present

## 2020-01-31 DIAGNOSIS — K219 Gastro-esophageal reflux disease without esophagitis: Secondary | ICD-10-CM | POA: Diagnosis not present

## 2020-01-31 DIAGNOSIS — R1012 Left upper quadrant pain: Secondary | ICD-10-CM | POA: Diagnosis not present

## 2020-02-11 ENCOUNTER — Other Ambulatory Visit: Payer: Self-pay

## 2020-02-11 ENCOUNTER — Encounter (HOSPITAL_COMMUNITY): Admission: RE | Admit: 2020-02-11 | Payer: BLUE CROSS/BLUE SHIELD | Source: Ambulatory Visit

## 2020-02-11 ENCOUNTER — Ambulatory Visit (HOSPITAL_COMMUNITY)
Admission: RE | Admit: 2020-02-11 | Discharge: 2020-02-11 | Disposition: A | Discharge status: Home / Self Care | Payer: BLUE CROSS/BLUE SHIELD | Source: Ambulatory Visit | Attending: Gastroenterology | Admitting: Gastroenterology

## 2020-02-11 DIAGNOSIS — K8020 Calculus of gallbladder without cholecystitis without obstruction: Secondary | ICD-10-CM | POA: Diagnosis not present

## 2020-02-11 DIAGNOSIS — R1011 Right upper quadrant pain: Secondary | ICD-10-CM | POA: Diagnosis not present

## 2020-03-20 DIAGNOSIS — Z8249 Family history of ischemic heart disease and other diseases of the circulatory system: Secondary | ICD-10-CM | POA: Diagnosis not present

## 2020-03-20 DIAGNOSIS — K8020 Calculus of gallbladder without cholecystitis without obstruction: Secondary | ICD-10-CM | POA: Diagnosis not present

## 2020-03-25 ENCOUNTER — Other Ambulatory Visit (HOSPITAL_COMMUNITY): Payer: Self-pay | Admitting: Gastroenterology

## 2020-03-25 DIAGNOSIS — Z1231 Encounter for screening mammogram for malignant neoplasm of breast: Secondary | ICD-10-CM

## 2020-03-26 ENCOUNTER — Ambulatory Visit (HOSPITAL_COMMUNITY)
Admission: RE | Admit: 2020-03-26 | Discharge: 2020-03-26 | Disposition: A | Discharge status: Home / Self Care | Payer: BLUE CROSS/BLUE SHIELD | Source: Ambulatory Visit | Attending: Gastroenterology | Admitting: Gastroenterology

## 2020-03-26 ENCOUNTER — Other Ambulatory Visit: Payer: Self-pay

## 2020-03-26 DIAGNOSIS — Z1231 Encounter for screening mammogram for malignant neoplasm of breast: Secondary | ICD-10-CM | POA: Diagnosis not present

## 2020-03-27 ENCOUNTER — Encounter: Payer: Self-pay | Admitting: Orthopaedic Surgery

## 2020-03-28 ENCOUNTER — Other Ambulatory Visit: Payer: Self-pay | Admitting: Orthopaedic Surgery

## 2020-03-28 MED ORDER — METHOCARBAMOL 500 MG PO TABS: 500.0000 mg | ORAL_TABLET | Freq: Four times a day (QID) | ORAL | 1 refills | Status: DC | PRN

## 2020-03-31 ENCOUNTER — Other Ambulatory Visit: Payer: Self-pay

## 2020-03-31 ENCOUNTER — Other Ambulatory Visit: Payer: Self-pay | Admitting: Orthopaedic Surgery

## 2020-03-31 DIAGNOSIS — G8929 Other chronic pain: Secondary | ICD-10-CM

## 2020-03-31 MED ORDER — DIAZEPAM 5 MG PO TABS: 5.0000 mg | ORAL_TABLET | Freq: Once | ORAL | 0 refills | Status: AC

## 2020-04-01 ENCOUNTER — Encounter (HOSPITAL_COMMUNITY)
Admission: RE | Admit: 2020-04-01 | Discharge: 2020-04-01 | Disposition: A | Discharge status: Home / Self Care | Payer: BLUE CROSS/BLUE SHIELD | Source: Ambulatory Visit | Attending: General Surgery | Admitting: General Surgery

## 2020-04-01 ENCOUNTER — Other Ambulatory Visit: Payer: Self-pay

## 2020-04-01 ENCOUNTER — Encounter (HOSPITAL_COMMUNITY): Payer: Self-pay

## 2020-04-01 DIAGNOSIS — Z01812 Encounter for preprocedural laboratory examination: Secondary | ICD-10-CM | POA: Diagnosis not present

## 2020-04-01 HISTORY — DX: Other specified postprocedural states: Z98.890

## 2020-04-01 HISTORY — DX: Unspecified asthma, uncomplicated: J45.909

## 2020-04-01 HISTORY — DX: Anxiety disorder, unspecified: F41.9

## 2020-04-01 HISTORY — DX: Other complications of anesthesia, initial encounter: T88.59XA

## 2020-04-01 HISTORY — DX: Cardiac arrhythmia, unspecified: I49.9

## 2020-04-01 HISTORY — DX: Depression, unspecified: F32.A

## 2020-04-01 HISTORY — DX: Nausea with vomiting, unspecified: R11.2

## 2020-04-01 HISTORY — DX: Dyspnea, unspecified: R06.00

## 2020-04-01 LAB — CBC
HCT: 41.4 % (ref 36.0–46.0)
Hemoglobin: 14 g/dL (ref 12.0–15.0)
MCH: 31.3 pg (ref 26.0–34.0)
MCHC: 33.8 g/dL (ref 30.0–36.0)
MCV: 92.4 fL (ref 80.0–100.0)
Platelets: 222 10*3/uL (ref 150–400)
RBC: 4.48 MIL/uL (ref 3.87–5.11)
RDW: 11.5 % (ref 11.5–15.5)
WBC: 4.4 10*3/uL (ref 4.0–10.5)
nRBC: 0 % (ref 0.0–0.2)

## 2020-04-01 NOTE — Patient Instructions (Addendum)
DUE TO COVID-19 ONLY ONE VISITOR IS ALLOWED TO COME WITH YOU AND STAY IN THE WAITING ROOM ONLY DURING PRE OP AND PROCEDURE DAY OF SURGERY. THE 1 VISITOR  MAY VISIT WITH YOU AFTER SURGERY IN YOUR PRIVATE ROOM DURING VISITING HOURS ONLY!  YOU NEED TO HAVE A COVID 19 TEST ON: 04/07/20 @ 2:00 PM , THIS TEST MUST BE DONE BEFORE SURGERY,  COVID TESTING SITE [place] [id number], IT IS ON THE RIGHT GOING OUT [address] APPROXIMATELY  2 MINUTES PAST [employer] ON THE RIGHT. ONCE YOUR COVID TEST IS COMPLETED,  PLEASE BEGIN THE QUARANTINE INSTRUCTIONS AS OUTLINED IN YOUR HANDOUT.                [place]    Your procedure is scheduled on: 04/10/20   Report to [facility] Main  Entrance   Report to admitting at: 11:30 AM     Call this number if you have problems the morning of surgery [phone]    Remember: Do not eat solid food :After Midnight. Clear liquids until: 10:30 am.   CLEAR LIQUID DIET   Foods Allowed                                                                     Foods Excluded  Coffee and tea, regular and decaf                             liquids that you cannot  Plain Jell-O any favor except red or purple                                           see through such as: Fruit ices (not with fruit pulp)                                     milk, soups, orange juice  Iced Popsicles                                    All solid food Carbonated beverages, regular and diet                                    Cranberry, grape and apple juices Sports drinks like Gatorade Lightly seasoned clear broth or consume(fat free) Sugar, honey syrup  Sample Menu Breakfast                                Lunch                                     Supper Cranberry juice  Beef broth                            Chicken broth Jell-O                                     Grape juice                           Apple juice Coffee or  tea                        Jell-O                                      Popsicle                                                Coffee or tea                        Coffee or tea  _____________________________________________________________________  BRUSH YOUR TEETH MORNING OF SURGERY AND RINSE YOUR MOUTH OUT, NO CHEWING GUM CANDY OR MINTS.                       You may not have any metal on your body including hair pins and              piercings  Do not wear jewelry, make-up, lotions, powders or perfumes, deodorant             Do not wear nail polish on your fingernails.  Do not shave  48 hours prior to surgery.    Do not bring valuables to the hospital. [place].  Contacts, dentures or bridgework may not be worn into surgery.  Leave suitcase in the car. After surgery it may be brought to your room.     Patients discharged the day of surgery will not be allowed to drive home. IF YOU ARE HAVING SURGERY AND GOING HOME THE SAME DAY, YOU MUST HAVE AN ADULT TO DRIVE YOU HOME AND BE WITH YOU FOR 24 HOURS. YOU MAY GO HOME BY TAXI OR UBER OR ORTHERWISE, BUT AN ADULT MUST ACCOMPANY YOU HOME AND STAY WITH YOU FOR 24 HOURS.  Name and phone number of your driver:  Special Instructions: N/A              Please read over the following fact sheets you were given: ____________________________________________________________________          [facility] - Preparing for Surgery Before surgery, you can play an important role.  Because skin is not sterile, your skin needs to be as free of germs as possible.  You can reduce the number of germs on your skin by washing with CHG (chlorahexidine gluconate) soap before surgery.  CHG is an antiseptic cleaner which kills germs and bonds with the skin to continue killing germs even after washing. Please DO NOT use if you have an  allergy to CHG or antibacterial soaps.  If your skin becomes reddened/irritated stop  using the CHG and inform your nurse when you arrive at Short Stay. Do not shave (including legs and underarms) for at least 48 hours prior to the first CHG shower.  You may shave your face/neck. Please follow these instructions carefully:  1.  Shower with CHG Soap the night before surgery and the  morning of Surgery.  2.  If you choose to wash your hair, wash your hair first as usual with your  normal  shampoo.  3.  After you shampoo, rinse your hair and body thoroughly to remove the  shampoo.                           4.  Use CHG as you would any other liquid soap.  You can apply chg directly  to the skin and wash                       Gently with a scrungie or clean washcloth.  5.  Apply the CHG Soap to your body ONLY FROM THE NECK DOWN.   Do not use on face/ open                           Wound or open sores. Avoid contact with eyes, ears mouth and genitals (private parts).                       Wash face,  Genitals (private parts) with your normal soap.             6.  Wash thoroughly, paying special attention to the area where your surgery  will be performed.  7.  Thoroughly rinse your body with warm water from the neck down.  8.  DO NOT shower/wash with your normal soap after using and rinsing off  the CHG Soap.                9.  Pat yourself dry with a clean towel.            10.  Wear clean pajamas.            11.  Place clean sheets on your bed the night of your first shower and do not  sleep with pets. Day of Surgery : Do not apply any lotions/deodorants the morning of surgery.  Please wear clean clothes to the hospital/surgery center.  FAILURE TO FOLLOW THESE INSTRUCTIONS MAY RESULT IN THE CANCELLATION OF YOUR SURGERY PATIENT SIGNATURE_________________________________  NURSE SIGNATURE__________________________________  ________________________________________________________________________

## 2020-04-01 NOTE — Progress Notes (Signed)
COVID Vaccine Completed: Yes Date COVID Vaccine completed: 02/07/20. [place] COVID vaccine manufacturer: Pfizer     PCP - Dr. Shon Baton. Cardiologist -   Chest x-ray -  EKG -  Stress Test -  ECHO -  Cardiac Cath -  Pacemaker/ICD device last checked:  Sleep Study -  CPAP -   Fasting Blood Sugar -  Checks Blood Sugar _____ times a day  Blood Thinner Instructions: Aspirin Instructions: Last Dose:  Anesthesia review:   Patient denies shortness of breath, fever, cough and chest pain at PAT appointment   Patient verbalized understanding of instructions that were given to them at the PAT appointment. Patient was also instructed that they will need to review over the PAT instructions again at home before surgery.

## 2020-04-01 NOTE — Progress Notes (Signed)
Pt. Needs orders for upcomming surgery.PST and labs appointment on 04/01/20.Thanks.

## 2020-04-02 ENCOUNTER — Ambulatory Visit
Admission: RE | Admit: 2020-04-02 | Discharge: 2020-04-02 | Disposition: A | Discharge status: Home / Self Care | Payer: BLUE CROSS/BLUE SHIELD | Source: Ambulatory Visit | Attending: Orthopaedic Surgery | Admitting: Orthopaedic Surgery

## 2020-04-02 DIAGNOSIS — M545 Low back pain, unspecified: Secondary | ICD-10-CM | POA: Diagnosis not present

## 2020-04-02 DIAGNOSIS — M48061 Spinal stenosis, lumbar region without neurogenic claudication: Secondary | ICD-10-CM | POA: Diagnosis not present

## 2020-04-02 DIAGNOSIS — M5442 Lumbago with sciatica, left side: Secondary | ICD-10-CM

## 2020-04-02 DIAGNOSIS — G8929 Other chronic pain: Secondary | ICD-10-CM

## 2020-04-07 ENCOUNTER — Other Ambulatory Visit (HOSPITAL_COMMUNITY)
Admission: RE | Admit: 2020-04-07 | Discharge: 2020-04-07 | Disposition: A | Discharge status: Home / Self Care | Payer: BLUE CROSS/BLUE SHIELD | Source: Ambulatory Visit | Attending: General Surgery | Admitting: General Surgery

## 2020-04-07 DIAGNOSIS — Z8249 Family history of ischemic heart disease and other diseases of the circulatory system: Secondary | ICD-10-CM | POA: Diagnosis not present

## 2020-04-07 DIAGNOSIS — K8020 Calculus of gallbladder without cholecystitis without obstruction: Secondary | ICD-10-CM | POA: Diagnosis not present

## 2020-04-07 DIAGNOSIS — Z6823 Body mass index (BMI) 23.0-23.9, adult: Secondary | ICD-10-CM | POA: Diagnosis not present

## 2020-04-07 DIAGNOSIS — Z01812 Encounter for preprocedural laboratory examination: Secondary | ICD-10-CM | POA: Insufficient documentation

## 2020-04-07 DIAGNOSIS — Z87891 Personal history of nicotine dependence: Secondary | ICD-10-CM | POA: Diagnosis not present

## 2020-04-07 DIAGNOSIS — Z20822 Contact with and (suspected) exposure to covid-19: Secondary | ICD-10-CM | POA: Insufficient documentation

## 2020-04-07 DIAGNOSIS — K8010 Calculus of gallbladder with chronic cholecystitis without obstruction: Secondary | ICD-10-CM | POA: Diagnosis not present

## 2020-04-07 DIAGNOSIS — Z800 Family history of malignant neoplasm of digestive organs: Secondary | ICD-10-CM | POA: Diagnosis not present

## 2020-04-07 DIAGNOSIS — Z01419 Encounter for gynecological examination (general) (routine) without abnormal findings: Secondary | ICD-10-CM | POA: Diagnosis not present

## 2020-04-07 LAB — SARS CORONAVIRUS 2 (TAT 6-24 HRS): SARS Coronavirus 2: NEGATIVE

## 2020-04-09 ENCOUNTER — Ambulatory Visit: Payer: Self-pay | Admitting: General Surgery

## 2020-04-09 NOTE — H&P (View-Only) (Signed)
Otho Perl Appointment: 03/20/2020 9:45 AM Location: [facility] Patient #: [id number] DOB: [birth year] Married / Language: Cleophus Molt / Race: White Female   History of Present Illness Randall Hiss M. Hattye Siegfried MD; 03/20/2020 10:34 AM) The patient is a 49 year old female who presents for evaluation of gall stones. she is referred by Dr Collene Mares for upper abdominal pain and gallstones. She states about one and half years ago in the summer she had some unplanned weight loss of about 5 pounds and she started having stomach pain when she ate. She describes this pain was in her upper abdomen. It was generally occur about half an hour after eating. It would last for a few hours. She ended up seeing her gastroenterologist because of a family history of colon cancer and had upper and lower endoscopy she states. I do not have these records. She states that she had a benign polyp as well as a few stomach ulcers and was put on 6 months of omeprazole. She states that she weaned her self off but then she started having recurrent upper abdominal discomfort so she restarted it. She went back to see her GI doctor and they got an ultrasound which showed a 2 cm gallstone and she is referred here. She states that her episodes generally occur after eating, last for a few hours, generally says she with nausea. She is only had 1 severe attack where it caused her to double over. There prior Donnell surgery. She does report that her mother, dad and brother have had blood clots. She states that she thinks her brother had a genetic workup but does not know the results. She states that her PCP checked her for a blood clotting disorder about 6-7 years ago and was told it was negative. She has a few varicose veins but has not had a blood clot herself. She does not smoke. She is a Animal nutritionist on sabbatical. She denies any chest pain, chest pressure, source of breath, dyspnea on exertion   Problem List/Past Medical  Randall Hiss M. Redmond Pulling, MD; 03/20/2020 1:15 PM) FAMILY HISTORY OF BLOOD CLOTS (Z82.49)  SYMPTOMATIC CHOLELITHIASIS (K80.20)   Past Surgical History Lindwood Coke, RN; 03/20/2020 9:39 AM) Cesarean Section - Multiple  Oral Surgery   Diagnostic Studies History Lindwood Coke, RN; 03/20/2020 9:39 AM) Colonoscopy  1-5 years ago Mammogram  1-3 years ago Pap Smear  1-5 years ago  Allergies Lindwood Coke, RN; 03/20/2020 9:41 AM) No Known Drug Allergies  [03/20/2020]: Allergies Reconciled   Medication History (Diane Herrin, RN; 03/20/2020 9:42 AM) Albuterol Sulfate HFA (108 (90 Base)MCG/ACT Aerosol Soln, Inhalation) Active. Escitalopram Oxalate (10MG  Tablet, Oral) Active. Gabapentin (100MG  Capsule, Oral) Active. Roselyn Meier (50MG  Tablet, Oral) Active. Medications Reconciled  Social History Lindwood Coke, RN; 03/20/2020 9:39 AM) Alcohol use  Occasional alcohol use. No caffeine use  No drug use  Tobacco use  Former smoker.  Family History Lindwood Coke, RN; 03/20/2020 9:39 AM) Anesthetic complications  Brother. Arthritis  Mother. Colon Cancer  Family Members In General. Migraine Headache  Daughter, Mother, Son. Respiratory Condition  Brother, Daughter, Father.  Pregnancy / Birth History Lindwood Coke, RN; 03/20/2020 9:39 AM) Age at menarche  57 years. Contraceptive History  Oral contraceptives. Gravida  2 Length (months) of breastfeeding  7-12 Maternal age  77-30 Para  2 Regular periods   Other Problems Randall Hiss M. Redmond Pulling, MD; 03/20/2020 1:15 PM) Anxiety Disorder  Asthma  Back Pain  Cholelithiasis  Depression  Gastric Ulcer  Lump In Breast  Migraine Headache  Review of Systems (Diane Herrin RN; 03/20/2020 9:40 AM) General Not Present- Appetite Loss, Chills, Fatigue, Fever, Night Sweats, Weight Gain and Weight Loss. Skin Not Present- Change in Wart/Mole, Dryness, Hives, Jaundice, New Lesions, Non-Healing Wounds, Rash and Ulcer. HEENT Present-  Seasonal Allergies. Not Present- Earache, Hearing Loss, Hoarseness, Nose Bleed, Oral Ulcers, Ringing in the Ears, Sinus Pain, Sore Throat, Visual Disturbances, Wears glasses/contact lenses and Yellow Eyes. Respiratory Present- Snoring. Not Present- Bloody sputum, Chronic Cough, Difficulty Breathing and Wheezing. Breast Not Present- Breast Mass, Breast Pain, Nipple Discharge and Skin Changes. Cardiovascular Present- Palpitations. Not Present- Chest Pain, Difficulty Breathing Lying Down, Leg Cramps, Rapid Heart Rate, Shortness of Breath and Swelling of Extremities. Gastrointestinal Present- Abdominal Pain, Bloating, Excessive gas and Nausea. Not Present- Bloody Stool, Change in Bowel Habits, Chronic diarrhea, Constipation, Difficulty Swallowing, Gets full quickly at meals, Hemorrhoids, Indigestion, Rectal Pain and Vomiting. Female Genitourinary Not Present- Frequency, Nocturia, Painful Urination, Pelvic Pain and Urgency. Musculoskeletal Present- Back Pain. Not Present- Joint Pain, Joint Stiffness, Muscle Pain, Muscle Weakness and Swelling of Extremities. Neurological Present- Headaches. Not Present- Decreased Memory, Fainting, Numbness, Seizures, Tingling, Tremor, Trouble walking and Weakness. Psychiatric Present- Anxiety and Depression. Not Present- Bipolar, Change in Sleep Pattern, Fearful and Frequent crying. Endocrine Not Present- Cold Intolerance, Excessive Hunger, Hair Changes, Heat Intolerance, Hot flashes and New Diabetes. Hematology Not Present- Blood Thinners, Easy Bruising, Excessive bleeding, Gland problems, HIV and Persistent Infections.  Vitals (Diane Herrin RN; 03/20/2020 9:43 AM) 03/20/2020 9:42 AM Weight: 149.13 lb Height: 68in Body Surface Area: 1.8 m Body Mass Index: 22.67 kg/m  Temp.: 98.14F  Pulse: 97 (Regular)  P.OX: 97% (Room air) BP: 108/74(Sitting, Left Arm, Standard)       Physical Exam Randall Hiss M. Hiro Vipond MD; 03/20/2020 10:31 AM) General Mental  Status-Alert. General Appearance-Consistent with stated age. Hydration-Well hydrated. Voice-Normal.  Head and Neck Head-normocephalic, atraumatic with no lesions or palpable masses. Trachea-midline. Thyroid Gland Characteristics - normal size and consistency.  Eye Eyeball - Bilateral-Extraocular movements intact. Sclera/Conjunctiva - Bilateral-No scleral icterus.  Chest and Lung Exam Chest and lung exam reveals -quiet, even and easy respiratory effort with no use of accessory muscles and on auscultation, normal breath sounds, no adventitious sounds and normal vocal resonance. Inspection Chest Wall - Normal. Back - normal.  Breast - Did not examine.  Cardiovascular Cardiovascular examination reveals -normal heart sounds, regular rate and rhythm with no murmurs and normal pedal pulses bilaterally.  Abdomen Inspection Inspection of the abdomen reveals - No Hernias. Skin - Scar - no surgical scars. Palpation/Percussion Palpation and Percussion of the abdomen reveal - Soft, Non Tender, No Rebound tenderness, No Rigidity (guarding) and No hepatosplenomegaly. Auscultation Auscultation of the abdomen reveals - Bowel sounds normal.  Peripheral Vascular Upper Extremity Palpation - Pulses bilaterally normal.  Neurologic Neurologic evaluation reveals -alert and oriented x 3 with no impairment of recent or remote memory. Mental Status-Normal.  Neuropsychiatric The patient's mood and affect are described as -normal. Judgment and Insight-insight is appropriate concerning matters relevant to self.  Musculoskeletal Normal Exam - Left-Upper Extremity Strength Normal and Lower Extremity Strength Normal. Normal Exam - Right-Upper Extremity Strength Normal and Lower Extremity Strength Normal.  Lymphatic Head & Neck  General Head & Neck Lymphatics: Bilateral - Description - Normal. Axillary - Did not examine. Femoral & Inguinal - Did not  examine.    Assessment & Plan Randall Hiss M. Kohner Orlick MD; 03/20/2020 10:27 AM) SYMPTOMATIC CHOLELITHIASIS (K80.20) Impression: I believe the patient's symptoms are consistent with gallbladder disease.  We discussed gallbladder disease. The patient was given Neurosurgeon. We discussed non-operative and operative management. We discussed the signs & symptoms of acute cholecystitis  I discussed laparoscopic cholecystectomy with IOC in detail. The patient was given educational material as well as diagrams detailing the procedure. We discussed the risks and benefits of a laparoscopic cholecystectomy including, but not limited to bleeding, infection, injury to surrounding structures such as the intestine or liver, bile leak, retained gallstones, need to convert to an open procedure, prolonged diarrhea, blood clots such as DVT, common bile duct injury, anesthesia risks, and possible need for additional procedures. We discussed the typical post-operative recovery course. I explained that the likelihood of improvement of their symptoms is good.  The patient has elected to proceed with surgery.  This patient encounter took 32 minutes today to perform the following: take history, perform exam, review outside records, interpret imaging, counsel the patient on their diagnosis and document encounter, findings & plan in the EHR Current Plans Pt Education - Pamphlet Given - Laparoscopic Gallbladder Surgery: discussed with patient and provided information. You are being scheduled for surgery- Our schedulers will call you.  You should hear from our office's scheduling department within 5 working days about the location, date, and time of surgery. We try to make accommodations for patient's preferences in scheduling surgery, but sometimes the OR schedule or the surgeon's schedule prevents [place] from making those accommodations.  If you have not heard from our office [phone]) in 5 working days, call the  office and ask for your surgeon's nurse.  If you have other questions about your diagnosis, plan, or surgery, call the office and ask for your surgeon's nurse.  FAMILY HISTORY OF BLOOD CLOTS (Z82.49) Impression: three 1st degree relatives with blood clots (m,d,b). pretty sure her brother was tested for genetic but doesn't know results. She states that her PCP worked her up for a blood clotting disorder several years ago and was told that she didn't have a blood clotting disorder but she is unsure how many of the blood clotting disorders she was evaluated for. We'll attempt to get these old records. we may need to get a hematology consult to way and regarding her perioperative DVT prophylaxis recommendation  Leighton Ruff. Redmond Pulling, MD, FACS General, Bariatric, & Minimally Invasive Surgery [facility], [place]

## 2020-04-09 NOTE — H&P (Signed)
Chelsea Flores Appointment: 03/20/2020 9:45 AM Location: [facility] Patient #: [id number] DOB: August 25, 1970 Married / Language: Chelsea Flores / Race: White Female   History of Present Illness Randall Hiss M. Vernie Vinciguerra MD; 03/20/2020 10:34 AM) The patient is a 49 year old female who presents for evaluation of gall stones. she is referred by Dr Collene Mares for upper abdominal pain and gallstones. She states about one and half years ago in the summer she had some unplanned weight loss of about 5 pounds and she started having stomach pain when she ate. She describes this pain was in her upper abdomen. It was generally occur about half an hour after eating. It would last for a few hours. She ended up seeing her gastroenterologist because of a family history of colon cancer and had upper and lower endoscopy she states. I do not have these records. She states that she had a benign polyp as well as a few stomach ulcers and was put on 6 months of omeprazole. She states that she weaned her self off but then she started having recurrent upper abdominal discomfort so she restarted it. She went back to see her GI doctor and they got an ultrasound which showed a 2 cm gallstone and she is referred here. She states that her episodes generally occur after eating, last for a few hours, generally says she with nausea. She is only had 1 severe attack where it caused her to double over. There prior Donnell surgery. She does report that her mother, dad and brother have had blood clots. She states that she thinks her brother had a genetic workup but does not know the results. She states that her PCP checked her for a blood clotting disorder about 6-7 years ago and was told it was negative. She has a few varicose veins but has not had a blood clot herself. She does not smoke. She is a Animal nutritionist on sabbatical. She denies any chest pain, chest pressure, source of breath, dyspnea on exertion   Problem List/Past Medical  Randall Hiss M. Redmond Pulling, MD; 03/20/2020 1:15 PM) FAMILY HISTORY OF BLOOD CLOTS (Z82.49)  SYMPTOMATIC CHOLELITHIASIS (K80.20)   Past Surgical History Lindwood Coke, RN; 03/20/2020 9:39 AM) Cesarean Section - Multiple  Oral Surgery   Diagnostic Studies History Lindwood Coke, RN; 03/20/2020 9:39 AM) Colonoscopy  1-5 years ago Mammogram  1-3 years ago Pap Smear  1-5 years ago  Allergies Lindwood Coke, RN; 03/20/2020 9:41 AM) No Known Drug Allergies  [03/20/2020]: Allergies Reconciled   Medication History (Diane Herrin, RN; 03/20/2020 9:42 AM) Albuterol Sulfate HFA (108 (90 Base)MCG/ACT Aerosol Soln, Inhalation) Active. Escitalopram Oxalate (10MG  Tablet, Oral) Active. Gabapentin (100MG  Capsule, Oral) Active. Roselyn Meier (50MG  Tablet, Oral) Active. Medications Reconciled  Social History Lindwood Coke, RN; 03/20/2020 9:39 AM) Alcohol use  Occasional alcohol use. No caffeine use  No drug use  Tobacco use  Former smoker.  Family History Lindwood Coke, RN; 03/20/2020 9:39 AM) Anesthetic complications  Brother. Arthritis  Mother. Colon Cancer  Family Members In General. Migraine Headache  Daughter, Mother, Son. Respiratory Condition  Brother, Daughter, Father.  Pregnancy / Birth History Lindwood Coke, RN; 03/20/2020 9:39 AM) Age at menarche  55 years. Contraceptive History  Oral contraceptives. Gravida  2 Length (months) of breastfeeding  7-12 Maternal age  47-30 Para  2 Regular periods   Other Problems Randall Hiss M. Redmond Pulling, MD; 03/20/2020 1:15 PM) Anxiety Disorder  Asthma  Back Pain  Cholelithiasis  Depression  Gastric Ulcer  Lump In Breast  Migraine Headache  Review of Systems (Diane Herrin RN; 03/20/2020 9:40 AM) General Not Present- Appetite Loss, Chills, Fatigue, Fever, Night Sweats, Weight Gain and Weight Loss. Skin Not Present- Change in Wart/Mole, Dryness, Hives, Jaundice, New Lesions, Non-Healing Wounds, Rash and Ulcer. HEENT Present-  Seasonal Allergies. Not Present- Earache, Hearing Loss, Hoarseness, Nose Bleed, Oral Ulcers, Ringing in the Ears, Sinus Pain, Sore Throat, Visual Disturbances, Wears glasses/contact lenses and Yellow Eyes. Respiratory Present- Snoring. Not Present- Bloody sputum, Chronic Cough, Difficulty Breathing and Wheezing. Breast Not Present- Breast Mass, Breast Pain, Nipple Discharge and Skin Changes. Cardiovascular Present- Palpitations. Not Present- Chest Pain, Difficulty Breathing Lying Down, Leg Cramps, Rapid Heart Rate, Shortness of Breath and Swelling of Extremities. Gastrointestinal Present- Abdominal Pain, Bloating, Excessive gas and Nausea. Not Present- Bloody Stool, Change in Bowel Habits, Chronic diarrhea, Constipation, Difficulty Swallowing, Gets full quickly at meals, Hemorrhoids, Indigestion, Rectal Pain and Vomiting. Female Genitourinary Not Present- Frequency, Nocturia, Painful Urination, Pelvic Pain and Urgency. Musculoskeletal Present- Back Pain. Not Present- Joint Pain, Joint Stiffness, Muscle Pain, Muscle Weakness and Swelling of Extremities. Neurological Present- Headaches. Not Present- Decreased Memory, Fainting, Numbness, Seizures, Tingling, Tremor, Trouble walking and Weakness. Psychiatric Present- Anxiety and Depression. Not Present- Bipolar, Change in Sleep Pattern, Fearful and Frequent crying. Endocrine Not Present- Cold Intolerance, Excessive Hunger, Hair Changes, Heat Intolerance, Hot flashes and New Diabetes. Hematology Not Present- Blood Thinners, Easy Bruising, Excessive bleeding, Gland problems, HIV and Persistent Infections.  Vitals (Diane Herrin RN; 03/20/2020 9:43 AM) 03/20/2020 9:42 AM Weight: 149.13 lb Height: 68in Body Surface Area: 1.8 m Body Mass Index: 22.67 kg/m  Temp.: 98.71F  Pulse: 97 (Regular)  P.OX: 97% (Room air) BP: 108/74(Sitting, Left Arm, Standard)       Physical Exam Randall Hiss M. Anselmo Reihl MD; 03/20/2020 10:31 AM) General Mental  Status-Alert. General Appearance-Consistent with stated age. Hydration-Well hydrated. Voice-Normal.  Head and Neck Head-normocephalic, atraumatic with no lesions or palpable masses. Trachea-midline. Thyroid Gland Characteristics - normal size and consistency.  Eye Eyeball - Bilateral-Extraocular movements intact. Sclera/Conjunctiva - Bilateral-No scleral icterus.  Chest and Lung Exam Chest and lung exam reveals -quiet, even and easy respiratory effort with no use of accessory muscles and on auscultation, normal breath sounds, no adventitious sounds and normal vocal resonance. Inspection Chest Wall - Normal. Back - normal.  Breast - Did not examine.  Cardiovascular Cardiovascular examination reveals -normal heart sounds, regular rate and rhythm with no murmurs and normal pedal pulses bilaterally.  Abdomen Inspection Inspection of the abdomen reveals - No Hernias. Skin - Scar - no surgical scars. Palpation/Percussion Palpation and Percussion of the abdomen reveal - Soft, Non Tender, No Rebound tenderness, No Rigidity (guarding) and No hepatosplenomegaly. Auscultation Auscultation of the abdomen reveals - Bowel sounds normal.  Peripheral Vascular Upper Extremity Palpation - Pulses bilaterally normal.  Neurologic Neurologic evaluation reveals -alert and oriented x 3 with no impairment of recent or remote memory. Mental Status-Normal.  Neuropsychiatric The patient's mood and affect are described as -normal. Judgment and Insight-insight is appropriate concerning matters relevant to self.  Musculoskeletal Normal Exam - Left-Upper Extremity Strength Normal and Lower Extremity Strength Normal. Normal Exam - Right-Upper Extremity Strength Normal and Lower Extremity Strength Normal.  Lymphatic Head & Neck  General Head & Neck Lymphatics: Bilateral - Description - Normal. Axillary - Did not examine. Femoral & Inguinal - Did not  examine.    Assessment & Plan Randall Hiss M. Anuhea Gassner MD; 03/20/2020 10:27 AM) SYMPTOMATIC CHOLELITHIASIS (K80.20) Impression: I believe the patient's symptoms are consistent with gallbladder disease.  We discussed gallbladder disease. The patient was given Neurosurgeon. We discussed non-operative and operative management. We discussed the signs & symptoms of acute cholecystitis  I discussed laparoscopic cholecystectomy with IOC in detail. The patient was given educational material as well as diagrams detailing the procedure. We discussed the risks and benefits of a laparoscopic cholecystectomy including, but not limited to bleeding, infection, injury to surrounding structures such as the intestine or liver, bile leak, retained gallstones, need to convert to an open procedure, prolonged diarrhea, blood clots such as DVT, common bile duct injury, anesthesia risks, and possible need for additional procedures. We discussed the typical post-operative recovery course. I explained that the likelihood of improvement of their symptoms is good.  The patient has elected to proceed with surgery.  This patient encounter took 32 minutes today to perform the following: take history, perform exam, review outside records, interpret imaging, counsel the patient on their diagnosis and document encounter, findings & plan in the EHR Current Plans Pt Education - Pamphlet Given - Laparoscopic Gallbladder Surgery: discussed with patient and provided information. You are being scheduled for surgery- Our schedulers will call you.  You should hear from our office's scheduling department within 5 working days about the location, date, and time of surgery. We try to make accommodations for patient's preferences in scheduling surgery, but sometimes the OR schedule or the surgeon's schedule prevents [place] from making those accommodations.  If you have not heard from our office [phone]) in 5 working days, call the  office and ask for your surgeon's nurse.  If you have other questions about your diagnosis, plan, or surgery, call the office and ask for your surgeon's nurse.  FAMILY HISTORY OF BLOOD CLOTS (Z82.49) Impression: three 1st degree relatives with blood clots (m,d,b). pretty sure her brother was tested for genetic but doesn't know results. She states that her PCP worked her up for a blood clotting disorder several years ago and was told that she didn't have a blood clotting disorder but she is unsure how many of the blood clotting disorders she was evaluated for. We'll attempt to get these old records. we may need to get a hematology consult to way and regarding her perioperative DVT prophylaxis recommendation  Leighton Ruff. Redmond Pulling, MD, FACS General, Bariatric, & Minimally Invasive Surgery [facility], [place]

## 2020-04-10 ENCOUNTER — Ambulatory Visit (HOSPITAL_COMMUNITY): Payer: BLUE CROSS/BLUE SHIELD | Admitting: Anesthesiology

## 2020-04-10 ENCOUNTER — Encounter (HOSPITAL_COMMUNITY)
Admission: RE | Disposition: A | Discharge status: Home / Self Care | Payer: Self-pay | Source: Home / Self Care | Attending: General Surgery

## 2020-04-10 ENCOUNTER — Encounter (HOSPITAL_COMMUNITY): Payer: Self-pay | Admitting: General Surgery

## 2020-04-10 ENCOUNTER — Ambulatory Visit (HOSPITAL_COMMUNITY)
Admission: RE | Admit: 2020-04-10 | Discharge: 2020-04-10 | Disposition: A | Discharge status: Home / Self Care | Payer: BLUE CROSS/BLUE SHIELD | Attending: General Surgery | Admitting: General Surgery

## 2020-04-10 ENCOUNTER — Other Ambulatory Visit: Payer: Self-pay

## 2020-04-10 DIAGNOSIS — J45909 Unspecified asthma, uncomplicated: Secondary | ICD-10-CM | POA: Diagnosis not present

## 2020-04-10 DIAGNOSIS — Z800 Family history of malignant neoplasm of digestive organs: Secondary | ICD-10-CM | POA: Diagnosis not present

## 2020-04-10 DIAGNOSIS — Z87891 Personal history of nicotine dependence: Secondary | ICD-10-CM | POA: Diagnosis not present

## 2020-04-10 DIAGNOSIS — Z20822 Contact with and (suspected) exposure to covid-19: Secondary | ICD-10-CM | POA: Insufficient documentation

## 2020-04-10 DIAGNOSIS — K8010 Calculus of gallbladder with chronic cholecystitis without obstruction: Secondary | ICD-10-CM | POA: Diagnosis not present

## 2020-04-10 DIAGNOSIS — Z8249 Family history of ischemic heart disease and other diseases of the circulatory system: Secondary | ICD-10-CM | POA: Insufficient documentation

## 2020-04-10 DIAGNOSIS — K8020 Calculus of gallbladder without cholecystitis without obstruction: Secondary | ICD-10-CM | POA: Diagnosis not present

## 2020-04-10 DIAGNOSIS — F418 Other specified anxiety disorders: Secondary | ICD-10-CM | POA: Diagnosis not present

## 2020-04-10 LAB — PREGNANCY, URINE: Preg Test, Ur: NEGATIVE

## 2020-04-10 SURGERY — CHOLECYSTECTOMY, ROBOT-ASSISTED, LAPAROSCOPIC
Anesthesia: General | Site: Abdomen

## 2020-04-10 MED ORDER — LACTATED RINGERS IV SOLN: INTRAVENOUS | Status: DC | PRN

## 2020-04-10 MED ORDER — ONDANSETRON HCL 4 MG/2ML IJ SOLN
4.0000 mg | Freq: Once | INTRAMUSCULAR | Status: AC | PRN
  Administered 2020-04-10: 4 mg via INTRAVENOUS

## 2020-04-10 MED ORDER — EPHEDRINE SULFATE-NACL 50-0.9 MG/10ML-% IV SOSY
PREFILLED_SYRINGE | INTRAVENOUS | Status: DC | PRN
  Administered 2020-04-10: 10 mg via INTRAVENOUS
  Administered 2020-04-10: 5 mg via INTRAVENOUS
  Administered 2020-04-10: 10 mg via INTRAVENOUS

## 2020-04-10 MED ORDER — ENOXAPARIN SODIUM 40 MG/0.4ML ~~LOC~~ SOLN
40.0000 mg | Freq: Once | SUBCUTANEOUS | Status: AC
  Administered 2020-04-10: 40 mg via SUBCUTANEOUS
  Filled 2020-04-10: qty 0.4

## 2020-04-10 MED ORDER — PHENYLEPHRINE 40 MCG/ML (10ML) SYRINGE FOR IV PUSH (FOR BLOOD PRESSURE SUPPORT)
PREFILLED_SYRINGE | INTRAVENOUS | Status: DC | PRN
  Administered 2020-04-10 (×2): 40 ug via INTRAVENOUS

## 2020-04-10 MED ORDER — INDOCYANINE GREEN 25 MG IV SOLR
2.5000 mg | Freq: Once | INTRAVENOUS | Status: AC
  Administered 2020-04-10: 2.5 mg via INTRAVENOUS
  Filled 2020-04-10: qty 1

## 2020-04-10 MED ORDER — GABAPENTIN 300 MG PO CAPS
300.0000 mg | ORAL_CAPSULE | ORAL | Status: AC
  Administered 2020-04-10: 300 mg via ORAL
  Filled 2020-04-10: qty 1

## 2020-04-10 MED ORDER — CHLORHEXIDINE GLUCONATE CLOTH 2 % EX PADS: 6.0000 | MEDICATED_PAD | Freq: Once | CUTANEOUS | Status: DC

## 2020-04-10 MED ORDER — LACTATED RINGERS IV SOLN: INTRAVENOUS | Status: DC

## 2020-04-10 MED ORDER — ROCURONIUM BROMIDE 10 MG/ML (PF) SYRINGE
PREFILLED_SYRINGE | INTRAVENOUS | Status: DC | PRN
  Administered 2020-04-10: 60 mg via INTRAVENOUS

## 2020-04-10 MED ORDER — FENTANYL CITRATE (PF) 100 MCG/2ML IJ SOLN
25.0000 ug | INTRAMUSCULAR | Status: DC | PRN
  Administered 2020-04-10: 50 ug via INTRAVENOUS

## 2020-04-10 MED ORDER — ORAL CARE MOUTH RINSE: 15.0000 mL | Freq: Once | OROMUCOSAL | Status: AC

## 2020-04-10 MED ORDER — INDOCYANINE GREEN 25 MG IV SOLR: 7.5000 mg | Freq: Once | INTRAVENOUS | Status: DC

## 2020-04-10 MED ORDER — 0.9 % SODIUM CHLORIDE (POUR BTL) OPTIME
TOPICAL | Status: DC | PRN
  Administered 2020-04-10: 1000 mL

## 2020-04-10 MED ORDER — ACETAMINOPHEN 500 MG PO TABS
1000.0000 mg | ORAL_TABLET | ORAL | Status: AC
  Administered 2020-04-10: 1000 mg via ORAL
  Filled 2020-04-10: qty 2

## 2020-04-10 MED ORDER — LACTATED RINGERS IR SOLN
Status: DC | PRN
  Administered 2020-04-10: 1000 mL

## 2020-04-10 MED ORDER — SUGAMMADEX SODIUM 200 MG/2ML IV SOLN
INTRAVENOUS | Status: DC | PRN
  Administered 2020-04-10: 200 mg via INTRAVENOUS

## 2020-04-10 MED ORDER — KETOROLAC TROMETHAMINE 30 MG/ML IJ SOLN: 30.0000 mg | Freq: Once | INTRAMUSCULAR | Status: DC | PRN

## 2020-04-10 MED ORDER — FENTANYL CITRATE (PF) 100 MCG/2ML IJ SOLN
INTRAMUSCULAR | Status: DC | PRN
  Administered 2020-04-10: 50 ug via INTRAVENOUS
  Administered 2020-04-10: 100 ug via INTRAVENOUS

## 2020-04-10 MED ORDER — ENSURE PRE-SURGERY PO LIQD
296.0000 mL | Freq: Once | ORAL | Status: DC
  Filled 2020-04-10: qty 296

## 2020-04-10 MED ORDER — OXYCODONE HCL 5 MG PO TABS: 5.0000 mg | ORAL_TABLET | Freq: Once | ORAL | Status: DC | PRN

## 2020-04-10 MED ORDER — SCOPOLAMINE 1 MG/3DAYS TD PT72
MEDICATED_PATCH | TRANSDERMAL | Status: AC
  Administered 2020-04-10: 1.5 mg
  Filled 2020-04-10: qty 1

## 2020-04-10 MED ORDER — ENOXAPARIN SODIUM 40 MG/0.4ML ~~LOC~~ SOLN: 40.0000 mg | Freq: Once | SUBCUTANEOUS | 0 refills | Status: DC

## 2020-04-10 MED ORDER — BUPIVACAINE-EPINEPHRINE 0.25% -1:200000 IJ SOLN
INTRAMUSCULAR | Status: DC | PRN
  Administered 2020-04-10: 30 mL

## 2020-04-10 MED ORDER — PROPOFOL 10 MG/ML IV BOLUS
INTRAVENOUS | Status: DC | PRN
  Administered 2020-04-10: 150 mg via INTRAVENOUS

## 2020-04-10 MED ORDER — OXYCODONE HCL 5 MG PO TABS: 5.0000 mg | ORAL_TABLET | Freq: Four times a day (QID) | ORAL | 0 refills | Status: DC | PRN

## 2020-04-10 MED ORDER — LIDOCAINE 2% (20 MG/ML) 5 ML SYRINGE
INTRAMUSCULAR | Status: DC | PRN
  Administered 2020-04-10: 100 mg via INTRAVENOUS

## 2020-04-10 MED ORDER — MIDAZOLAM HCL 2 MG/2ML IJ SOLN
INTRAMUSCULAR | Status: AC
  Filled 2020-04-10: qty 2

## 2020-04-10 MED ORDER — CHLORHEXIDINE GLUCONATE 0.12 % MT SOLN
15.0000 mL | Freq: Once | OROMUCOSAL | Status: AC
  Administered 2020-04-10: 15 mL via OROMUCOSAL

## 2020-04-10 MED ORDER — MIDAZOLAM HCL 5 MG/5ML IJ SOLN
INTRAMUSCULAR | Status: DC | PRN
  Administered 2020-04-10: 2 mg via INTRAVENOUS

## 2020-04-10 MED ORDER — SODIUM CHLORIDE 0.9 % IV SOLN
2.0000 g | INTRAVENOUS | Status: AC
  Administered 2020-04-10: 2 g via INTRAVENOUS
  Filled 2020-04-10: qty 2

## 2020-04-10 MED ORDER — OXYCODONE HCL 5 MG/5ML PO SOLN: 5.0000 mg | Freq: Once | ORAL | Status: DC | PRN

## 2020-04-10 MED ORDER — BUPIVACAINE-EPINEPHRINE (PF) 0.25% -1:200000 IJ SOLN
INTRAMUSCULAR | Status: AC
  Filled 2020-04-10: qty 30

## 2020-04-10 MED ORDER — ONDANSETRON HCL 4 MG/2ML IJ SOLN
INTRAMUSCULAR | Status: AC
  Filled 2020-04-10: qty 2

## 2020-04-10 MED ORDER — ONDANSETRON HCL 4 MG/2ML IJ SOLN
INTRAMUSCULAR | Status: DC | PRN
  Administered 2020-04-10: 4 mg via INTRAVENOUS

## 2020-04-10 MED ORDER — DEXAMETHASONE SODIUM PHOSPHATE 4 MG/ML IJ SOLN: 4.0000 mg | INTRAMUSCULAR | Status: DC

## 2020-04-10 MED ORDER — FENTANYL CITRATE (PF) 250 MCG/5ML IJ SOLN
INTRAMUSCULAR | Status: AC
  Filled 2020-04-10: qty 5

## 2020-04-10 MED ORDER — ACETAMINOPHEN 500 MG PO TABS
ORAL_TABLET | ORAL | Status: AC
  Filled 2020-04-10: qty 1

## 2020-04-10 MED ORDER — DEXAMETHASONE SODIUM PHOSPHATE 10 MG/ML IJ SOLN
INTRAMUSCULAR | Status: DC | PRN
  Administered 2020-04-10: 4 mg via INTRAVENOUS

## 2020-04-10 MED ORDER — ACETAMINOPHEN 500 MG PO TABS: 1000.0000 mg | ORAL_TABLET | Freq: Three times a day (TID) | ORAL | 0 refills | Status: AC

## 2020-04-10 MED ORDER — FENTANYL CITRATE (PF) 100 MCG/2ML IJ SOLN
INTRAMUSCULAR | Status: AC
  Filled 2020-04-10: qty 2

## 2020-04-10 SURGICAL SUPPLY — 60 items
APPLIER CLIP 5 13 M/L LIGAMAX5 (MISCELLANEOUS)
BLADE SURG SZ11 CARB STEEL (BLADE) ×3 IMPLANT
CANNULA REDUC XI 12-8 STAPL (CANNULA) ×2
CANNULA REDUC XI 12-8MM STAPL (CANNULA) ×1
CANNULA REDUCER 12-8 DVNC XI (CANNULA) ×1 IMPLANT
CHLORAPREP W/TINT 26 (MISCELLANEOUS) ×3 IMPLANT
CLIP APPLIE 5 13 M/L LIGAMAX5 (MISCELLANEOUS) IMPLANT
CLIP VESOLOCK LG 6/CT PURPLE (CLIP) ×3 IMPLANT
CLOSURE WOUND 1/2 X4 (GAUZE/BANDAGES/DRESSINGS) ×1
COVER TIP SHEARS 8 DVNC (MISCELLANEOUS) ×1 IMPLANT
COVER TIP SHEARS 8MM DA VINCI (MISCELLANEOUS) ×3
COVER WAND RF STERILE (DRAPES) ×3 IMPLANT
DECANTER SPIKE VIAL GLASS SM (MISCELLANEOUS) ×3 IMPLANT
DRAPE ARM DVNC X/XI (DISPOSABLE) ×4 IMPLANT
DRAPE COLUMN DVNC XI (DISPOSABLE) ×1 IMPLANT
DRAPE DA VINCI XI ARM (DISPOSABLE) ×12
DRAPE DA VINCI XI COLUMN (DISPOSABLE) ×3
DRSG TEGADERM 2-3/8X2-3/4 SM (GAUZE/BANDAGES/DRESSINGS) ×9 IMPLANT
DRSG TEGADERM 4X4.75 (GAUZE/BANDAGES/DRESSINGS) ×3 IMPLANT
ELECT REM PT RETURN 15FT ADLT (MISCELLANEOUS) ×3 IMPLANT
GAUZE SPONGE 2X2 8PLY STRL LF (GAUZE/BANDAGES/DRESSINGS) ×1 IMPLANT
GLOVE BIO SURGEON STRL SZ7.5 (GLOVE) ×6 IMPLANT
GLOVE INDICATOR 8.0 STRL GRN (GLOVE) ×6 IMPLANT
GOWN STRL REUS W/TWL XL LVL3 (GOWN DISPOSABLE) ×6 IMPLANT
GRASPER SUT TROCAR 14GX15 (MISCELLANEOUS) IMPLANT
IRRIGATOR SUCT 8 DISP DVNC XI (IRRIGATION / IRRIGATOR) IMPLANT
IRRIGATOR SUCTION 8MM XI DISP (IRRIGATION / IRRIGATOR)
KIT BASIN OR (CUSTOM PROCEDURE TRAY) ×3 IMPLANT
KIT PROCEDURE DA VINCI SI (MISCELLANEOUS)
KIT PROCEDURE DVNC SI (MISCELLANEOUS) IMPLANT
KIT TURNOVER KIT A (KITS) ×3 IMPLANT
MANIFOLD NEPTUNE II (INSTRUMENTS) ×3 IMPLANT
NEEDLE HYPO 22GX1.5 SAFETY (NEEDLE) ×3 IMPLANT
NEEDLE INSUFFLATION 14GA 120MM (NEEDLE) IMPLANT
PACK CARDIOVASCULAR III (CUSTOM PROCEDURE TRAY) ×3 IMPLANT
POUCH RETRIEVAL ECOSAC 10 (ENDOMECHANICALS) ×1 IMPLANT
POUCH RETRIEVAL ECOSAC 10MM (ENDOMECHANICALS) ×3
SEAL CANN UNIV 5-8 DVNC XI (MISCELLANEOUS) ×4 IMPLANT
SEAL XI 5MM-8MM UNIVERSAL (MISCELLANEOUS) ×12
SET IRRIG TUBING LAPAROSCOPIC (IRRIGATION / IRRIGATOR) IMPLANT
SOL ANTI FOG 6CC (MISCELLANEOUS) ×1 IMPLANT
SOLUTION ANTI FOG 6CC (MISCELLANEOUS) ×2
SOLUTION ELECTROLUBE (MISCELLANEOUS) ×3 IMPLANT
SPONGE GAUZE 2X2 STER 10/PKG (GAUZE/BANDAGES/DRESSINGS) ×2
SPONGE LAP 18X18 RF (DISPOSABLE) ×3 IMPLANT
STAPLER CANNULA SEAL DVNC XI (STAPLE) IMPLANT
STAPLER CANNULA SEAL XI (STAPLE)
STRIP CLOSURE SKIN 1/2X4 (GAUZE/BANDAGES/DRESSINGS) ×2 IMPLANT
SUT MNCRL AB 4-0 PS2 18 (SUTURE) ×3 IMPLANT
SUT VICRYL 0 TIES 12 18 (SUTURE) IMPLANT
SUT VICRYL 0 UR6 27IN ABS (SUTURE) IMPLANT
SYR 10ML ECCENTRIC (SYRINGE) ×3 IMPLANT
SYR 20ML LL LF (SYRINGE) ×3 IMPLANT
SYS RETRIEVAL 5MM INZII UNIV (BASKET)
SYSTEM RETRIEVL 5MM INZII UNIV (BASKET) IMPLANT
TOWEL OR 17X26 10 PK STRL BLUE (TOWEL DISPOSABLE) ×3 IMPLANT
TOWEL OR NON WOVEN STRL DISP B (DISPOSABLE) ×3 IMPLANT
TRAY FOLEY MTR SLVR 16FR STAT (SET/KITS/TRAYS/PACK) IMPLANT
TROCAR BLADELESS OPT 5 100 (ENDOMECHANICALS) IMPLANT
TUBING INSUFFLATION 10FT LAP (TUBING) ×3 IMPLANT

## 2020-04-10 NOTE — Discharge Instructions (Signed)
[facility] LAPAROSCOPIC/Robotic SURGERY: POST OP INSTRUCTIONS Always review your discharge instruction sheet given to you by the facility where your surgery was performed. IF YOU HAVE DISABILITY OR FAMILY LEAVE FORMS, YOU MUST BRING THEM TO THE OFFICE FOR PROCESSING.   DO NOT GIVE THEM TO YOUR DOCTOR.  PAIN CONTROL  1. First take acetaminophen (Tylenol) AND/or ibuprofen (Advil) to control your pain after surgery.  Follow directions on package.  Taking acetaminophen (Tylenol) and/or ibuprofen (Advil) regularly after surgery will help to control your pain and lower the amount of prescription pain medication you may need.  You should not take more than 3,000 mg (3 grams) of acetaminophen (Tylenol) in 24 hours.  You should not take ibuprofen (Advil), aleve, motrin, naprosyn or other NSAIDS if you have a history of stomach ulcers or chronic kidney disease.  2. A prescription for pain medication may be given to you upon discharge.  Take your pain medication as prescribed, if you still have uncontrolled pain after taking acetaminophen (Tylenol) or ibuprofen (Advil). 3. Use ice packs to help control pain. 4. If you need a refill on your pain medication, please contact your pharmacy.  They will contact our office to request authorization. Prescriptions will not be filled after 5pm or on week-ends.  HOME MEDICATIONS 5. Take your usually prescribed medications unless otherwise directed.  DIET 6. You should follow a light diet the first few days after arrival home.  Be sure to include lots of fluids daily. Avoid fatty, fried foods.   CONSTIPATION 7. It is common to experience some constipation after surgery and if you are taking pain medication.  Increasing fluid intake and taking a stool softener (such as Colace) will usually help or prevent this problem from occurring.  A mild laxative (Milk of Magnesia or Miralax) should be taken according to package instructions if there are no  bowel movements after 48 hours.  WOUND/INCISION CARE 8. Most patients will experience some swelling and bruising in the area of the incisions.  Ice packs will help.  Swelling and bruising can take several days to resolve.  9. Unless discharge instructions indicate otherwise, follow guidelines below  a. STERI-STRIPS - you may remove your outer bandages 48 hours after surgery, and you may shower at that time.  You have steri-strips (small skin tapes) in place directly over the incision.  These strips should be left on the skin for 7-10 days.   b. DERMABOND/SKIN GLUE - you may shower in 24 hours.  The glue will flake off over the next 2-3 weeks. 10. Any sutures or staples will be removed at the office during your follow-up visit.  ACTIVITIES 11. You may resume regular (light) daily activities beginning the next day--such as daily self-care, walking, climbing stairs--gradually increasing activities as tolerated.  You may have sexual intercourse when it is comfortable.  Refrain from any heavy lifting or straining until approved by your doctor. a. You may drive when you are no longer taking prescription pain medication, you can comfortably wear a seatbelt, and you can safely maneuver your car and apply brakes.  FOLLOW-UP 12. You should see your doctor in the office for a follow-up appointment approximately 2-3 weeks after your surgery.  You should have been given your post-op/follow-up appointment when your surgery was scheduled.  If you did not receive a post-op/follow-up appointment, make sure that you call for this appointment within a day or two after you arrive home to insure a convenient appointment time.  OTHER  INSTRUCTIONS 13. See lovenox injection instructions below 14. GET UP and WALK AROUND HOURLY DURING the DAY  WHEN TO CALL YOUR DOCTOR: 1. Fever over 101.0 2. Inability to urinate 3. Continued bleeding from incision. 4. Increased pain, redness, or drainage from the  incision. 5. Increasing abdominal pain  The clinic staff is available to answer your questions during regular business hours.  Please don't hesitate to call and ask to speak to one of the nurses for clinical concerns.  If you have a medical emergency, go to the nearest emergency room or call 911.  A surgeon from [facility] is always on call at the hospital. [address] [place], [place], [place]  [address] ? [address], [place], [place]   [address] [phone] ? [phone] ? FAX [phone] Web site: [email or web address]  Enoxaparin injection What is this medicine? ENOXAPARIN (ee nox a PA rin) is used after knee, hip, or abdominal surgeries to prevent blood clotting. It is also used to treat existing blood clots in the lungs or in the veins. This medicine may be used for other purposes; ask your health care provider or pharmacist if you have questions. COMMON BRAND NAME(S): Lovenox What should I tell my health care provider before I take this medicine? They need to know if you have any of these conditions:  bleeding disorders, hemorrhage, or hemophilia  infection of the heart or heart valves  kidney or liver disease  previous stroke  prosthetic heart valve  recent surgery or delivery of a baby  ulcer in the stomach or intestine, diverticulitis, or other bowel disease  an unusual or allergic reaction to enoxaparin, heparin, pork or pork products, other medicines, foods, dyes, or preservatives  pregnant or trying to get pregnant  breast-feeding How should I use this medicine? This medicine is for injection under the skin. It is usually given by a health-care professional. You or a family member may be trained on how to give the injections. If you are to give yourself injections, make sure you understand how to use the syringe, measure the dose if necessary, and give the injection. To avoid bruising, do not rub the site where this medicine has  been injected. Do not take your medicine more often than directed. Do not stop taking except on the advice of your doctor or health care professional. Make sure you receive a puncture-resistant container to dispose of the needles and syringes once you have finished with them. Do not reuse these items. Return the container to your doctor or health care professional for proper disposal. Talk to your pediatrician regarding the use of this medicine in children. Special care may be needed. Overdosage: If you think you have taken too much of this medicine contact a poison control center or emergency room at once. NOTE: This medicine is only for you. Do not share this medicine with others. What if I miss a dose? If you miss a dose, take it as soon as you can. If it is almost time for your next dose, take only that dose. Do not take double or extra doses. What may interact with this medicine?  aspirin and aspirin-like medicines  certain medicines that treat or prevent blood clots  dipyridamole  NSAIDs, medicines for pain and inflammation, like ibuprofen or naproxen This list may not describe all possible interactions. Give your health care provider a list of all the medicines, herbs, non-prescription drugs, or dietary supplements you use. Also tell them if you smoke, drink  alcohol, or use illegal drugs. Some items may interact with your medicine. What should I watch for while using this medicine? Visit your healthcare professional for regular checks on your progress. You may need blood work done while you are taking this medicine. Your condition will be monitored carefully while you are receiving this medicine. It is important not to miss any appointments. If you are going to need surgery or other procedure, tell your healthcare professional that you are using this medicine. Using this medicine for a long time may weaken your bones and increase the risk of bone fractures. Avoid sports and activities  that might cause injury while you are using this medicine. Severe falls or injuries can cause unseen bleeding. Be careful when using sharp tools or knives. Consider using an Copy. Take special care brushing or flossing your teeth. Report any injuries, bruising, or red spots on the skin to your healthcare professional. Wear a medical ID bracelet or chain. Carry a card that describes your disease and details of your medicine and dosage times. What side effects may I notice from receiving this medicine? Side effects that you should report to your doctor or health care professional as soon as possible:  allergic reactions like skin rash, itching or hives, swelling of the face, lips, or tongue  bone pain  signs and symptoms of bleeding such as bloody or black, tarry stools; red or dark-brown urine; spitting up blood or brown material that looks like coffee grounds; red spots on the skin; unusual bruising or bleeding from the eye, gums, or nose  signs and symptoms of a blood clot such as chest pain; shortness of breath; pain, swelling, or warmth in the leg  signs and symptoms of a stroke such as changes in vision; confusion; trouble speaking or understanding; severe headaches; sudden numbness or weakness of the face, arm or leg; trouble walking; dizziness; loss of coordination Side effects that usually do not require medical attention (report to your doctor or health care professional if they continue or are bothersome):  hair loss  pain, redness, or irritation at site where injected This list may not describe all possible side effects. Call your doctor for medical advice about side effects. You may report side effects to FDA at 1-800-FDA-1088. Where should I keep my medicine? Keep out of the reach of children. Store at room temperature between 15 and 30 degrees C (59 and 86 degrees F). Do not freeze. If your injections have been specially prepared, you may need to store them in the  refrigerator. Ask your pharmacist. Throw away any unused medicine after the expiration date. NOTE: This sheet is a summary. It may not cover all possible information. If you have questions about this medicine, talk to your doctor, pharmacist, or health care provider.  2020 Elsevier/Gold Standard (2017-04-21 11:25:34)

## 2020-04-10 NOTE — Anesthesia Postprocedure Evaluation (Signed)
Anesthesia Post Note  Patient: Clinical biochemist  Procedure(s) Performed: XI ROBOTIC ASSISTED LAPAROSCOPIC CHOLECYSTECTOMY WITH ICG (N/A Abdomen)     Patient location during evaluation: PACU Anesthesia Type: General Level of consciousness: awake and alert Pain management: pain level controlled Vital Signs Assessment: post-procedure vital signs reviewed and stable Respiratory status: spontaneous breathing, nonlabored ventilation, respiratory function stable and patient connected to nasal cannula oxygen Cardiovascular status: blood pressure returned to baseline and stable Postop Assessment: no apparent nausea or vomiting Anesthetic complications: no   No complications documented.  Last Vitals:  Vitals:   04/10/20 1615 04/10/20 1639  BP: 103/70 112/72  Pulse: 72 65  Resp: 18   Temp:  37.1 C  SpO2: 100% 96%    Last Pain:  Vitals:   04/10/20 1639  TempSrc: Oral  PainSc: 3                  Wilkie Zenon S

## 2020-04-10 NOTE — Anesthesia Preprocedure Evaluation (Signed)
Anesthesia Evaluation  Patient identified by MRN, date of birth, ID band Patient awake    Reviewed: Allergy & Precautions, NPO status , Patient's Chart, lab work & pertinent test results  History of Anesthesia Complications (+) PONV  Airway Mallampati: II  TM Distance: >3 FB Neck ROM: Full    Dental no notable dental hx.    Pulmonary asthma ,    Pulmonary exam normal breath sounds clear to auscultation       Cardiovascular negative cardio ROS Normal cardiovascular exam Rhythm:Regular Rate:Normal     Neuro/Psych Anxiety negative neurological ROS     GI/Hepatic negative GI ROS, Neg liver ROS,   Endo/Other  negative endocrine ROS  Renal/GU negative Renal ROS  negative genitourinary   Musculoskeletal negative musculoskeletal ROS (+)   Abdominal   Peds negative pediatric ROS (+)  Hematology negative hematology ROS (+)   Anesthesia Other Findings   Reproductive/Obstetrics negative OB ROS                             Anesthesia Physical Anesthesia Plan  ASA: II  Anesthesia Plan: General   Post-op Pain Management:    Induction: Intravenous  PONV Risk Score and Plan: 4 or greater and Ondansetron, Dexamethasone, Midazolam, Scopolamine patch - Pre-op and Treatment may vary due to age or medical condition  Airway Management Planned: Oral ETT  Additional Equipment:   Intra-op Plan:   Post-operative Plan: Extubation in OR  Informed Consent: I have reviewed the patients History and Physical, chart, labs and discussed the procedure including the risks, benefits and alternatives for the proposed anesthesia with the patient or authorized representative who has indicated his/her understanding and acceptance.     Dental advisory given  Plan Discussed with: CRNA and Surgeon  Anesthesia Plan Comments:         Anesthesia Quick Evaluation

## 2020-04-10 NOTE — Op Note (Signed)
Robotic/Xi Cholecystectomy with Firefly immunofluorescence procedure Note   Indications: This patient presents with symptomatic gallbladder disease and will undergo laparoscopic cholecystectomy.  Pre-operative Diagnosis: symptomatic cholelithiasis   Post-operative Diagnosis: same, probable chronic calculous cholecystitis   Surgeon: Greer Pickerel MD FACS   Assistants: none   Anesthesia: General endotracheal anesthesia   Procedure Details  The patient was seen again in the Holding Room. The risks, benefits, complications, treatment options, and expected outcomes were discussed with the patient. The possibilities of reaction to medication, pulmonary aspiration, perforation of viscus, bleeding, recurrent infection, finding a normal gallbladder, the need for additional procedures, failure to diagnose a condition, the possible need to convert to an open procedure, and creating a complication requiring transfusion or operation were discussed with the patient. The likelihood of improving the patient's symptoms with return to their baseline status is good.  The patient and/or family concurred with the proposed plan, giving informed consent. The site of surgery properly noted. The patient was taken to Operating Room, identified as [name] and the procedure verified as robotic Cholecystectomy. A Time Out was held and the above information confirmed. Antibiotic prophylaxis was administered.  She was also given a dose of 40 mg of subcutaneous Lovenox preoperatively.    Prior to the induction of general anesthesia, antibiotic prophylaxis was administered. General endotracheal anesthesia was then administered and tolerated well. After the induction, the abdomen was prepped with Chloraprep and draped in the sterile fashion. The patient was positioned in the supine position.   Local anesthetic agent was injected into the skin near the umbilicus and an incision made. We dissected down to the  abdominal fascia with blunt dissection.  The fascia was incised vertically and we entered the peritoneal cavity bluntly.  A pursestring suture of 0-Vicryl was placed around the fascial opening.  A 91mm robotic Hasson cannula was inserted and secured with the stay suture.  Pneumoperitoneum was then created with CO2 and tolerated well without any adverse changes in the patient's vital signs.  The robotic camera was inserted and the abdominal cavity was surveilled.  There is no evidence of injury to surrounding structures.  I then placed three additional 8 mm robotic trochars in a horizontal line in the lower abdomen.  Two trochars were placed on the right side of the abdomen and one in the left lower quadrant all under direct visualization.  I then infiltrated local along the lateral abdominal walls as a tap block.     We positioned the patient in reverse Trendelenburg, tilted slightly to the patient's left.    We then brought in the [employer] I robot and deployed it for upper abdominal surgery from patient's left.  The robotic camera was inserted and the anatomy was targeted.  The robotic arms were attached to the robotic trochars.  A forced bipolar, prograsp, and robotic hook were inserted under direct visualization.   I then scrubbed out and went to the robotic console while the scrub technician stayed at the bedside.    The gallbladder was identified, the fundus grasped and retracted cephalad.  Fair amount of omental adhesions was adhesed to the body of the gallbladder.  The duodenum was also tented up to the infundibulum of the gallbladder.  Adhesions were lysed bluntly and with the electrocautery where indicated, taking care not to injure any adjacent organs or viscus. The infundibulum was grasped and retracted laterally, exposing the peritoneum overlying the triangle of Calot. This was then divided and exposed in a  blunt fashion.  Firefly immunofluorescent was turned on several times during the  procedure as a secondary confirmation of the location of the cystic duct and common bile duct.  There was no obvious filling defect in the cystic duct or common bile duct.  A critical view of the cystic duct and cystic artery was obtained.  The cystic duct was clearly identified and bluntly dissected circumferentially.  My assistant performed instrument exchanges between the hook and the robotic hemoclips   The cystic duct was then ligated with 2 clips proximally & 1 distally and divided. The cystic artery  which had been identified & dissected free was ligated with 2 clips proximally and divided as well.    The gallbladder was dissected from the liver bed in retrograde fashion with the electrocautery.  There was no spillage of bile from the gallbladder. The gallbladder bed was inspected.Hemostasis was achieved with the electrocautery.   The robot was undocked.  I scrubbed back in. An ecco bag was inserted through the umbilical fascial defect under laparoscopic visualization and the gallbladder was placed within the bag and the gallbladder and bag were then removed from the umbilical fascial port site.  The [facility] trocar was replaced.  I reinspected the gallbladder fossa.  There is no evidence of bleeding.  No evidence of bile leak.  The [facility] trocar was removed.       the pursestring suture was used to close the umbilical fascia.   We again inspected the right upper quadrant for hemostasis.  The umbilical closure was inspected and there was no air leak and nothing trapped within the closure. Pneumoperitoneum was released as we removed the trocars.  4-0 Monocryl was used to close the skin.   steri-strips, and clean dressings were applied. The patient was then extubated and brought to the recovery room in stable condition. Instrument, sponge, and needle counts were correct at closure and at the conclusion of the case.    Findings: Probable chronic Cholecystitis with Cholelithiasis   Estimated Blood  Loss: Minimal         Drains: none         Specimens: Gallbladder           Complications: None; patient tolerated the procedure well.         Disposition: PACU - hemodynamically stable.         Condition: stable  Leighton Ruff. Redmond Pulling, MD, FACS General, Bariatric, & Minimally Invasive Surgery [facility], [place]

## 2020-04-10 NOTE — Transfer of Care (Signed)
Immediate Anesthesia Transfer of Care Note  Patient: Chelsea Flores  Procedure(s) Performed: XI ROBOTIC ASSISTED LAPAROSCOPIC CHOLECYSTECTOMY WITH ICG (N/A Abdomen)  Patient Location: PACU  Anesthesia Type:General  Level of Consciousness: drowsy and patient cooperative  Airway & Oxygen Therapy: Patient Spontanous Breathing and Patient connected to face mask oxygen  Post-op Assessment: Report given to RN and Post -op Vital signs reviewed and stable  Post vital signs: Reviewed and stable  Last Vitals:  Vitals Value Taken Time  BP 117/68 04/10/20 1503  Temp    Pulse 89 04/10/20 1506  Resp 16 04/10/20 1506  SpO2 100 % 04/10/20 1506  Vitals shown include unvalidated device data.  Last Pain:  Vitals:   04/10/20 1216  TempSrc:   PainSc: 0-No pain      Patients Stated Pain Goal: 3 (45/91/36 8599)  Complications: No complications documented.

## 2020-04-10 NOTE — Anesthesia Procedure Notes (Signed)
Procedure Name: Intubation Date/Time: 04/10/2020 1:57 PM Performed by: Montel Clock, CRNA Pre-anesthesia Checklist: Patient identified, Emergency Drugs available, Suction available, Patient being monitored and Timeout performed Patient Re-evaluated:Patient Re-evaluated prior to induction Oxygen Delivery Method: Circle system utilized Preoxygenation: Pre-oxygenation with 100% oxygen Induction Type: IV induction Ventilation: Mask ventilation without difficulty Laryngoscope Size: Mac and 3 Grade View: Grade I Tube type: Oral Tube size: 7.0 mm Number of attempts: 1 Airway Equipment and Method: Stylet Placement Confirmation: ETT inserted through vocal cords under direct vision,  positive ETCO2 and breath sounds checked- equal and bilateral Secured at: 23 cm Tube secured with: Tape Dental Injury: Teeth and Oropharynx as per pre-operative assessment

## 2020-04-10 NOTE — Interval H&P Note (Signed)
History and Physical Interval Note:  04/10/2020 1:27 PM  [name]  has presented today for surgery, with the diagnosis of symptomatic choleithasis.  The various methods of treatment have been discussed with the patient and family. After consideration of risks, benefits and other options for treatment, the patient has consented to  Procedure(s): XI ROBOTIC [place] (N/A) as a surgical intervention.  The patient's history has been reviewed, patient examined, no change in status, stable for surgery.  I have reviewed the patient's chart and labs.  Questions were  answered to the patient's satisfaction.     Only change in patient's medical condition from when I saw in the clinic was that she had an MRI her back and she states that she has a slipped disc at L4 and L5 and does have some paresthesias in her right lateral thigh.  Circulating nurse and I both heard this.  We advised her that we would be careful with moving her off of the OR table  Patient was given oral Tylenol and gabapentin.  She was also given subcutaneous Lovenox preoperatively.  I did discuss her family history of blood clotting with one of the hematologist.  They recommended 1 dose preoperatively and just 1 dose the day after surgery since the patient is very mobile.  Discussed use of the surgical robot  Leighton Ruff. Redmond Pulling, MD, FACS General, Bariatric, & Minimally Invasive Surgery [facility] [place]  Greer Pickerel

## 2020-04-11 LAB — SURGICAL PATHOLOGY

## 2020-04-21 ENCOUNTER — Ambulatory Visit: Payer: BLUE CROSS/BLUE SHIELD | Admitting: Orthopaedic Surgery

## 2020-04-21 ENCOUNTER — Encounter: Payer: Self-pay | Admitting: Orthopaedic Surgery

## 2020-04-21 DIAGNOSIS — M4807 Spinal stenosis, lumbosacral region: Secondary | ICD-10-CM | POA: Diagnosis not present

## 2020-04-21 NOTE — Progress Notes (Signed)
Chelsea Flores comes in today to go over the MRI of her lumbar spine.  When we obtain this MRI she felt a significant acute event with her back and had radicular symptoms going down the right side.  She has been in physical therapy and is doing better and feeling better.  There is no significant radicular symptoms today.  She denies any numbness and tingling in her legs or weakness.  On exam she has negative straight leg raise on the right side.  She has intact motor and sensory in both legs.  MRI does show a right central disc protrusion at L4-L5 but no canal or neuroforaminal stenosis.  I did go over the MRI with her in detail.  We talked about a home exercise program and being careful with lifting activities.  Right now, I would not recommend any other intervention such as an epidural steroid since she is improving and the disc that is a small bulge is contained.  However, if things worsen at all she will let us know.  All questions and concerns were answered and addressed.

## 2020-05-12 DIAGNOSIS — D225 Melanocytic nevi of trunk: Secondary | ICD-10-CM | POA: Diagnosis not present

## 2020-05-12 DIAGNOSIS — D2261 Melanocytic nevi of right upper limb, including shoulder: Secondary | ICD-10-CM | POA: Diagnosis not present

## 2020-05-12 DIAGNOSIS — D2271 Melanocytic nevi of right lower limb, including hip: Secondary | ICD-10-CM | POA: Diagnosis not present

## 2020-05-12 DIAGNOSIS — D2272 Melanocytic nevi of left lower limb, including hip: Secondary | ICD-10-CM | POA: Diagnosis not present

## 2020-11-04 DIAGNOSIS — Z800 Family history of malignant neoplasm of digestive organs: Secondary | ICD-10-CM | POA: Diagnosis not present

## 2020-11-04 DIAGNOSIS — Z86010 Personal history of colonic polyps: Secondary | ICD-10-CM | POA: Diagnosis not present

## 2020-11-04 DIAGNOSIS — K9089 Other intestinal malabsorption: Secondary | ICD-10-CM | POA: Diagnosis not present

## 2020-11-05 DIAGNOSIS — Z86010 Personal history of colonic polyps: Secondary | ICD-10-CM | POA: Diagnosis not present

## 2020-11-05 DIAGNOSIS — K9089 Other intestinal malabsorption: Secondary | ICD-10-CM | POA: Diagnosis not present

## 2020-11-05 DIAGNOSIS — Z800 Family history of malignant neoplasm of digestive organs: Secondary | ICD-10-CM | POA: Diagnosis not present

## 2020-12-31 DIAGNOSIS — Z0000 Encounter for general adult medical examination without abnormal findings: Secondary | ICD-10-CM | POA: Diagnosis not present

## 2021-01-06 DIAGNOSIS — D72819 Decreased white blood cell count, unspecified: Secondary | ICD-10-CM | POA: Diagnosis not present

## 2021-01-06 DIAGNOSIS — Z1331 Encounter for screening for depression: Secondary | ICD-10-CM | POA: Diagnosis not present

## 2021-01-06 DIAGNOSIS — Z0000 Encounter for general adult medical examination without abnormal findings: Secondary | ICD-10-CM | POA: Diagnosis not present

## 2021-01-06 DIAGNOSIS — R5383 Other fatigue: Secondary | ICD-10-CM | POA: Diagnosis not present

## 2021-01-06 DIAGNOSIS — Z1339 Encounter for screening examination for other mental health and behavioral disorders: Secondary | ICD-10-CM | POA: Diagnosis not present

## 2021-01-06 DIAGNOSIS — R7989 Other specified abnormal findings of blood chemistry: Secondary | ICD-10-CM | POA: Diagnosis not present

## 2021-01-06 DIAGNOSIS — L659 Nonscarring hair loss, unspecified: Secondary | ICD-10-CM | POA: Diagnosis not present

## 2021-01-29 IMAGING — MR MR LUMBAR SPINE W/O CM
4 of 5 series · 19 of 48 positions shown · non-contrast
Comparison: None.

CLINICAL DATA: Chronic low back pain radiating to the left foot

EXAM:
MRI LUMBAR SPINE WITHOUT CONTRAST
TECHNIQUE: Multiplanar, multisequence MR imaging of the lumbar spine was
performed. No intravenous contrast was administered.

[Series 5: T2 · sagittal · 4.0mm · 0.73mm/px · 6 of 15 slices shown (1 of 2)]
[im 1/15]
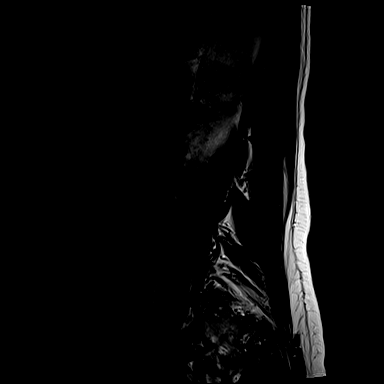
[im 3/15]
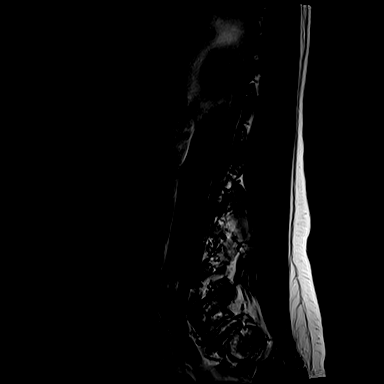
[im 6/15]
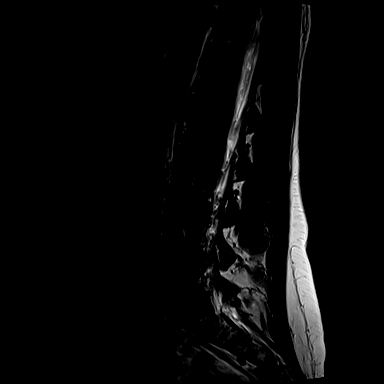
[im 9/15]
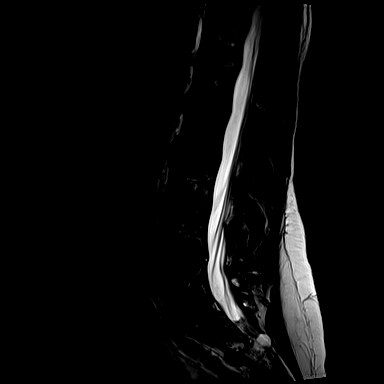
[im 12/15]
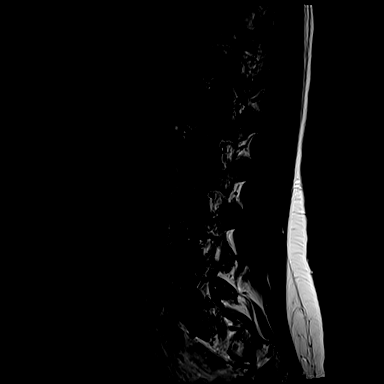
[im 15/15]
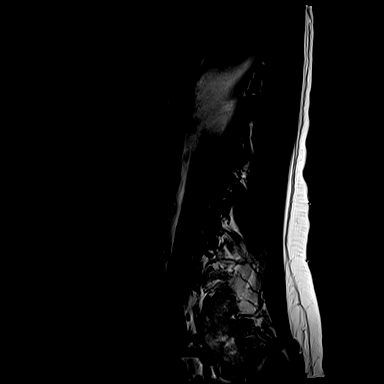

[Series 6: T1 · sagittal · 4.0mm · 0.73mm/px · 3 of 15 slices shown (1 of 2)]
[im 1/15]
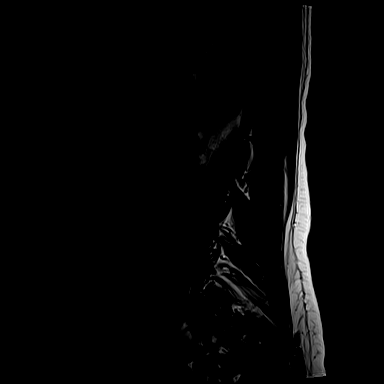
[im 8/15]
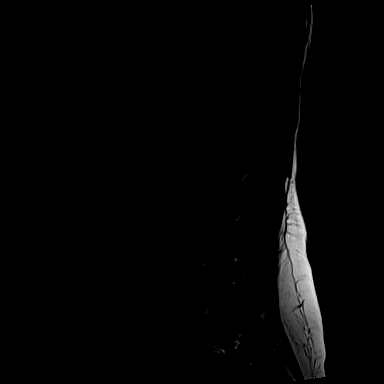
[im 15/15]
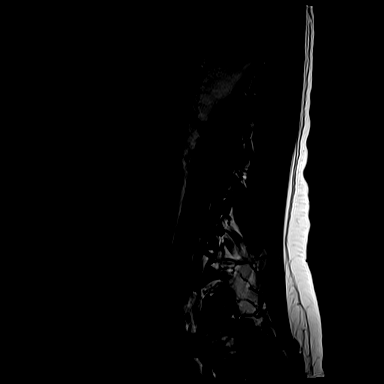

[Series 10: T1 · axial · 4.0mm · 0.28mm/px · z∈[-123,+56]mm · 3 of 44 slices shown (2 of 2)]
[im 6/44]
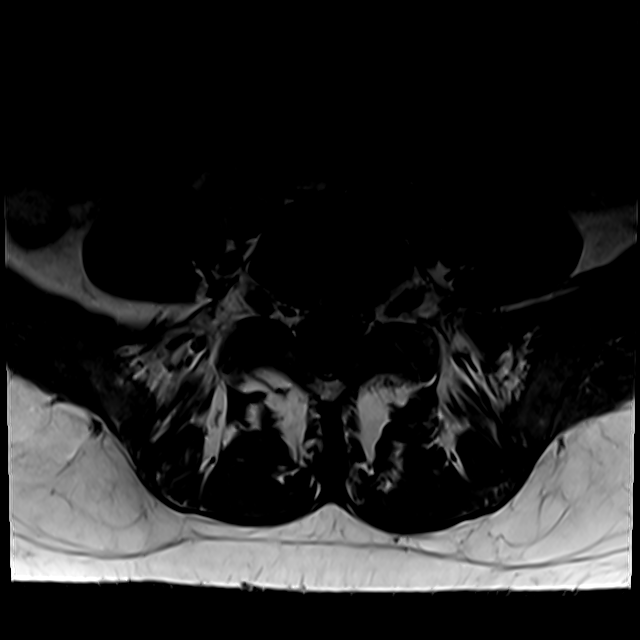
[im 23/44]
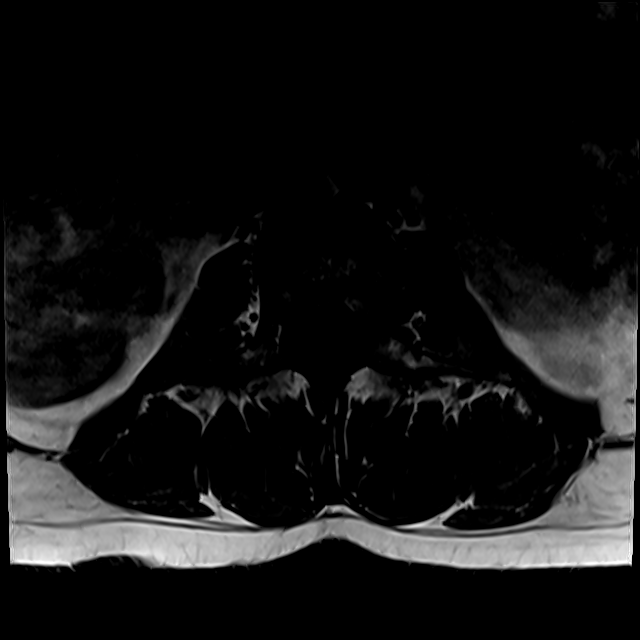
[im 38/44]
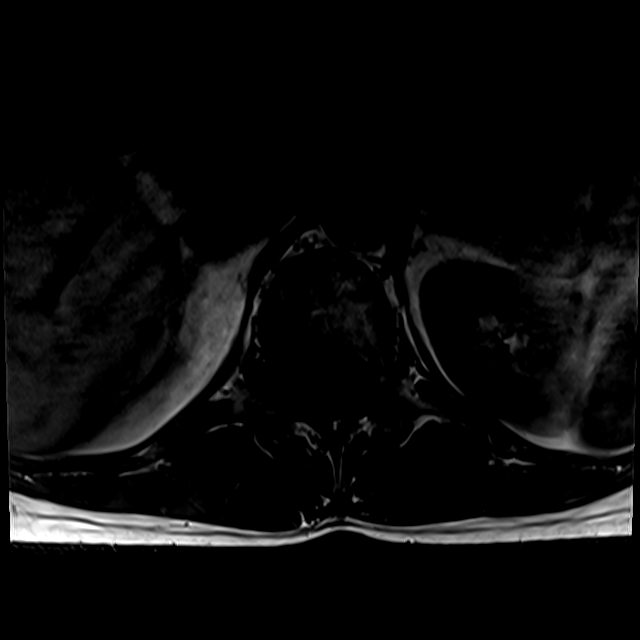

[Series 13: T2 · axial · 4.0mm · 0.28mm/px · z∈[-138,+56]mm · 7 of 44 slices shown (2 of 2)]
[im 3/44]
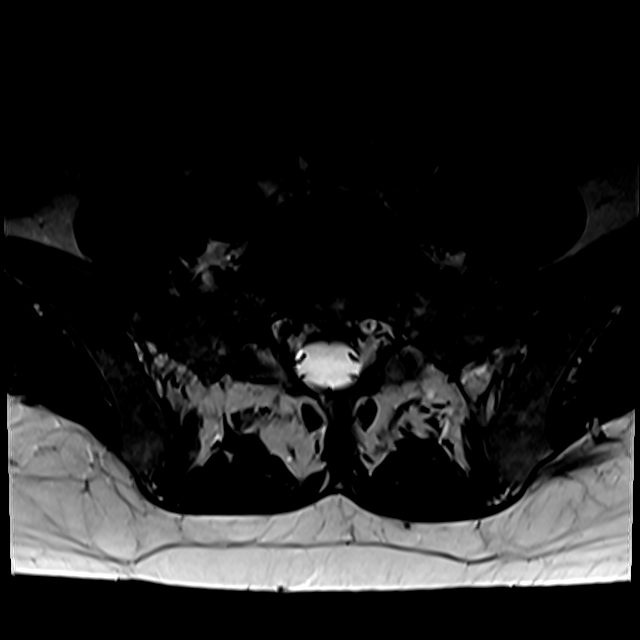
[im 6/44]
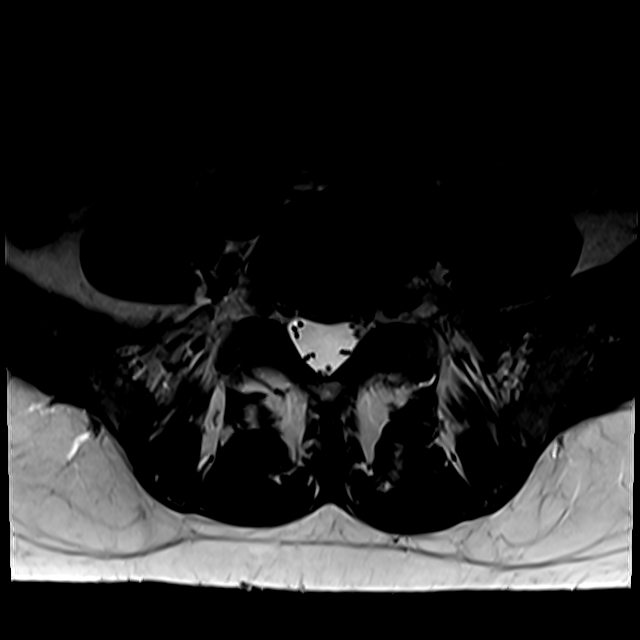
[im 9/44]
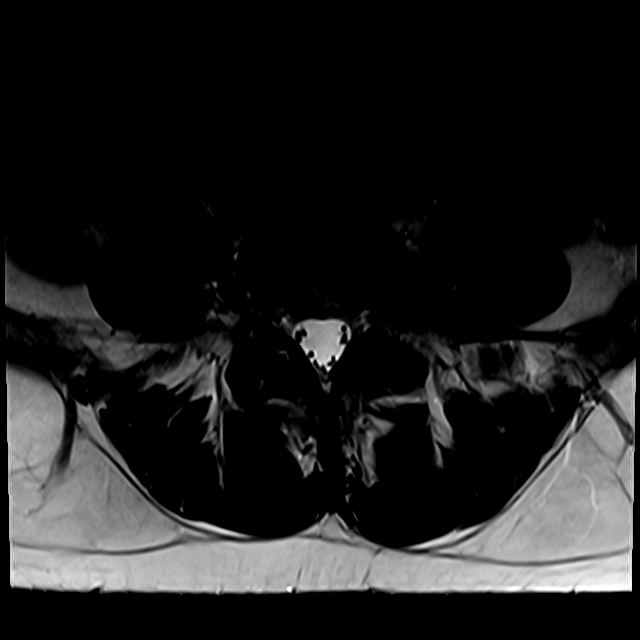
[im 15/44]
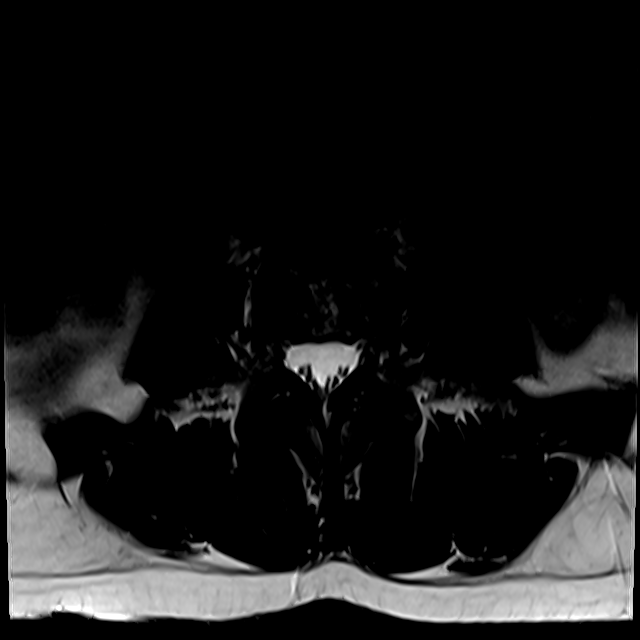
[im 21/44]
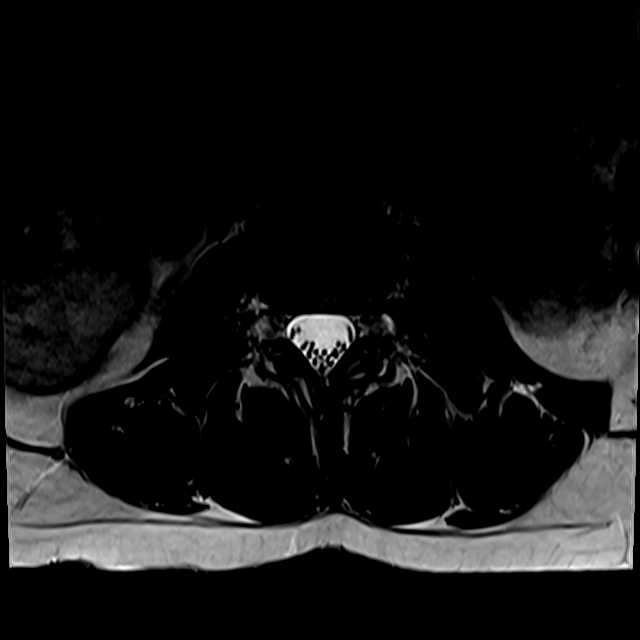
[im 23/44]
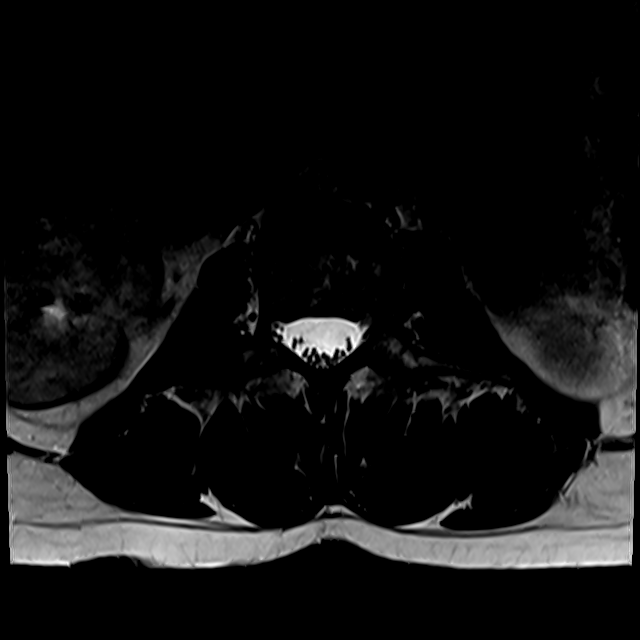
[im 38/44]
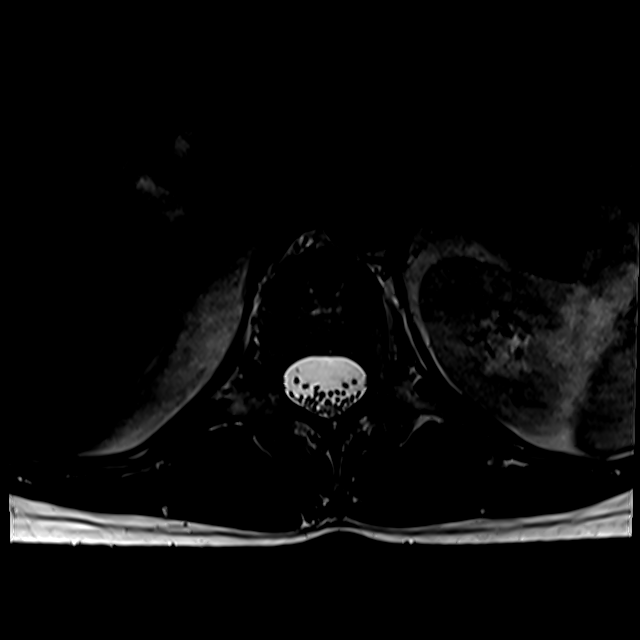

[19 of 48 positions shown; findings below may reference images not displayed]

FINDINGS: Segmentation:  Standard.

Alignment:  Physiologic.

Vertebrae:  No fracture, evidence of discitis, or bone lesion.

Conus medullaris and cauda equina: Conus extends to the L1 level.
Conus and cauda equina appear normal.

Paraspinal and other soft tissues: Negative.

Disc levels:

T11-12: Sagittal imaging only. Normal.

T12-L1: Normal disc space and facets. No spinal canal or
neuroforaminal stenosis.

L1-L2: Normal disc space and facets. No spinal canal or
neuroforaminal stenosis.

L2-L3: Normal disc space and facets. No spinal canal or
neuroforaminal stenosis.

L3-L4: Normal disc space and facets. No spinal canal or
neuroforaminal stenosis.

L4-L5: Small central disc protrusion annular fissure. No spinal
canal or neural foraminal stenosis.

L5-S1: Small central disc protrusion. No spinal canal or neural
foraminal stenosis.

Visualized sacrum: Normal.
IMPRESSION: Small central disc protrusions at L4-L5 and L5-S1 without spinal
canal or neural foraminal stenosis.

## 2021-02-11 ENCOUNTER — Other Ambulatory Visit: Payer: Self-pay | Admitting: Obstetrics and Gynecology

## 2021-02-11 DIAGNOSIS — Z1231 Encounter for screening mammogram for malignant neoplasm of breast: Secondary | ICD-10-CM

## 2021-03-30 ENCOUNTER — Other Ambulatory Visit: Payer: Self-pay

## 2021-03-30 ENCOUNTER — Ambulatory Visit
Admission: RE | Admit: 2021-03-30 | Discharge: 2021-03-30 | Disposition: A | Discharge status: Home / Self Care | Payer: BLUE CROSS/BLUE SHIELD | Source: Ambulatory Visit | Attending: Obstetrics and Gynecology | Admitting: Obstetrics and Gynecology

## 2021-03-30 DIAGNOSIS — Z1231 Encounter for screening mammogram for malignant neoplasm of breast: Secondary | ICD-10-CM | POA: Diagnosis not present

## 2021-04-15 DIAGNOSIS — Z6822 Body mass index (BMI) 22.0-22.9, adult: Secondary | ICD-10-CM | POA: Diagnosis not present

## 2021-04-15 DIAGNOSIS — Z01419 Encounter for gynecological examination (general) (routine) without abnormal findings: Secondary | ICD-10-CM | POA: Diagnosis not present

## 2021-06-03 DIAGNOSIS — D2261 Melanocytic nevi of right upper limb, including shoulder: Secondary | ICD-10-CM | POA: Diagnosis not present

## 2021-06-03 DIAGNOSIS — L814 Other melanin hyperpigmentation: Secondary | ICD-10-CM | POA: Diagnosis not present

## 2021-06-03 DIAGNOSIS — D225 Melanocytic nevi of trunk: Secondary | ICD-10-CM | POA: Diagnosis not present

## 2021-06-03 DIAGNOSIS — L573 Poikiloderma of Civatte: Secondary | ICD-10-CM | POA: Diagnosis not present

## 2021-10-26 DIAGNOSIS — G43909 Migraine, unspecified, not intractable, without status migrainosus: Secondary | ICD-10-CM | POA: Diagnosis not present

## 2021-10-26 DIAGNOSIS — Z825 Family history of asthma and other chronic lower respiratory diseases: Secondary | ICD-10-CM | POA: Diagnosis not present

## 2021-11-30 ENCOUNTER — Encounter: Payer: Self-pay | Admitting: Neurology

## 2021-11-30 ENCOUNTER — Ambulatory Visit (INDEPENDENT_AMBULATORY_CARE_PROVIDER_SITE_OTHER): Payer: BC Managed Care – PPO | Admitting: Neurology

## 2021-11-30 VITALS — BP 107/74 | HR 72 | Ht 68.0 in | Wt 151.2 lb

## 2021-11-30 DIAGNOSIS — G43709 Chronic migraine without aura, not intractable, without status migrainosus: Secondary | ICD-10-CM | POA: Diagnosis not present

## 2021-11-30 MED ORDER — RIZATRIPTAN BENZOATE 10 MG PO TBDP: 10.0000 mg | ORAL_TABLET | ORAL | 11 refills | Status: DC | PRN

## 2021-11-30 MED ORDER — AJOVY 225 MG/1.5ML ~~LOC~~ SOAJ: 225.0000 mg | SUBCUTANEOUS | 11 refills | Status: DC

## 2021-11-30 MED ORDER — NURTEC 75 MG PO TBDP: 75.0000 mg | ORAL_TABLET | Freq: Every day | ORAL | 0 refills | Status: DC | PRN

## 2021-11-30 NOTE — Patient Instructions (Addendum)
Start Ajovy as prevention Rizatriptan acutely: Please take one tablet at the onset of your headache. If it does not improve the symptoms please take one additional tablet. Do not take more then 2 tablets in 24hrs. Do not take use more then 2 to 3 times in a week. Can take it with Ubrelvy/Nurtec and Toradol Toradol injections for severe refractory migraines Continue Ubrelvy as needed Try Nurtec: daily as needed  Ketorolac Injection What is this medication? KETOROLAC (kee toe ROLE ak) treats short-term moderate to severe pain. It works by decreasing inflammation. It belongs to a group of medications called NSAIDs. This medicine may be used for other purposes; ask your health care provider or pharmacist if you have questions. COMMON BRAND NAME(S): Toradol What should I tell my care team before I take this medication? They need to know if you have any of these conditions: Bleeding disorder Coronary artery bypass graft (CABG) within the past 2 weeks Heart attack Heart disease Heart failure High blood pressure If you often drink alcohol Kidney disease Liver disease Lung or breathing disease, such as asthma or COPD Receiving steroids like dexamethasone or prednisone Smoke tobacco cigarettes Stomach bleeding Stomach ulcers, other stomach or intestine problems Take medication to treat or prevent blood clots An unusual or allergic reaction to ketorolac, other medications, foods, dyes, or preservatives Pregnant or trying to get pregnant Breast-feeding How should I use this medication? This medication is injected into a vein or muscle. It is given in a hospital or clinic setting. A special MedGuide will be given to you before each treatment. Be sure to read this information carefully each time. Talk to your care team about the use of this medication in children. Special care may be needed. Patients over 7 years of age may have a stronger reaction and need a smaller dose. Overdosage: If you  think you have taken too much of this medicine contact a poison control center or emergency room at once. NOTE: This medicine is only for you. Do not share this medicine with others. What if I miss a dose? This does not apply. This medication is not for regular use. What may interact with this medication? Do not take this medication with any of the following: Aspirin and aspirin-like medications Cidofovir Methotrexate NSAIDs, medications for pain and inflammation, like ibuprofen or naproxen Pentoxifylline Probenecid This medication may also interact with the following: Alcohol Alendronate Alprazolam Carbamazepine Diuretics Flavocoxid Fluoxetine Ginkgo lithium Medications for blood pressure like enalapril Medications that affect platelets like pentoxifylline Medications that treat or prevent blood clots like heparin, warfarin Muscle relaxants Pemetrexed Phenytoin Thiothixene This list may not describe all possible interactions. Give your health care provider a list of all the medicines, herbs, non-prescription drugs, or dietary supplements you use. Also tell them if you smoke, drink alcohol, or use illegal drugs. Some items may interact with your medicine. What should I watch for while using this medication? Your condition will be monitored carefully while you are receiving this medication. Do not use this medication for more than 5 days. It is only used for short-term treatment of moderate to severe pain. The risk of side effects such as kidney damage and stomach bleeding are higher if used for more than 5 days. Do not take other medications that contain aspirin, ibuprofen, or naproxen with this medication. Side effects such as stomach upset, nausea, or ulcers may be more likely to occur. Many non-prescription medications contain aspirin, ibuprofen, or naproxen. Always read labels carefully. This medication can cause  serious ulcers and bleeding in the stomach. It can happen with no  warning. Smoking, drinking alcohol, older age, and poor health can also increase risks. Call your care team right away if you have stomach pain or blood in your vomit or stool. Alcohol may interfere with the effect of this medication. Avoid alcoholic drinks. This medication may cause serious skin reactions. They can happen weeks to months after starting the medication. Contact your care team right away if you notice fevers or flu-like symptoms with a rash. The rash may be red or purple and then turn into blisters or peeling of the skin. Or, you might notice a red rash with swelling of the face, lips or lymph nodes in your neck or under your arms. Talk to your care team if you are pregnant before taking this medication. Taking this medication between weeks 20 and 30 of pregnancy may harm your unborn baby. Your care team will monitor you closely if you need to take it. After 30 weeks of pregnancy, do not take this medication. This medication does not prevent a heart attack or stroke. This medication may increase the chance of a heart attack or stroke. The chance may increase the longer you use this medication or if you have heart disease. If you take aspirin to prevent a heart attack or stroke, talk to your care team about using this medication. You may get drowsy or dizzy. Do not drive, use machinery, or do anything that needs mental alertness until you know how this medication affects you. Do not stand up or sit up quickly, especially if you are an older patient. This reduces the risk of dizzy or fainting spells. What side effects may I notice from receiving this medication? Side effects that you should report to your care team as soon as possible: Allergic reactions--skin rash, itching, hives, swelling of the face, lips, tongue, or throat Heart attack--pain or tightness in the chest, shoulders, arms, or jaw, nausea, shortness of breath, cold or clammy skin, feeling faint or lightheaded Heart  failure--shortness of breath, swelling of ankles, feet, or hands, sudden weight gain, unusual weakness or fatigue Increase in blood pressure Kidney injury--decrease in the amount of urine, swelling of the ankles, hands, or feet Liver injury--right upper belly pain, loss of appetite, nausea, light-colored stool, dark yellow or brown urine, yellowing skin or eyes, unusual weakness or fatigue Low red blood cell count--unusual weakness or fatigue, dizziness, headache, trouble breathing Rash, fever, and swollen lymph nodes Redness, blistering, peeling, or loosening of the skin, including inside the mouth Stomach bleeding--bloody or black, tar-like stools, vomiting blood or brown material that looks like coffee grounds Stroke--sudden numbness or weakness of the face, arm, or leg, trouble speaking, confusion, trouble walking, loss of balance or coordination, dizziness, severe headache, change in vision Side effects that usually do not require medical attention (report to your care team if they continue or are bothersome): Constipation Diarrhea Dizziness Headache Nausea Stomach pain This list may not describe all possible side effects. Call your doctor for medical advice about side effects. You may report side effects to FDA at 1-800-FDA-1088. Where should I keep my medication? This medication is given in a hospital or clinic. It will not be stored at home. NOTE: This sheet is a summary. It may not cover all possible information. If you have questions about this medicine, talk to your doctor, pharmacist, or health care provider.  2023 Elsevier/Gold Standard (2021-01-26 00:00:00)   Rimegepant Disintegrating Tablets What is this medication?  RIMEGEPANT (ri ME je pant) prevents and treats migraines. It works by blocking a substance in the body that causes migraines. This medicine may be used for other purposes; ask your health care provider or pharmacist if you have questions. COMMON BRAND NAME(S):  NURTEC ODT What should I tell my care team before I take this medication? They need to know if you have any of these conditions: Kidney disease Liver disease An unusual or allergic reaction to rimegepant, other medications, foods, dyes, or preservatives Pregnant or trying to get pregnant Breast-feeding How should I use this medication? Take this medication by mouth. Take it as directed on the prescription label. Leave the tablet in the sealed pack until you are ready to take it. With dry hands, open the pack and gently remove the tablet. If the tablet breaks or crumbles, throw it away. Use a new tablet. Place the tablet in the mouth and allow it to dissolve. Then, swallow it. Do not cut, crush, or chew this medication. You do not need water to take this medication. Talk to your care team about the use of this medication in children. Special care may be needed. Overdosage: If you think you have taken too much of this medicine contact a poison control center or emergency room at once. NOTE: This medicine is only for you. Do not share this medicine with others. What if I miss a dose? This does not apply. This medication is not for regular use. What may interact with this medication? Certain medications for fungal infections, such as fluconazole, itraconazole Rifampin This list may not describe all possible interactions. Give your health care provider a list of all the medicines, herbs, non-prescription drugs, or dietary supplements you use. Also tell them if you smoke, drink alcohol, or use illegal drugs. Some items may interact with your medicine. What should I watch for while using this medication? Visit your care team for regular checks on your progress. Tell your care team if your symptoms do not start to get better or if they get worse. What side effects may I notice from receiving this medication? Side effects that you should report to your care team as soon as possible: Allergic  reactions--skin rash, itching, hives, swelling of the face, lips, tongue, or throat Side effects that usually do not require medical attention (report to your care team if they continue or are bothersome): Nausea Stomach pain This list may not describe all possible side effects. Call your doctor for medical advice about side effects. You may report side effects to FDA at 1-800-FDA-1088. Where should I keep my medication? Keep out of the reach of children and pets. Store at room temperature between 20 and 25 degrees C (68 and 77 degrees F). Get rid of any unused medication after the expiration date. To get rid of medications that are no longer needed or have expired: Take the medication to a medication take-back program. Check with your pharmacy or law enforcement to find a location. If you cannot return the medication, check the label or package insert to see if the medication should be thrown out in the garbage or flushed down the toilet. If you are not sure, ask your care team. If it is safe to put it in the trash, take the medication out of the container. Mix the medication with cat litter, dirt, coffee grounds, or other unwanted substance. Seal the mixture in a bag or container. Put it in the trash. NOTE: This sheet is a summary. It may  not cover all possible information. If you have questions about this medicine, talk to your doctor, pharmacist, or health care provider.  2023 Elsevier/Gold Standard (2021-06-17 00:00:00)   Rizatriptan Disintegrating Tablets What is this medication? RIZATRIPTAN (rye za TRIP tan) treats migraines. It works by blocking pain signals and narrowing blood vessels in the brain. It belongs to a group of medications called triptans. It is not used to prevent migraines. This medicine may be used for other purposes; ask your health care provider or pharmacist if you have questions. COMMON BRAND NAME(S): Maxalt-MLT What should I tell my care team before I take this  medication? They need to know if you have any of these conditions: Cigarette smoker Circulation problems in fingers and toes Diabetes Heart disease High blood pressure High cholesterol History of irregular heartbeat History of stroke Kidney disease Liver disease Stomach or intestine problems An unusual or allergic reaction to rizatriptan, other medications, foods, dyes, or preservatives Pregnant or trying to get pregnant Breast-feeding How should I use this medication? Take this medication by mouth. Follow the directions on the prescription label. Leave the tablet in the sealed blister pack until you are ready to take it. With dry hands, open the blister and gently remove the tablet. If the tablet breaks or crumbles, throw it away and take a new tablet out of the blister pack. Place the tablet in the mouth and allow it to dissolve, and then swallow. Do not cut, crush, or chew this medication. You do not need water to take this medication. Do not take it more often than directed. Talk to your care team regarding the use of this medication in children. While this medication may be prescribed for children as young as 6 years for selected conditions, precautions do apply. Overdosage: If you think you have taken too much of this medicine contact a poison control center or emergency room at once. NOTE: This medicine is only for you. Do not share this medicine with others. What if I miss a dose? This does not apply. This medication is not for regular use. What may interact with this medication? Do not take this medication with any of the following medications: Certain medications for migraine headache like almotriptan, eletriptan, frovatriptan, naratriptan, rizatriptan, sumatriptan, zolmitriptan Ergot alkaloids like dihydroergotamine, ergonovine, ergotamine, methylergonovine MAOIs like Carbex, Eldepryl, Marplan, Nardil, and Parnate This medication may also interact with the following  medications: Certain medications for depression, anxiety, or psychotic disorders Propranolol This list may not describe all possible interactions. Give your health care provider a list of all the medicines, herbs, non-prescription drugs, or dietary supplements you use. Also tell them if you smoke, drink alcohol, or use illegal drugs. Some items may interact with your medicine. What should I watch for while using this medication? Visit your care team for regular checks on your progress. Tell your care team if your symptoms do not start to get better or if they get worse. You may get drowsy or dizzy. Do not drive, use machinery, or do anything that needs mental alertness until you know how this medication affects you. Do not stand up or sit up quickly, especially if you are an older patient. This reduces the risk of dizzy or fainting spells. Alcohol may interfere with the effect of this medication. Your mouth may get dry. Chewing sugarless gum or sucking hard candy and drinking plenty of water may help. Contact your care team if the problem does not go away or is severe. If you take migraine  medications for 10 or more days a month, your migraines may get worse. Keep a diary of headache days and medication use. Contact your care team if your migraine attacks occur more frequently. What side effects may I notice from receiving this medication? Side effects that you should report to your care team as soon as possible: Allergic reactions--skin rash, itching, hives, swelling of the face, lips, tongue, or throat Burning, pain, tingling, or color changes in the legs or feet Heart attack--pain or tightness in the chest, shoulders, arms, or jaw, nausea, shortness of breath, cold or clammy skin, feeling faint or lightheaded Heart rhythm changes--fast or irregular heartbeat, dizziness, feeling faint or lightheaded, chest pain, trouble breathing Increase in blood pressure Irritability, confusion, fast or irregular  heartbeat, muscle stiffness, twitching muscles, sweating, high fever, seizure, chills, vomiting, diarrhea, which may be signs of serotonin syndrome Raynaud's--cool, numb, or painful fingers or toes that may change color from pale, to blue, to red Seizures Stroke--sudden numbness or weakness of the face, arm, or leg, trouble speaking, confusion, trouble walking, loss of balance or coordination, dizziness, severe headache, change in vision Sudden or severe stomach pain, nausea, vomiting, fever, or bloody diarrhea Vision loss Side effects that usually do not require medical attention (report to your care team if they continue or are bothersome): Dizziness General discomfort or fatigue This list may not describe all possible side effects. Call your doctor for medical advice about side effects. You may report side effects to FDA at 1-800-FDA-1088. Where should I keep my medication? Keep out of the reach of children and pets. Store at room temperature between 15 and 30 degrees C (59 and 86 degrees F). Protect from light and moisture. Throw away any unused medication after the expiration date. NOTE: This sheet is a summary. It may not cover all possible information. If you have questions about this medicine, talk to your doctor, pharmacist, or health care provider.  2023 Elsevier/Gold Standard (2020-06-04 00:00:00) Rolanda Lundborg Injection What is this medication? FREMANEZUMAB (fre ma NEZ ue mab) prevents migraines. It works by blocking a substance in the body that causes migraines. It is a monoclonal antibody. This medicine may be used for other purposes; ask your health care provider or pharmacist if you have questions. COMMON BRAND NAME(S): AJOVY What should I tell my care team before I take this medication? They need to know if you have any of these conditions: An unusual or allergic reaction to fremanezumab, other medications, foods, dyes, or preservatives Pregnant or trying to get  pregnant Breast-feeding How should I use this medication? This medication is injected under the skin. You will be taught how to prepare and give it. Take it as directed on the prescription label. Keep taking it unless your care team tells you to stop. It is important that you put your used needles and syringes in a special sharps container. Do not put them in a trash can. If you do not have a sharps container, call your pharmacist or care team to get one. Talk to your care team about the use of this medication in children. Special care may be needed. Overdosage: If you think you have taken too much of this medicine contact a poison control center or emergency room at once. NOTE: This medicine is only for you. Do not share this medicine with others. What if I miss a dose? If you miss a dose, take it as soon as you can. If it is almost time for your next dose, take only  that dose. Do not take double or extra doses. What may interact with this medication? Interactions are not expected. This list may not describe all possible interactions. Give your health care provider a list of all the medicines, herbs, non-prescription drugs, or dietary supplements you use. Also tell them if you smoke, drink alcohol, or use illegal drugs. Some items may interact with your medicine. What should I watch for while using this medication? Tell your care team if your symptoms do not start to get better or if they get worse. What side effects may I notice from receiving this medication? Side effects that you should report to your care team as soon as possible: Allergic reactions or angioedema--skin rash, itching or hives, swelling of the face, eyes, lips, tongue, arms, or legs, trouble swallowing or breathing Side effects that usually do not require medical attention (report to your care team if they continue or are bothersome): Pain, redness, or irritation at injection site This list may not describe all possible side  effects. Call your doctor for medical advice about side effects. You may report side effects to FDA at 1-800-FDA-1088. Where should I keep my medication? Keep out of the reach of children and pets. Store in a refrigerator or at room temperature between 20 and 25 degrees C (68 and 77 degrees F). Refrigeration (preferred): Store in the refrigerator. Do not freeze. Keep in the original container until you are ready to take it. Remove the dose from the carton about 30 minutes before it is time for you to use it. If the dose is not used, it may be stored in the original container at room temperature for 7 days. Get rid of any unused medication after the expiration date. Room Temperature: This medication may be stored at room temperature for up to 7 days. Keep it in the original container. Protect from light until time of use. If it is stored at room temperature, get rid of any unused medication after 7 days or after it expires, whichever is first. To get rid of medications that are no longer needed or have expired: Take the medication to a medication take-back program. Check with your pharmacy or law enforcement to find a location. If you cannot return the medication, ask your pharmacist or care team how to get rid of this medication safely. NOTE: This sheet is a summary. It may not cover all possible information. If you have questions about this medicine, talk to your doctor, pharmacist, or health care provider.  2023 Elsevier/Gold Standard (2021-06-19 00:00:00)

## 2021-11-30 NOTE — Progress Notes (Unsigned)
[facility]    Provider:  Dr Jaynee Eagles Requesting Provider: Shon Baton, MD Primary Care Provider:  Shon Baton, MD  CC:  migraines  HPI:  Chelsea Flores is a 51 y.o. female here as requested by Shon Baton, MD for migraines. Started in college with vomiting, nausea, visual loss right lateral and dot on the left and has visual auras. She is well aware of stroke risk with migraine with aura. A big trigger is hormones. Caffeine is also a trigger. She is peri-menopausal, dehydration has been a trigger, taking too much advil has caused rebound and ulcers and now she does not take them, no medication overuse, in 2018 she would get 5-7 migraine in a week or two (not cluster headaches). She can get a dot in the center and slowly gets bigger and bigger when she has a visual aura. Her migraines are unilateral, pulsating/pounding/throbbing, movement makes it worse, unilateral, moderate to severe, can last upwards of 24-72 hours or longer untreated. Photophobia,phonophobia, daily headaches, she has a lot of neck tightness, she has pain where the muscles pop up when clenching. On average 15 mod to severe migraine days a month > 5 years. Every first degree relative has migraines. No changes in quality, not positional or exertional, no morning headaches, no vision changes. No other focal neurologic deficits, associated symptoms, inciting events or modifiable factors.  Reviewed notes, labs and imaging from outside physicians, which showed:  Tried: Gabapentin, Topiramate(hair loss, numbness and tingling and cannot perform surgery), tylenol, flexeril, decadron, lexapro, propranolol(asthmatic,contraindicated), amitriptyline/nortriptyline(sedation and weight gain), robaxin, nsaids, OTC analgesics, verapamil and other BP meds like carvedilol (contraindicated due to low blood pressure), imitrex, ubrelvy(helps), Aimovig contraindicated due to constipation, depakote.   Review of Systems: Patient  complains of symptoms per HPI as well as the following symptoms migraines. Pertinent negatives and positives per HPI. All others negative.   Social History   Socioeconomic History   Marital status: Married    Spouse name: Not on file   Number of children: Not on file   Years of education: Not on file   Highest education level: Not on file  Occupational History   Not on file  Tobacco Use   Smoking status: Never   Smokeless tobacco: Never  Vaping Use   Vaping Use: Never used  Substance and Sexual Activity   Alcohol use: Yes    Alcohol/week: 2.0 standard drinks of alcohol    Types: 2 Glasses of wine per week    Comment: occas.   Drug use: Never   Sexual activity: Not on file  Other Topics Concern   Not on file  Social History Narrative   Not on file   Social Determinants of Health   Financial Resource Strain: Not on file  Food Insecurity: Not on file  Transportation Needs: Not on file  Physical Activity: Not on file  Stress: Not on file  Social Connections: Not on file  Intimate Partner Violence: Not on file    Family History  Problem Relation Age of Onset   Migraines Mother    Migraines Father    Migraines Brother    Migraines Son    Migraines Daughter     Past Medical History:  Diagnosis Date   Anxiety    Asthma    Complication of anesthesia    Depression    Dyspnea    asthma flaring   Dysrhythmia    PONV (postoperative nausea and vomiting)     Patient Active Problem List  Diagnosis Date Noted   Chronic migraine without aura without status migrainosus, not intractable 11/30/2021   Left foot pain 01/09/2016   Hip pain 04/13/2012    Past Surgical History:  Procedure Laterality Date   CESAREAN SECTION     cist     removed from thumb   TUBAL LIGATION      Current Outpatient Medications  Medication Sig Dispense Refill   eszopiclone (LUNESTA) 2 MG TABS tablet Lunesta 2 mg tablet  Take 1 tablet as needed by oral route at bedtime.      Fremanezumab-vfrm (AJOVY) 225 MG/1.5ML SOAJ Inject 225 mg into the skin every 30 (thirty) days. 1.5 mL 11   ketorolac (TORADOL) 60 MG/2ML SOLN injection Inject 1-4m (30-'60mg'$ ) intramuscularly at onset of migraine. May repeat in 6 hours. Max twice a day and 4 days per month. 10 mL 4   Pediatric Multiple Vitamins (FLINTSTONES MULTIVITAMIN PO) Take 1 tablet by mouth daily.     Rimegepant Sulfate (NURTEC) 75 MG TBDP Take 75 mg by mouth daily as needed. For migraines. Take as close to onset of migraine as possible. One daily maximum. 10 tablet 0   rizatriptan (MAXALT-MLT) 10 MG disintegrating tablet Take 1 tablet (10 mg total) by mouth as needed for migraine. May repeat in 2 hours if needed 9 tablet 11   spironolactone (ALDACTONE) 25 MG tablet Take 1 tablet po q daily Orally as directed for 90 days     SYRINGE-NEEDLE, DISP, 3 ML (BD SAFETYGLIDE SYRINGE/NEEDLE) 25G X 1" 3 ML MISC Attach needle to syringe and use to draw up and administer Toradol. Do not reuse. 4 each 5   topiramate (TOPAMAX) 50 MG tablet 25 mg at bedtime.     enoxaparin (LOVENOX) 40 MG/0.4ML injection Inject 0.4 mLs (40 mg total) into the skin once for 1 dose. On 04/11/20 0.4 mL 0   No current facility-administered medications for this visit.    Allergies as of 11/30/2021   (No Known Allergies)    Vitals: BP 107/74   Pulse 72   Ht '5\' 8"'$  (1.727 m)   Wt 151 lb 3.2 oz (68.6 kg)   BMI 22.99 kg/m  Last Weight:  Wt Readings from Last 1 Encounters:  11/30/21 151 lb 3.2 oz (68.6 kg)   Last Height:   Ht Readings from Last 1 Encounters:  11/30/21 '5\' 8"'$  (1.727 m)     Physical exam: Exam: Gen: NAD, conversant, well nourised, well groomed                     CV: RRR, no MRG. No Carotid Bruits. No peripheral edema, warm, nontender Eyes: Conjunctivae clear without exudates or hemorrhage  Neuro: Detailed Neurologic Exam  Speech:    Speech is normal; fluent and spontaneous with normal comprehension.  Cognition:    The patient  is oriented to person, place, and time;     recent and remote memory intact;     language fluent;     normal attention, concentration,     fund of knowledge Cranial Nerves:    The pupils are equal, round, and reactive to light. The fundi are normal and spontaneous venous pulsations are present. Visual fields are full to finger confrontation. Extraocular movements are intact. Trigeminal sensation is intact and the muscles of mastication are normal. The face is symmetric. The palate elevates in the midline. Hearing intact. Voice is normal. Shoulder shrug is normal. The tongue has normal motion without fasciculations.   Coordination:  Normal finger to nose and heel to shin. Normal rapid alternating movements.   Gait:    Heel-toe and tandem gait are normal.   Motor Observation:    No asymmetry, no atrophy, and no involuntary movements noted. Tone:    Normal muscle tone.    Posture:    Posture is normal. normal erect    Strength:    Strength is V/V in the upper and lower limbs.      Sensation: intact to LT     Reflex Exam:  DTR's:    Deep tendon reflexes in the upper and lower extremities are normal bilaterally.   Toes:    The toes are downgoing bilaterally.   Clonus:    Clonus is absent.    Assessment/Plan:  Patient with chronic migraines.  We discussed migraine management in detail, options, lifestyle choices, acute and preventative management.  She has failed multiple medications.  We discussed MRI of the brain but we will defer at this time, no red flags such as positional or exertional quality or vision changes, low threshold in the future.  Start Ajovy as prevention, botox would be a next good option Rizatriptan acutely: Please take one tablet at the onset of your headache. If it does not improve the symptoms please take one additional tablet. Do not take more then 2 tablets in 24hrs. Do not take use more then 2 to 3 times in a week. Can take it with Ubrelvy/Nurtec and  Toradol Toradol injections for severe refractory migraines Continue Ubrelvy as needed Try Nurtec samples: daily as needed, we can prescribe if she likes it better than ubrelvy   Tried: Gabapentin, Topiramate(hair loss, numbness and tingling and cannot perform surgery), tylenol, flexeril, decadron, lexapro, propranolol(asthmatic,contraindicated), amitriptyline/nortriptyline(sedation and weight gain), robaxin, nsaids, OTC analgesics, verapamil and other BP meds like carvedilol (contraindicated due to low blood pressure), imitrex, ubrelvy(helps), Aimovig contraindicated due to constipation, depakote.   Meds ordered this encounter  Medications   rizatriptan (MAXALT-MLT) 10 MG disintegrating tablet    Sig: Take 1 tablet (10 mg total) by mouth as needed for migraine. May repeat in 2 hours if needed    Dispense:  9 tablet    Refill:  11   Rimegepant Sulfate (NURTEC) 75 MG TBDP    Sig: Take 75 mg by mouth daily as needed. For migraines. Take as close to onset of migraine as possible. One daily maximum.    Dispense:  10 tablet    Refill:  0   Fremanezumab-vfrm (AJOVY) 225 MG/1.5ML SOAJ    Sig: Inject 225 mg into the skin every 30 (thirty) days.    Dispense:  1.5 mL    Refill:  11   ketorolac (TORADOL) 60 MG/2ML SOLN injection    Sig: Inject 1-20m (30-'60mg'$ ) intramuscularly at onset of migraine. May repeat in 6 hours. Max twice a day and 4 days per month.    Dispense:  10 mL    Refill:  4   SYRINGE-NEEDLE, DISP, 3 ML (BD SAFETYGLIDE SYRINGE/NEEDLE) 25G X 1" 3 ML MISC    Sig: Attach needle to syringe and use to draw up and administer Toradol. Do not reuse.    Dispense:  4 each    Refill:  5    Cc: RShon Baton MD,  RShon Baton MD  ASarina Ill MD  [facility] [address] [address] Phone [phone] [phone] I spent 60 minutes of face-to-face and non-face-to-face time with patient on the  1. Chronic migraine without aura  without status migrainosus, not intractable    diagnosis.  This included previsit chart review, lab review, study review, order entry, electronic health record documentation, patient education on the different diagnostic and therapeutic options, counseling and coordination of care, risks and benefits of management, compliance, or risk factor reduction

## 2021-12-01 ENCOUNTER — Encounter: Payer: Self-pay | Admitting: Neurology

## 2021-12-01 ENCOUNTER — Telehealth: Payer: Self-pay

## 2021-12-01 MED ORDER — "BD SAFETYGLIDE SYRINGE/NEEDLE 25G X 1"" 3 ML MISC": 5 refills | Status: DC

## 2021-12-01 MED ORDER — KETOROLAC TROMETHAMINE 60 MG/2ML IM SOLN: INTRAMUSCULAR | 4 refills | Status: DC

## 2021-12-01 NOTE — Telephone Encounter (Signed)
PA for ajovy has been sent via [employer] portal system.   PA cannot be completed via phone or by [facility].  I have sent 2-4 day turn around. Ref# [id number]

## 2021-12-01 NOTE — Telephone Encounter (Addendum)
Attempted PA for ajovy through [facility]. Received this message [employer] Call-In Form To initiate the prior authorization process, please use this form to document the information needed and call [employer] at [phone]. [phone]  Will call number and update once completed.

## 2021-12-14 NOTE — Telephone Encounter (Signed)
Received Ajovy approval notice from [employer]. Chelsea Flores has been approved from 12/10/21 - 12/12/22. Approval faxed to pharmacy. Received a receipt of confirmation.

## 2022-01-25 ENCOUNTER — Ambulatory Visit (INDEPENDENT_AMBULATORY_CARE_PROVIDER_SITE_OTHER): Payer: BC Managed Care – PPO

## 2022-01-25 ENCOUNTER — Ambulatory Visit (INDEPENDENT_AMBULATORY_CARE_PROVIDER_SITE_OTHER): Payer: BC Managed Care – PPO | Admitting: Orthopaedic Surgery

## 2022-01-25 DIAGNOSIS — G8929 Other chronic pain: Secondary | ICD-10-CM | POA: Diagnosis not present

## 2022-01-25 DIAGNOSIS — M5442 Lumbago with sciatica, left side: Secondary | ICD-10-CM

## 2022-01-25 DIAGNOSIS — M5441 Lumbago with sciatica, right side: Secondary | ICD-10-CM

## 2022-01-25 MED ORDER — METHOCARBAMOL 750 MG PO TABS: 750.0000 mg | ORAL_TABLET | Freq: Three times a day (TID) | ORAL | 3 refills | Status: DC | PRN

## 2022-01-25 MED ORDER — PREDNISONE 50 MG PO TABS: ORAL_TABLET | ORAL | 3 refills | Status: DC

## 2022-01-25 MED ORDER — HYDROCODONE-ACETAMINOPHEN 5-325 MG PO TABS: 1.0000 | ORAL_TABLET | Freq: Four times a day (QID) | ORAL | 0 refills | Status: DC | PRN

## 2022-01-25 NOTE — Progress Notes (Signed)
Lilie comes in today with right-sided radicular symptoms with low back pain.  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  It is recently flared up on her.  She works as a Psychologist, clinical.  Its been affecting her work and she is having radicular symptoms.  She has to limit her anti-inflammatories due to previous gastric ulcers.  She denies any change in bowel or bladder function and denies any right lower extremity weakness.  She does have a positive straight leg raise on the right side.  She is uncomfortable when she goes from a sitting to standing position and uncomfortable with flexion extension of her lumbar spine.  She does have numbness on the lateral aspect of her right leg.  She does have 5 out of 5 strength of her bilateral extremities.  An AP and lateral lumbar spine shows normal alignment and no acute findings.  We are going to try 5 days of prednisone 50 mg as well as methocarbamol 750 mg and occasional hydrocodone.  If this does not help, I would send her to Dr. Ernestina Patches for a left-sided L4-L5 ESI.  She will let us know.  All question concerns were answered and addressed.

## 2022-02-15 ENCOUNTER — Other Ambulatory Visit: Payer: Self-pay | Admitting: Obstetrics and Gynecology

## 2022-02-15 DIAGNOSIS — Z1231 Encounter for screening mammogram for malignant neoplasm of breast: Secondary | ICD-10-CM

## 2022-03-22 DIAGNOSIS — R7989 Other specified abnormal findings of blood chemistry: Secondary | ICD-10-CM | POA: Diagnosis not present

## 2022-03-29 DIAGNOSIS — Z1331 Encounter for screening for depression: Secondary | ICD-10-CM | POA: Diagnosis not present

## 2022-03-29 DIAGNOSIS — Z0000 Encounter for general adult medical examination without abnormal findings: Secondary | ICD-10-CM | POA: Diagnosis not present

## 2022-03-29 DIAGNOSIS — Z1389 Encounter for screening for other disorder: Secondary | ICD-10-CM | POA: Diagnosis not present

## 2022-03-29 DIAGNOSIS — Z23 Encounter for immunization: Secondary | ICD-10-CM | POA: Diagnosis not present

## 2022-03-29 DIAGNOSIS — G43909 Migraine, unspecified, not intractable, without status migrainosus: Secondary | ICD-10-CM | POA: Diagnosis not present

## 2022-04-05 ENCOUNTER — Telehealth (INDEPENDENT_AMBULATORY_CARE_PROVIDER_SITE_OTHER): Payer: BC Managed Care – PPO | Admitting: Neurology

## 2022-04-05 ENCOUNTER — Encounter: Payer: Self-pay | Admitting: Neurology

## 2022-04-05 ENCOUNTER — Telehealth: Payer: Self-pay | Admitting: Neurology

## 2022-04-05 ENCOUNTER — Ambulatory Visit
Admission: RE | Admit: 2022-04-05 | Discharge: 2022-04-05 | Disposition: A | Discharge status: Home / Self Care | Payer: BC Managed Care – PPO | Source: Ambulatory Visit

## 2022-04-05 DIAGNOSIS — Z1231 Encounter for screening mammogram for malignant neoplasm of breast: Secondary | ICD-10-CM

## 2022-04-05 DIAGNOSIS — G43709 Chronic migraine without aura, not intractable, without status migrainosus: Secondary | ICD-10-CM | POA: Diagnosis not present

## 2022-04-05 MED ORDER — AJOVY 225 MG/1.5ML ~~LOC~~ SOAJ: 225.0000 mg | SUBCUTANEOUS | 11 refills | Status: DC

## 2022-04-05 MED ORDER — RIZATRIPTAN BENZOATE 10 MG PO TBDP: 10.0000 mg | ORAL_TABLET | ORAL | 11 refills | Status: DC | PRN

## 2022-04-05 NOTE — Telephone Encounter (Signed)
Please call ans schedule her for follow up woith with dr Jaynee Eagles video one year

## 2022-04-05 NOTE — Telephone Encounter (Signed)
Scheduled VV with Dr. Jaynee Eagles for 04/04/23 at 1:30 pm.

## 2022-04-05 NOTE — Progress Notes (Signed)
[facility]    Provider:  Dr Jaynee Eagles Requesting Provider: Shon Baton, MD Primary Care Provider:  Shon Baton, MD  CC:  migraines  Virtual Visit via Video Note  I connected with [name] on 04/05/22 at  1:00 PM EST by a video enabled telemedicine application and verified that I am speaking with the correct person using two identifiers.  Location: Patient: home Provider: office   I discussed the limitations of evaluation and management by telemedicine and the availability of in person appointments. The patient expressed understanding and agreed to proceed.     Follow Up Instructions:    I discussed the assessment and treatment plan with the patient. The patient was provided an opportunity to ask questions and all were answered. The patient agreed with the plan and demonstrated an understanding of the instructions.   The patient was advised to call back or seek an in-person evaluation if the symptoms worsen or if the condition fails to improve as anticipated.  I provided over 12 minutes of non-face-to-face time during this encounter.   Melvenia Beam, MD   04/05/2022: Things are getting better. The Arie Sabina is working well. She will make sure to let me know if she likes maxalt, ubrelvy or nurtec and we will order for her.  She was having On average 15 mod to severe migraine days a month, now <= 4 a month and no headaches.   Patient complains of symptoms per HPI as well as the following symptoms: improved migraines . Pertinent negatives and positives per HPI. All others negative   HPI:  Chelsea Flores is a 51 y.o. female here as requested by Shon Baton, MD for migraines. Started in college with vomiting, nausea, visual loss right lateral and dot on the left and has visual auras. She is well aware of stroke risk with migraine with aura. A big trigger is hormones. Caffeine is also a trigger. She is peri-menopausal, dehydration has been a  trigger, taking too much advil has caused rebound and ulcers and now she does not take them, no medication overuse, in 2018 she would get 5-7 migraine in a week or two (not cluster headaches). She can get a dot in the center and slowly gets bigger and bigger when she has a visual aura. Her migraines are unilateral, pulsating/pounding/throbbing, movement makes it worse, unilateral, moderate to severe, can last upwards of 24-72 hours or longer untreated. Photophobia,phonophobia, daily headaches, she has a lot of neck tightness, she has pain where the muscles pop up when clenching. On average 15 mod to severe migraine days a month > 5 years. Every first degree relative has migraines. No changes in quality, not positional or exertional, no morning headaches, no vision changes. No other focal neurologic deficits, associated symptoms, inciting events or modifiable factors.  Reviewed notes, labs and imaging from outside physicians, which showed:  Tried: Gabapentin, Topiramate(hair loss, numbness and tingling and cannot perform surgery), tylenol, flexeril, decadron, lexapro, propranolol(asthmatic,contraindicated), amitriptyline/nortriptyline(sedation and weight gain), robaxin, nsaids, OTC analgesics, verapamil and other BP meds like carvedilol (contraindicated due to low blood pressure), imitrex, ubrelvy(helps), Aimovig contraindicated due to constipation, depakote.   Review of Systems: Patient complains of symptoms per HPI as well as the following symptoms migraines. Pertinent negatives and positives per HPI. All others negative.   Social History   Socioeconomic History   Marital status: Married    Spouse name: Not on file   Number of children: Not on file   Years of education: Not  on file   Highest education level: Not on file  Occupational History   Not on file  Tobacco Use   Smoking status: Never   Smokeless tobacco: Never  Vaping Use   Vaping Use: Never used  Substance and Sexual Activity    Alcohol use: Yes    Alcohol/week: 2.0 standard drinks of alcohol    Types: 2 Glasses of wine per week    Comment: occas.   Drug use: Never   Sexual activity: Not on file  Other Topics Concern   Not on file  Social History Narrative   Not on file   Social Determinants of Health   Financial Resource Strain: Not on file  Food Insecurity: Not on file  Transportation Needs: Not on file  Physical Activity: Not on file  Stress: Not on file  Social Connections: Not on file  Intimate Partner Violence: Not on file    Family History  Problem Relation Age of Onset   Migraines Mother    Migraines Father    Migraines Brother    Migraines Son    Migraines Daughter     Past Medical History:  Diagnosis Date   Anxiety    Asthma    Complication of anesthesia    Depression    Dyspnea    asthma flaring   Dysrhythmia    PONV (postoperative nausea and vomiting)     Patient Active Problem List   Diagnosis Date Noted   Chronic migraine without aura without status migrainosus, not intractable 11/30/2021   Left foot pain 01/09/2016   Hip pain 04/13/2012    Past Surgical History:  Procedure Laterality Date   CESAREAN SECTION     cist     removed from thumb   TUBAL LIGATION      Current Outpatient Medications  Medication Sig Dispense Refill   enoxaparin (LOVENOX) 40 MG/0.4ML injection Inject 0.4 mLs (40 mg total) into the skin once for 1 dose. On 04/11/20 0.4 mL 0   eszopiclone (LUNESTA) 2 MG TABS tablet Lunesta 2 mg tablet  Take 1 tablet as needed by oral route at bedtime.     Fremanezumab-vfrm (AJOVY) 225 MG/1.5ML SOAJ Inject 225 mg into the skin every 30 (thirty) days. 1.5 mL 11   HYDROcodone-acetaminophen (NORCO/VICODIN) 5-325 MG tablet Take 1 tablet by mouth every 6 (six) hours as needed for moderate pain. 30 tablet 0   ketorolac (TORADOL) 60 MG/2ML SOLN injection Inject 1-63m (30-'60mg'$ ) intramuscularly at onset of migraine. May repeat in 6 hours. Max twice a day and 4 days per  month. 10 mL 4   methocarbamol (ROBAXIN) 750 MG tablet Take 1 tablet (750 mg total) by mouth every 8 (eight) hours as needed for muscle spasms. 30 tablet 3   Pediatric Multiple Vitamins (FLINTSTONES MULTIVITAMIN PO) Take 1 tablet by mouth daily.     predniSONE (DELTASONE) 50 MG tablet Take one tablet daily for 5 days. 5 tablet 3   Rimegepant Sulfate (NURTEC) 75 MG TBDP Take 75 mg by mouth daily as needed. For migraines. Take as close to onset of migraine as possible. One daily maximum. 10 tablet 0   rizatriptan (MAXALT-MLT) 10 MG disintegrating tablet Take 1 tablet (10 mg total) by mouth as needed for migraine. May repeat in 2 hours if needed 9 tablet 11   spironolactone (ALDACTONE) 25 MG tablet Take 1 tablet po q daily Orally as directed for 90 days     SYRINGE-NEEDLE, DISP, 3 ML (BD SAFETYGLIDE SYRINGE/NEEDLE) 25G X 1"  3 ML MISC Attach needle to syringe and use to draw up and administer Toradol. Do not reuse. 4 each 5   No current facility-administered medications for this visit.    Allergies as of 04/05/2022   (No Known Allergies)    Vitals: There were no vitals taken for this visit. Last Weight:  Wt Readings from Last 1 Encounters:  11/30/21 151 lb 3.2 oz (68.6 kg)   Last Height:   Ht Readings from Last 1 Encounters:  11/30/21 '5\' 8"'$  (1.727 m)    Physical exam: Exam: Gen: NAD, conversant      CV: Could not perform over Web Video. Denies palpitations or chest pain or SOB. VS: Breathing at a normal rate. Weight appears within normal limits. Not febrile. Eyes: Conjunctivae clear without exudates or hemorrhage  Neuro: Detailed Neurologic Exam  Speech:    Speech is normal; fluent and spontaneous with normal comprehension.  Cognition:    The patient is oriented to person, place, and time;     recent and remote memory intact;     language fluent;     normal attention, concentration,     fund of knowledge Cranial Nerves:    The pupils are equal, round, and reactive to light.  Cannot perform fundoscopic exam. Visual fields are full to finger confrontation. Extraocular movements are intact.  The face is symmetric with normal sensation. The palate elevates in the midline. Hearing intact. Voice is normal. Shoulder shrug is normal. The tongue has normal motion without fasciculations.   Coordination:    Normal finger to nose  Gait:    Normal native gait  Motor Observation:   no involuntary movements noted. Tone:    Appears normal  Posture:    Posture is normal. normal erect    Strength:    Strength is anti-gravity and symmetric in the upper and lower limbs.      Sensation: intact to LT         Assessment/Plan:  Patient with chronic migraines.  We discussed migraine management in detail, options, lifestyle choices, acute and preventative management.  She has failed multiple medications.  We discussed MRI of the brain but we will defer at this time, no red flags such as positional or exertional quality or vision changes, low threshold in the future.  Things are getting better. The Arie Sabina is working well. She will make sure to let me know if she likes maxalt, ubrelvy or nurtec and we will order for her.  She was having On average 15 mod to severe migraine days a month, now <= 4 a month and no headaches.   Start Ajovy as prevention, botox would be a next good option Rizatriptan acutely: Please take one tablet at the onset of your headache. If it does not improve the symptoms please take one additional tablet. Do not take more then 2 tablets in 24hrs. Do not take use more then 2 to 3 times in a week. Can take it with Ubrelvy/Nurtec and Toradol Toradol injections for severe refractory migraines Continue Ubrelvy as needed Try Nurtec samples: daily as needed, we can prescribe if she likes it better than ubrelvy   Tried: Gabapentin, Topiramate(hair loss, numbness and tingling and cannot perform surgery), tylenol, flexeril, decadron, lexapro,  propranolol(asthmatic,contraindicated), amitriptyline/nortriptyline(sedation and weight gain), robaxin, nsaids, OTC analgesics, verapamil and other BP meds like carvedilol (contraindicated due to low blood pressure), imitrex, ubrelvy(helps), Aimovig contraindicated due to constipation, depakote, ajovy, nurtec, ubrelvy  Meds ordered this encounter  Medications   Fremanezumab-vfrm (  AJOVY) 225 MG/1.5ML SOAJ    Sig: Inject 225 mg into the skin every 30 (thirty) days.    Dispense:  1.5 mL    Refill:  11   rizatriptan (MAXALT-MLT) 10 MG disintegrating tablet    Sig: Take 1 tablet (10 mg total) by mouth as needed for migraine. May repeat in 2 hours if needed    Dispense:  9 tablet    Refill:  11    Cc: Shon Baton, MD,  Shon Baton, MD  Sarina Ill, MD  [facility] [address] [place], [place] [address]  Phone [phone] Fax [phone]

## 2022-04-05 NOTE — Telephone Encounter (Signed)
Spoke with [facility] pharmacy and canceled prescriptions for Ajovy and Rizatriptan.

## 2022-04-13 ENCOUNTER — Ambulatory Visit: Payer: BC Managed Care – PPO | Admitting: Sports Medicine

## 2022-04-13 VITALS — BP 118/64 | Ht 68.0 in | Wt 145.0 lb

## 2022-04-13 DIAGNOSIS — M7660 Achilles tendinitis, unspecified leg: Secondary | ICD-10-CM

## 2022-04-13 NOTE — Progress Notes (Unsigned)
PCP: Shon Baton, MD  Subjective:   HPI: Patient is a 51 y.o. female here for further evaluation of her left achilles tendon pain for the last 16 months.  Patient reports that the pain started while she was running.  She is an avid runner and has run many marathons.  She notes that she experienced similar pain when she was younger and marathon training, however she never had her Achilles evaluated at that time.  Her Achilles pain had resolved with conservative care at home.  This time, however, her pain has persisted.  It is not necessarily worsening, however it has not improved. She has taken time off from running but notes that when she does try to run again she can run about 2-3 miles but the following day is when she experiences significant pain.  She denies any injuries or trauma to the area.  She has noticed a mild bulge to her left Achilles.  She notes that she cannot take NSAIDs because her history of ulcers.  She has Voltaren gel at home but has not tried it.  She also has heel lifts at home but has not tried them. For cardio she enjoys hiking and walking in addition to running. She hopes to run another half marathon when recovered.  Past Medical History:  Diagnosis Date   Anxiety    Asthma    Complication of anesthesia    Depression    Dyspnea    asthma flaring   Dysrhythmia    PONV (postoperative nausea and vomiting)     Current Outpatient Medications on File Prior to Visit  Medication Sig Dispense Refill   enoxaparin (LOVENOX) 40 MG/0.4ML injection Inject 0.4 mLs (40 mg total) into the skin once for 1 dose. On 04/11/20 0.4 mL 0   eszopiclone (LUNESTA) 2 MG TABS tablet Lunesta 2 mg tablet  Take 1 tablet as needed by oral route at bedtime.     Fremanezumab-vfrm (AJOVY) 225 MG/1.5ML SOAJ Inject 225 mg into the skin every 30 (thirty) days. 1.5 mL 11   HYDROcodone-acetaminophen (NORCO/VICODIN) 5-325 MG tablet Take 1 tablet by mouth every 6 (six) hours as needed for moderate pain. 30  tablet 0   ketorolac (TORADOL) 60 MG/2ML SOLN injection Inject 1-69m (30-'60mg'$ ) intramuscularly at onset of migraine. May repeat in 6 hours. Max twice a day and 4 days per month. 10 mL 4   methocarbamol (ROBAXIN) 750 MG tablet Take 1 tablet (750 mg total) by mouth every 8 (eight) hours as needed for muscle spasms. 30 tablet 3   Pediatric Multiple Vitamins (FLINTSTONES MULTIVITAMIN PO) Take 1 tablet by mouth daily.     predniSONE (DELTASONE) 50 MG tablet Take one tablet daily for 5 days. 5 tablet 3   Rimegepant Sulfate (NURTEC) 75 MG TBDP Take 75 mg by mouth daily as needed. For migraines. Take as close to onset of migraine as possible. One daily maximum. 10 tablet 0   rizatriptan (MAXALT-MLT) 10 MG disintegrating tablet Take 1 tablet (10 mg total) by mouth as needed for migraine. May repeat in 2 hours if needed 9 tablet 11   spironolactone (ALDACTONE) 25 MG tablet Take 1 tablet po q daily Orally as directed for 90 days     SYRINGE-NEEDLE, DISP, 3 ML (BD SAFETYGLIDE SYRINGE/NEEDLE) 25G X 1" 3 ML MISC Attach needle to syringe and use to draw up and administer Toradol. Do not reuse. 4 each 5   No current facility-administered medications on file prior to visit.    Past  Surgical History:  Procedure Laterality Date   CESAREAN SECTION     cist     removed from thumb   TUBAL LIGATION      No Known Allergies  BP 118/64   Ht '5\' 8"'$  (1.727 m)   Wt 145 lb (65.8 kg)   BMI 22.05 kg/m       No data to display              No data to display             Objective:  Physical Exam:  Gen: NAD, comfortable in exam room Resp: Normal effort, breathing comfortably on room air   Left Foot/Ankle Musculoskeletal Exam Gait   Gait is normal.  Inspection   Left     Erythema: none       Effusion: none       Edema: none       Ecchymosis: none       Deformity: none    Palpation   Left      Left foot/ankle palpation is unremarkable.    Range of Motion   Left     Left foot/ankle  range of motion is normal and full.    Strength   Left     Left foot/ankle strength is normal.   Neurovascular   Left     Left foot/ankle neurovascular exam is normal.    Special Tests   Left     Anterior drawer test: negative     Talar tilt stress test: negative       Thompson's test: negative    Assessment & Plan:  1. Achilles Tendinopathy  Limited U/S performed in office today demonstrates thickened left achilles tendon with some fluid surrounding. In short axis view there was hypoechoic irregularity. Findings and chronicity suggestive of tendinopathy.  -Alfredson heel lift exercises daily -5/16 inch heel lifts -Avoid walking barefoot and avoid running for now -No NSAIDs given hx of ulcers. Cannot tolerate Nitro patches 2/2 migraines -F/u in 4 weeks   Patient seen and evaluated with the resident.  I agree with the above plan of care.  Patient does have some mild swelling at the mid substance of the Achilles tendon and an ultrasound today shows changes consistent with tendinopathy here although those changes are not severe.  Will proceed with treatment including heel lifts and home exercises.  I briefly discussed topical nitroglycerin but she is unable to tolerate this secondary to migraines.  She will continue nonimpact aerobic exercise and avoid running until follow-up with me again in 4 weeks.  She may continue with recreational walking if it is tolerable.  Hopefully at follow-up we will be able to reintroduce running with a slow gradual increase in volume.  This note was dictated using Dragon naturally speaking software and may contain errors in syntax, spelling, or content which have not been identified prior to signing this note.

## 2022-04-27 DIAGNOSIS — Z6823 Body mass index (BMI) 23.0-23.9, adult: Secondary | ICD-10-CM | POA: Diagnosis not present

## 2022-04-27 DIAGNOSIS — Z01419 Encounter for gynecological examination (general) (routine) without abnormal findings: Secondary | ICD-10-CM | POA: Diagnosis not present

## 2022-05-17 DIAGNOSIS — M25532 Pain in left wrist: Secondary | ICD-10-CM | POA: Diagnosis not present

## 2022-05-18 ENCOUNTER — Ambulatory Visit: Payer: BC Managed Care – PPO | Admitting: Sports Medicine

## 2022-05-31 ENCOUNTER — Encounter: Payer: Self-pay | Admitting: Neurology

## 2022-06-09 ENCOUNTER — Ambulatory Visit: Payer: BC Managed Care – PPO | Admitting: Neurology

## 2022-06-10 DIAGNOSIS — M25532 Pain in left wrist: Secondary | ICD-10-CM | POA: Diagnosis not present

## 2022-06-16 ENCOUNTER — Encounter: Payer: Self-pay | Admitting: Neurology

## 2022-06-16 ENCOUNTER — Ambulatory Visit: Payer: BC Managed Care – PPO | Admitting: Neurology

## 2022-06-16 ENCOUNTER — Telehealth: Payer: Self-pay | Admitting: Neurology

## 2022-06-16 VITALS — BP 106/76 | HR 80 | Ht 67.0 in | Wt 150.0 lb

## 2022-06-16 DIAGNOSIS — G43709 Chronic migraine without aura, not intractable, without status migrainosus: Secondary | ICD-10-CM

## 2022-06-16 MED ORDER — UBRELVY 100 MG PO TABS: 100.0000 mg | ORAL_TABLET | ORAL | 11 refills | Status: DC | PRN

## 2022-06-16 NOTE — Telephone Encounter (Signed)
Can we start the process for approval for Botox for chronic migraine headaches?

## 2022-06-16 NOTE — Telephone Encounter (Signed)
Please submit PA for botox 200 units G43.709

## 2022-06-16 NOTE — Progress Notes (Signed)
Patient: Chelsea Flores Date of Birth: [birth year]  Reason for Visit: Follow up History from: Patient Primary Neurologist: Jaynee Eagles   ASSESSMENT AND PLAN 52 y.o. year old female   1.  Chronic migraine headaches -Will proceed with Botox authorization for chronic migraine headaches -For now we will stay on Ajovy, we discussed stopping, she just did her recent injection 2/2, really doesn't want to be on a lot of medications, may consider Emgality+ Botox -I will send in Ubrelvy 100 mg, she has a 50 mg tablet but always has to take 2, also has Maxalt if needed, Nurtec was not helpful -I will see her soon for Botox injection (see below for tried and failed)  HISTORY OF PRESENT ILLNESS: Today 06/16/22 Here today today to discuss Botox. Remains on Ajovy. Several breakthrough headaches, with nausea, tried Nurtec, didn't help. Another time took [place] and Maxalt was able to stay at work. 1 spell of right trigeminal pain, sent her to the floor in [employer]. On Ajovy since July 2023. Doing her injection to abdomen, has had redness x 3 months. Just did Ajovy 06/11/22. This past month 12-15 headache days. Her migraines are episodic. Is perimenopausal. Her migraines are episodic. Has not taken Toradol injection due to gastric ulcer. Planning to travel to [place] for the next 2 weeks.   HISTORY 04/05/2022: Things are getting better. The Arie Sabina is working well. She will make sure to let me know if she likes maxalt, ubrelvy or nurtec and we will order for her.  She was having On average 15 mod to severe migraine days a month, now <= 4 a month and no headaches.    Patient complains of symptoms per HPI as well as the following symptoms: improved migraines . Pertinent negatives and positives per HPI. All others negative     HPI:  Chelsea Flores is a 52 y.o. female here as requested by Shon Baton, MD for migraines. Started in college with vomiting, nausea, visual loss right lateral and dot on the left and  has visual auras. She is well aware of stroke risk with migraine with aura. A big trigger is hormones. Caffeine is also a trigger. She is peri-menopausal, dehydration has been a trigger, taking too much advil has caused rebound and ulcers and now she does not take them, no medication overuse, in 2018 she would get 5-7 migraine in a week or two (not cluster headaches). She can get a dot in the center and slowly gets bigger and bigger when she has a visual aura. Her migraines are unilateral, pulsating/pounding/throbbing, movement makes it worse, unilateral, moderate to severe, can last upwards of 24-72 hours or longer untreated. Photophobia,phonophobia, daily headaches, she has a lot of neck tightness, she has pain where the muscles pop up when clenching. On average 15 mod to severe migraine days a month > 5 years. Every first degree relative has migraines. No changes in quality, not positional or exertional, no morning headaches, no vision changes. No other focal neurologic deficits, associated symptoms, inciting events or modifiable factors.   Reviewed notes, labs and imaging from outside physicians, which showed:   Tried: Gabapentin, Topiramate(hair loss, numbness and tingling and cannot perform surgery), tylenol, flexeril, decadron, lexapro, propranolol(asthmatic,contraindicated), amitriptyline/nortriptyline(sedation and weight gain), robaxin, nsaids, OTC analgesics, verapamil and other BP meds like carvedilol (contraindicated due to low blood pressure), imitrex, ubrelvy(helps), Aimovig contraindicated due to constipation, depakote.       REVIEW OF SYSTEMS: Out of a complete 14 system review of symptoms, the patient  complains only of the following symptoms, and all other reviewed systems are negative.  See HPI  ALLERGIES: Allergies  Allergen Reactions   Nsaids Other (See Comments)    HOME MEDICATIONS: Outpatient Medications Prior to Visit  Medication Sig Dispense Refill   eszopiclone (LUNESTA)  2 MG TABS tablet Lunesta 2 mg tablet  Take 1 tablet as needed by oral route at bedtime.     Fremanezumab-vfrm (AJOVY) 225 MG/1.5ML SOAJ Inject 225 mg into the skin every 30 (thirty) days. 1.5 mL 11   HYDROcodone-acetaminophen (NORCO/VICODIN) 5-325 MG tablet Take 1 tablet by mouth every 6 (six) hours as needed for moderate pain. 30 tablet 0   ketorolac (TORADOL) 60 MG/2ML SOLN injection Inject 1-15m (30-'60mg'$ ) intramuscularly at onset of migraine. May repeat in 6 hours. Max twice a day and 4 days per month. 10 mL 4   methocarbamol (ROBAXIN) 750 MG tablet Take 1 tablet (750 mg total) by mouth every 8 (eight) hours as needed for muscle spasms. 30 tablet 3   Pediatric Multiple Vitamins (FLINTSTONES MULTIVITAMIN PO) Take 1 tablet by mouth daily.     predniSONE (DELTASONE) 50 MG tablet Take one tablet daily for 5 days. 5 tablet 3   rizatriptan (MAXALT-MLT) 10 MG disintegrating tablet Take 1 tablet (10 mg total) by mouth as needed for migraine. May repeat in 2 hours if needed 9 tablet 11   spironolactone (ALDACTONE) 25 MG tablet Take 1 tablet po q daily Orally as directed for 90 days     SYRINGE-NEEDLE, DISP, 3 ML (BD SAFETYGLIDE SYRINGE/NEEDLE) 25G X 1" 3 ML MISC Attach needle to syringe and use to draw up and administer Toradol. Do not reuse. 4 each 5   Rimegepant Sulfate (NURTEC) 75 MG TBDP Take 75 mg by mouth daily as needed. For migraines. Take as close to onset of migraine as possible. One daily maximum. 10 tablet 0   enoxaparin (LOVENOX) 40 MG/0.4ML injection Inject 0.4 mLs (40 mg total) into the skin once for 1 dose. On 04/11/20 0.4 mL 0   No facility-administered medications prior to visit.    PAST MEDICAL HISTORY: Past Medical History:  Diagnosis Date   Anxiety    Asthma    Complication of anesthesia    Depression    Dyspnea    asthma flaring   Dysrhythmia    PONV (postoperative nausea and vomiting)     PAST SURGICAL HISTORY: Past Surgical History:  Procedure Laterality Date    CESAREAN SECTION     cist     removed from thumb   TUBAL LIGATION      FAMILY HISTORY: Family History  Problem Relation Age of Onset   Migraines Mother    Migraines Father    Migraines Brother    Migraines Son    Migraines Daughter     SOCIAL HISTORY: Social History   Socioeconomic History   Marital status: Married    Spouse name: Not on file   Number of children: Not on file   Years of education: Not on file   Highest education level: Not on file  Occupational History   Not on file  Tobacco Use   Smoking status: Never   Smokeless tobacco: Never  Vaping Use   Vaping Use: Never used  Substance and Sexual Activity   Alcohol use: Yes    Alcohol/week: 2.0 standard drinks of alcohol    Types: 2 Glasses of wine per week    Comment: occas.   Drug use: Never   Sexual  activity: Not on file  Other Topics Concern   Not on file  Social History Narrative   Not on file   Social Determinants of Health   Financial Resource Strain: Not on file  Food Insecurity: Not on file  Transportation Needs: Not on file  Physical Activity: Not on file  Stress: Not on file  Social Connections: Not on file  Intimate Partner Violence: Not on file    PHYSICAL EXAM  Vitals:   06/16/22 0744  BP: 106/76  Pulse: 80  Weight: 150 lb (68 kg)  Height: '5\' 7"'$  (1.702 m)   Body mass index is 23.49 kg/m.  Generalized: Well developed, in no acute distress  Neurological examination  Mentation: Alert oriented to time, place, history taking. Follows all commands speech and language fluent Cranial nerve II-XII: Pupils were equal round reactive to light. Extraocular movements were full, visual field were full on confrontational test. Facial sensation and strength were normal.  Head turning and shoulder shrug  were normal and symmetric. Motor: The motor testing reveals 5 over 5 strength of all 4 extremities. Good symmetric motor tone is noted throughout.  Sensory: Sensory testing is intact to soft  touch on all 4 extremities. No evidence of extinction is noted.  Coordination: Cerebellar testing reveals good finger-nose-finger and heel-to-shin bilaterally.  Gait and station: Gait is normal.  Reflexes: Deep tendon reflexes are symmetric and normal bilaterally.   DIAGNOSTIC DATA (LABS, IMAGING, TESTING) - I reviewed patient records, labs, notes, testing and imaging myself where available.  Lab Results  Component Value Date   WBC 4.4 04/01/2020   HGB 14.0 04/01/2020   HCT 41.4 04/01/2020   MCV 92.4 04/01/2020   PLT 222 04/01/2020   No results found for: "NA", "K", "CL", "CO2", "GLUCOSE", "BUN", "CREATININE", "CALCIUM", "PROT", "ALBUMIN", "AST", "ALT", "ALKPHOS", "BILITOT", "GFRNONAA", "GFRAA" No results found for: "CHOL", "HDL", "[place]", "LDLDIRECT", "TRIG", "CHOLHDL" No results found for: "HGBA1C" No results found for: "VITAMINB12" No results found for: "TSH"  Butler Denmark, AGNP-C, DNP 06/16/2022, 8:15 AM [facility] [address], [place], [place] [address] [phone]

## 2022-06-25 ENCOUNTER — Other Ambulatory Visit (HOSPITAL_COMMUNITY): Payer: Self-pay

## 2022-06-30 ENCOUNTER — Other Ambulatory Visit (HOSPITAL_COMMUNITY): Payer: Self-pay

## 2022-06-30 NOTE — Telephone Encounter (Signed)
Pharmacy Patient Advocate Encounter   Received notification from [facility] that prior authorization for Botox 200 units is required/requested.   PA submitted on 06/30/2022 to (ins) [employer]  via [place] Status is pending

## 2022-06-30 NOTE — Telephone Encounter (Signed)
  Benefit Verification [id number] Submitted!

## 2022-07-05 ENCOUNTER — Other Ambulatory Visit (HOSPITAL_COMMUNITY): Payer: Self-pay

## 2022-07-05 NOTE — Telephone Encounter (Signed)
Per [employer] benefits verification the pharmacy benefit will not cover Botox so this must be ran thru the medical benefits-no PA required per [employer] benefits. Will need to use [facility] specialty pharmacy.

## 2022-07-06 MED ORDER — ONABOTULINUMTOXINA 200 UNITS IJ SOLR: 200.0000 [IU] | INTRAMUSCULAR | 2 refills | Status: DC

## 2022-07-06 NOTE — Addendum Note (Signed)
Addended by: Kristen Loader on: 07/06/2022 09:32 AM   Modules accepted: Orders

## 2022-07-06 NOTE — Telephone Encounter (Signed)
Order sent.

## 2022-07-06 NOTE — Telephone Encounter (Signed)
Please send Rx to [facility] pharmacy, thank you!

## 2022-07-19 DIAGNOSIS — Z13820 Encounter for screening for osteoporosis: Secondary | ICD-10-CM | POA: Diagnosis not present

## 2022-07-21 ENCOUNTER — Encounter: Payer: Self-pay | Admitting: Neurology

## 2022-07-21 MED ORDER — ONABOTULINUMTOXINA 200 UNITS IJ SOLR: INTRAMUSCULAR | 2 refills | Status: DC

## 2022-07-21 NOTE — Addendum Note (Signed)
Addended by: Gildardo Griffes on: 07/21/2022 12:26 PM   Modules accepted: Orders

## 2022-07-27 ENCOUNTER — Other Ambulatory Visit (HOSPITAL_COMMUNITY): Payer: Self-pay

## 2022-07-27 NOTE — Telephone Encounter (Signed)
Yes [facility] will work.

## 2022-07-27 NOTE — Telephone Encounter (Signed)
[facility] is stating they no longer dispense Botox. Is there another SP we should use or should she be done as B/B?

## 2022-07-29 ENCOUNTER — Telehealth: Payer: Self-pay

## 2022-07-29 DIAGNOSIS — R002 Palpitations: Secondary | ICD-10-CM | POA: Diagnosis not present

## 2022-07-29 DIAGNOSIS — F418 Other specified anxiety disorders: Secondary | ICD-10-CM | POA: Diagnosis not present

## 2022-07-29 DIAGNOSIS — I493 Ventricular premature depolarization: Secondary | ICD-10-CM | POA: Diagnosis not present

## 2022-07-29 DIAGNOSIS — J45909 Unspecified asthma, uncomplicated: Secondary | ICD-10-CM | POA: Diagnosis not present

## 2022-07-29 NOTE — Telephone Encounter (Signed)
Pharmacy Patient Advocate Encounter  Prior Authorization for Ubrelvy 100 MG Tablets has been approved by [employer] (ins).    PA # [id number] Effective dates: 07/22/2022 through 07/23/2023

## 2022-07-30 DIAGNOSIS — D4989 Neoplasm of unspecified behavior of other specified sites: Secondary | ICD-10-CM | POA: Diagnosis not present

## 2022-07-30 DIAGNOSIS — D485 Neoplasm of uncertain behavior of skin: Secondary | ICD-10-CM | POA: Diagnosis not present

## 2022-07-30 DIAGNOSIS — L573 Poikiloderma of Civatte: Secondary | ICD-10-CM | POA: Diagnosis not present

## 2022-07-30 DIAGNOSIS — L570 Actinic keratosis: Secondary | ICD-10-CM | POA: Diagnosis not present

## 2022-07-30 DIAGNOSIS — D2261 Melanocytic nevi of right upper limb, including shoulder: Secondary | ICD-10-CM | POA: Diagnosis not present

## 2022-07-30 DIAGNOSIS — L821 Other seborrheic keratosis: Secondary | ICD-10-CM | POA: Diagnosis not present

## 2022-07-30 DIAGNOSIS — D225 Melanocytic nevi of trunk: Secondary | ICD-10-CM | POA: Diagnosis not present

## 2022-08-03 ENCOUNTER — Ambulatory Visit: Payer: BC Managed Care – PPO | Admitting: Neurology

## 2022-08-04 ENCOUNTER — Ambulatory Visit: Payer: BC Managed Care – PPO | Admitting: Neurology

## 2022-08-09 ENCOUNTER — Ambulatory Visit: Payer: BC Managed Care – PPO | Attending: Cardiology | Admitting: Cardiology

## 2022-08-10 ENCOUNTER — Encounter: Payer: Self-pay | Admitting: Neurology

## 2022-08-10 ENCOUNTER — Ambulatory Visit: Payer: BC Managed Care – PPO | Admitting: Neurology

## 2022-08-10 ENCOUNTER — Encounter: Payer: Self-pay | Admitting: Cardiology

## 2022-08-10 VITALS — BP 116/64 | Wt 150.0 lb

## 2022-08-10 DIAGNOSIS — G43709 Chronic migraine without aura, not intractable, without status migrainosus: Secondary | ICD-10-CM | POA: Diagnosis not present

## 2022-08-10 MED ORDER — ONABOTULINUMTOXINA 200 UNITS IJ SOLR
200.0000 [IU] | Freq: Once | INTRAMUSCULAR | Status: AC
  Administered 2022-08-10: 200 [IU] via INTRAMUSCULAR

## 2022-08-10 NOTE — Progress Notes (Signed)
Botox 200 units x 1 vial [id number] Exp-10/2024 B/B

## 2022-08-10 NOTE — Progress Notes (Signed)
   BOTOX PROCEDURE NOTE FOR MIGRAINE HEADACHE   HISTORY: Chelsea Flores is here for her first Botox injection.  She stopped Ajovy.  Currently has about 15 headache days a month.  2-3 migraines.  The cognitive clouding can last 2 to 3 days.  She has not filled the Ubrelvy 100 mg, has been using the 50 mg by taking 2 tablets.  It does work well for her.   Description of procedure:  The patient was placed in a sitting position. The standard protocol was used for Botox as follows, with 5 units of Botox injected at each site:   -Procerus muscle, midline injection  -Corrugator muscle, bilateral injection  -Frontalis muscle, bilateral injection, with 2 sites each side, medial injection was performed in the upper one third of the frontalis muscle, in the region vertical from the medial inferior edge of the superior orbital rim. The lateral injection was again in the upper one third of the forehead vertically above the lateral limbus of the cornea, 1.5 cm lateral to the medial injection site.  -Temporalis muscle injection, 4 sites, bilaterally. The first injection was 3 cm above the tragus of the ear, second injection site was 1.5 cm to 3 cm up from the first injection site in line with the tragus of the ear. The third injection site was 1.5-3 cm forward between the first 2 injection sites. The fourth injection site was 1.5 cm posterior to the second injection site.  -Occipitalis muscle injection, 3 sites, bilaterally. The first injection was done one half way between the occipital protuberance and the tip of the mastoid process behind the ear. The second injection site was done lateral and superior to the first, 1 fingerbreadth from the first injection. The third injection site was 1 fingerbreadth superiorly and medially from the first injection site.  -Cervical paraspinal muscle injection, 2 sites, bilateral, the first injection site was 1 cm from the midline of the cervical spine, 3 cm inferior to the lower  border of the occipital protuberance. The second injection site was 1.5 cm superiorly and laterally to the first injection site.  -Trapezius muscle injection was performed at 3 sites, bilaterally. The first injection site was in the upper trapezius muscle halfway between the inflection point of the neck, and the acromion. The second injection site was one half way between the acromion and the first injection site. The third injection was done between the first injection site and the inflection point of the neck.   A 200 unit bottle of Botox was used, 155 units were injected, the rest of the Botox was wasted. The patient tolerated the procedure well, there were no complications of the above procedure.  Botox NDC [id number] Lot number [id number] Expiration date 10/2024 BB  Her Roselyn Meier looks like it was approved through March 2025.  For the 100 mg tablet.  She should be able to get it filled.

## 2022-08-15 ENCOUNTER — Encounter: Payer: Self-pay | Admitting: Orthopaedic Surgery

## 2022-08-16 ENCOUNTER — Ambulatory Visit: Payer: BC Managed Care – PPO | Admitting: Physical Medicine and Rehabilitation

## 2022-08-16 ENCOUNTER — Encounter: Payer: Self-pay | Admitting: Physical Medicine and Rehabilitation

## 2022-08-16 DIAGNOSIS — S39012A Strain of muscle, fascia and tendon of lower back, initial encounter: Secondary | ICD-10-CM

## 2022-08-16 DIAGNOSIS — M5416 Radiculopathy, lumbar region: Secondary | ICD-10-CM | POA: Diagnosis not present

## 2022-08-16 DIAGNOSIS — M5441 Lumbago with sciatica, right side: Secondary | ICD-10-CM | POA: Diagnosis not present

## 2022-08-16 DIAGNOSIS — G8929 Other chronic pain: Secondary | ICD-10-CM

## 2022-08-16 MED ORDER — METHOCARBAMOL 500 MG PO TABS: 500.0000 mg | ORAL_TABLET | Freq: Three times a day (TID) | ORAL | 0 refills | Status: DC

## 2022-08-16 MED ORDER — HYDROCODONE-ACETAMINOPHEN 5-325 MG PO TABS: 1.0000 | ORAL_TABLET | Freq: Three times a day (TID) | ORAL | 0 refills | Status: DC | PRN

## 2022-08-16 MED ORDER — PREDNISONE 50 MG PO TABS: 50.0000 mg | ORAL_TABLET | Freq: Every day | ORAL | 0 refills | Status: DC

## 2022-08-16 NOTE — Progress Notes (Unsigned)
Chelsea Flores - 52 y.o. female MRN [id number]  Date of birth: 1970-10-24  Office Visit Note: Visit Date: 08/16/2022 PCP: Creola Corn, MD Referred by: Creola Corn, MD  Subjective: Chief Complaint  Patient presents with   Lower Back - Pain   HPI: Chelsea Flores is a 52 y.o. female who comes in today as a self referral for evaluation of acute on chronic bilateral lower back pain radiating down both legs, right greater than left. Pain ongoing for several years, worsened 1 day ago after sneezing. She reports multiple acute exacerbations of pain over the years. Pain worsens with activity and movement. She describes pain as tight, sharp and shooting sensation, currently rates as 3 out of 10. Some relief of pain with home exercise regimen, TENS unit, rest and use of medications. History of multiple sessions of physical therapy and dry needling, good relief of pain with these treatments. Patient is avid runner. Lumbar MRI imaging from 2021 exhibits right central disc protrusion/annular tear at L4-L5, small central disc protrusion at L5-S1. No high grade spinal canal stenosis noted. No history of lumbar injections/surgery. Previously treated by Dr. Doneen Poisson in September 2023, reports relief of pain with Prednisone, Robaxin and Norco. She would like to try medication management today before proceeding with any type of imaging/injections. Patent denies focal weakness, numbness and tingling. No recent trauma or falls.    Review of Systems  Musculoskeletal:  Positive for back pain.  Neurological:  Negative for tingling, sensory change, focal weakness and weakness.  All other systems reviewed and are negative.  Otherwise per HPI.  Assessment & Plan: Visit Diagnoses:    ICD-10-CM   1. Strain of lumbar region, initial encounter  S39.012A     2. Chronic bilateral low back pain with right-sided sciatica  M54.41    G89.29     3. Lumbar radiculopathy  M54.16        Plan:  Findings:  Acute on chronic bilateral lower back pain radiating down both legs, right greater than left. Patient continues to have severe pain despite good conservative therapies such as formal physical therapy, home exercise regimen, rest and use of medications. Patients clinical presentation and exam are consistent with lumbar strain. I did discuss medication management with her today in detail, she is unable to take NSAIDS due to prior history of peptic ulcer. I did prescribe short course of oral Prednisone as well as Robaxin and Norco. I do feel it would be beneficial for her to re-group with physical therapy. Developing a home exercise regimen with a focus on core strengthening would hopefully reduce risk of future exacerbation. I would like to see her back in 6 weeks for re-evaluation. If her pain persists we did discuss obtaining new lumbar MRI imaging. Depending on imaging we would consider performing lumbar epidural steroid injection. She would like to avoid surgery if possible. No red flag symptoms noted upon exam today.     Meds & Orders:  Meds ordered this encounter  Medications   predniSONE (DELTASONE) 50 MG tablet    Sig: Take 1 tablet (50 mg total) by mouth daily with breakfast. Take until completed.    Dispense:  5 tablet    Refill:  0   methocarbamol (ROBAXIN) 500 MG tablet    Sig: Take 1 tablet (500 mg total) by mouth 3 (three) times daily.    Dispense:  90 tablet    Refill:  0   HYDROcodone-acetaminophen (NORCO/VICODIN) 5-325 MG tablet  Sig: Take 1 tablet by mouth every 8 (eight) hours as needed for moderate pain.    Dispense:  20 tablet    Refill:  0   No orders of the defined types were placed in this encounter.   Follow-up: Return for 6 week follow up for re-evaluation.   Procedures: No procedures performed      Clinical History: CLINICAL DATA:  Low back pain   EXAM: MRI LUMBAR SPINE WITHOUT CONTRAST   TECHNIQUE: Multiplanar, multisequence MR imaging of the  lumbar spine was performed. No intravenous contrast was administered.   COMPARISON:  03/18/2019   FINDINGS: Segmentation:  Standard.   Alignment:  Stable.   Vertebrae: Stable vertebral body heights. No substantial marrow edema. No suspicious osseous lesion.   Conus medullaris and cauda equina: Conus extends to the L1 level. Conus and cauda equina appear normal.   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 Cholelithiasis.   Disc levels:   L1-L2:  No canal or foraminal stenosis.   L2-L3:  No canal or foraminal stenosis.   L3-L4:  No canal or foraminal stenosis.   L4-L5: Right central disc protrusion. Mild facet arthropathy. No canal or foraminal stenosis.   L5-S1: Small central disc protrusion. No canal or foraminal stenosis.   IMPRESSION: Mild lower lumbar degenerative changes similar to 2020 examination. No significant stenosis.     Electronically Signed   By: Guadlupe Spanish M.D.   On: 04/02/2020 12:55   She reports that she has never smoked. She has never used smokeless tobacco. No results for input(s): "HGBA1C", "LABURIC" in the last 8760 hours.  Objective:  VS:  HT:    WT:   BMI:     BP:   HR: bpm  TEMP: ( )  RESP:  Physical Exam Vitals reviewed.  HENT:     Head: Normocephalic and atraumatic.     Right Ear: External ear normal.     Left Ear: External ear normal.     Nose: Nose normal.     Mouth/Throat:     Mouth: Mucous membranes are moist.  Eyes:     Extraocular Movements: Extraocular movements intact.  Cardiovascular:     Rate and Rhythm: Normal rate.     Pulses: Normal pulses.  Pulmonary:     Effort: Pulmonary effort is normal.  Abdominal:     General: Abdomen is flat. There is no distension.  Musculoskeletal:        General: Tenderness present. Normal range of motion.     Cervical back: Normal range of motion.     Comments: Exam difficult due to pain. Patient rises from seated position to standing without  difficulty. Good lumbar range of motion. No pain noted with facet loading. 5/5 strength noted with bilateral hip flexion, knee flexion/extension, ankle dorsiflexion/plantarflexion and EHL. No clonus noted bilaterally. No pain upon palpation of greater trochanters. No pain with internal/external rotation of bilateral hips. Sensation intact bilaterally. Ambulates without aid, gait steady.     Skin:    General: Skin is warm and dry.     Capillary Refill: Capillary refill takes less than 2 seconds.  Neurological:     General: No focal deficit present.     Mental Status: She is alert and oriented to person, place, and time.  Psychiatric:        Mood and Affect: Mood normal.        Behavior: Behavior normal.     Ortho Exam  Imaging: No  results found.  Past Medical/Family/Surgical/Social History: Medications & Allergies reviewed per EMR, new medications updated. Patient Active Problem List   Diagnosis Date Noted   Chronic migraine without aura without status migrainosus, not intractable 11/30/2021   Left foot pain 01/09/2016   Hip pain 04/13/2012   Past Medical History:  Diagnosis Date   Anxiety    Asthma    Complication of anesthesia    Depression    Dyspnea    asthma flaring   Dysrhythmia    PONV (postoperative nausea and vomiting)    Family History  Problem Relation Age of Onset   Migraines Mother    Migraines Father    Migraines Brother    Migraines Son    Migraines Daughter    Past Surgical History:  Procedure Laterality Date   CESAREAN SECTION     cist     removed from thumb   TUBAL LIGATION     Social History   Occupational History   Not on file  Tobacco Use   Smoking status: Never   Smokeless tobacco: Never  Vaping Use   Vaping Use: Never used  Substance and Sexual Activity   Alcohol use: Yes    Alcohol/week: 2.0 standard drinks of alcohol    Types: 2 Glasses of wine per week    Comment: occas.   Drug use: Never   Sexual activity: Not on file

## 2022-08-16 NOTE — Progress Notes (Unsigned)
Functional Pain Scale - descriptive words and definitions  Distressing (6)    Pain is present/unable to complete most ADLs limited by pain/sleep is difficult and active distraction is only marginal. Moderate range order  Average Pain 2-3 per patient  Lower back pain with radiation in both legs, mainly on the right

## 2022-08-30 ENCOUNTER — Telehealth: Payer: Self-pay | Admitting: Neurology

## 2022-08-30 NOTE — Telephone Encounter (Signed)
Pt called stated she needs to talk to nurse about botox billing. Stated she talk to [name] and was told she need to talk to her nurse.

## 2022-08-31 NOTE — Telephone Encounter (Signed)
Called and spoke to patient and she states she got a high bill (not from our office) following her botox appointment, I advised her from our end she was approved through the PA and should reach out to where the bill was from (see phone note from 02.07.24) and that should she receive a high bill from our office that she should reach back out. She was agreeable to this plan and I stated I would show her the approval I see on my end via mychart.

## 2022-09-15 DIAGNOSIS — D485 Neoplasm of uncertain behavior of skin: Secondary | ICD-10-CM | POA: Diagnosis not present

## 2022-09-15 DIAGNOSIS — L988 Other specified disorders of the skin and subcutaneous tissue: Secondary | ICD-10-CM | POA: Diagnosis not present

## 2022-09-20 ENCOUNTER — Telehealth: Payer: Self-pay | Admitting: Neurology

## 2022-09-20 NOTE — Telephone Encounter (Signed)
Sent mychart msg informing pt of appt change due to provider schedule change 

## 2022-09-20 NOTE — Telephone Encounter (Signed)
Pt called. Requesting a call back from nurse to discuss what she needs to tell her insurance when she called them. Pt said I'm not trying to be difficult but I need some help.

## 2022-09-27 ENCOUNTER — Ambulatory Visit: Payer: BC Managed Care – PPO | Admitting: Internal Medicine

## 2022-09-27 ENCOUNTER — Ambulatory Visit: Payer: BC Managed Care – PPO | Admitting: Physical Medicine and Rehabilitation

## 2022-10-05 ENCOUNTER — Encounter: Payer: Self-pay | Admitting: Physical Medicine and Rehabilitation

## 2022-10-05 ENCOUNTER — Ambulatory Visit: Payer: BC Managed Care – PPO | Admitting: Physical Medicine and Rehabilitation

## 2022-10-05 DIAGNOSIS — G8929 Other chronic pain: Secondary | ICD-10-CM | POA: Diagnosis not present

## 2022-10-05 DIAGNOSIS — M5116 Intervertebral disc disorders with radiculopathy, lumbar region: Secondary | ICD-10-CM | POA: Diagnosis not present

## 2022-10-05 DIAGNOSIS — M5416 Radiculopathy, lumbar region: Secondary | ICD-10-CM

## 2022-10-05 NOTE — Progress Notes (Unsigned)
Functional Pain Scale - descriptive words and definitions  No Pain (0)   No Pain/Loss of function  Average Pain 0  Lower back pain. Patient is feeling better

## 2022-10-05 NOTE — Progress Notes (Unsigned)
Chelsea Flores - 52 y.o. female MRN [id number]  Date of birth: 12/26/1970  Office Visit Note: Visit Date: 10/05/2022 PCP: Chelsea Corn, MD Referred by: Chelsea Corn, MD  Subjective: Chief Complaint  Patient presents with   Lower Back - Pain   HPI: Chelsea Flores is a 52 y.o. female who comes in today for evaluation of acute on chronic bilateral lower back pain radiating down both legs, right greater than left. Patient is here today for follow up from previous office visit on 08/16/2022. She reports complete resolution of lower back pain and radicular symptoms with oral medications and continued physician directed home exercise regimen. She is unable to take NSAIDS due to history of gastrointestinal ulcer. Lumbar MRI imaging from 2021 exhibits right central disc protrusion/annular tear at L4-L5, small central disc protrusion at L5-S1. No high grade spinal canal stenosis noted. No history of lumbar injections/surgery. Patient currently employed as International aid/development worker. Patient denies focal weakness, numbness and tingling. No recent trauma or falls.    Review of Systems  Musculoskeletal:  Negative for back pain.  Neurological:  Negative for tingling, sensory change, focal weakness and weakness.  All other systems reviewed and are negative.  Otherwise per HPI.  Assessment & Plan: Visit Diagnoses:    ICD-10-CM   1. Chronic bilateral low back pain with right-sided sciatica  M54.41    G89.29     2. Lumbar radiculopathy  M54.16     3. Intervertebral disc disorders with radiculopathy, lumbar region  M51.16        Plan: Findings:  Acute on chronic bilateral lower back pain radiating down both legs, right greater than left. Complete resolution of pain with oral medications and continued formal physical therapy. Overall, patient feels much better and reports increased functional ability. We will continue to monitor. Would recommend new lumbar MRI imaging if her symptoms persist. I also  discussed possibility of performing lumbar epidural steroid injection. I encouraged patient to continue with consistent home exercise regimen as I do feel this will limit future exacerbations. No red flag symptoms noted upon exam today.     Meds & Orders: No orders of the defined types were placed in this encounter.  No orders of the defined types were placed in this encounter.   Follow-up: Return if symptoms worsen or fail to improve.   Procedures: No procedures performed      Clinical History: CLINICAL DATA:  Low back pain   EXAM: MRI LUMBAR SPINE WITHOUT CONTRAST   TECHNIQUE: Multiplanar, multisequence MR imaging of the lumbar spine was performed. No intravenous contrast was administered.   COMPARISON:  03/18/2019   FINDINGS: Segmentation:  Standard.   Alignment:  Stable.   Vertebrae: Stable vertebral body heights. No substantial marrow edema. No suspicious osseous lesion.   Conus medullaris and cauda equina: Conus extends to the L1 level. Conus and cauda equina appear normal.   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 Cholelithiasis.   Disc levels:   L1-L2:  No canal or foraminal stenosis.   L2-L3:  No canal or foraminal stenosis.   L3-L4:  No canal or foraminal stenosis.   L4-L5: Right central disc protrusion. Mild facet arthropathy. No canal or foraminal stenosis.   L5-S1: Small central disc protrusion. No canal or foraminal stenosis.   IMPRESSION: Mild lower lumbar degenerative changes similar to 2020 examination. No significant stenosis.     Electronically Signed   By: Guadlupe Spanish M.D.   On: 04/02/2020  12:55   She reports that she has never smoked. She has never used smokeless tobacco. No results for input(s): "HGBA1C", "LABURIC" in the last 8760 hours.  Objective:  VS:  HT:    WT:   BMI:     BP:   HR: bpm  TEMP: ( )  RESP:  Physical Exam Vitals and nursing note reviewed.  HENT:     Head:  Normocephalic and atraumatic.     Right Ear: External ear normal.     Left Ear: External ear normal.     Nose: Nose normal.     Mouth/Throat:     Mouth: Mucous membranes are moist.  Eyes:     Extraocular Movements: Extraocular movements intact.  Cardiovascular:     Rate and Rhythm: Normal rate.     Pulses: Normal pulses.  Pulmonary:     Effort: Pulmonary effort is normal.  Abdominal:     General: Abdomen is flat. There is no distension.  Musculoskeletal:        General: Tenderness present.     Cervical back: Normal range of motion.     Comments: Patient rises from seated position to standing without difficulty. Good lumbar range of motion. No pain noted with facet loading. 5/5 strength noted with bilateral hip flexion, knee flexion/extension, ankle dorsiflexion/plantarflexion and EHL. No clonus noted bilaterally. No pain upon palpation of greater trochanters. No pain with internal/external rotation of bilateral hips. Sensation intact bilaterally. Ambulates without aid, gait steady.       Skin:    General: Skin is warm and dry.     Capillary Refill: Capillary refill takes less than 2 seconds.  Neurological:     General: No focal deficit present.     Mental Status: She is alert and oriented to person, place, and time.  Psychiatric:        Mood and Affect: Mood normal.        Behavior: Behavior normal.     Ortho Exam  Imaging: No results found.  Past Medical/Family/Surgical/Social History: Medications & Allergies reviewed per EMR, new medications updated. Patient Active Problem List   Diagnosis Date Noted   Chronic migraine without aura without status migrainosus, not intractable 11/30/2021   Left foot pain 01/09/2016   Hip pain 04/13/2012   Past Medical History:  Diagnosis Date   Anxiety    Asthma    Back pain    Complication of anesthesia    Depression    Dyspnea    asthma flaring   Dysrhythmia    Insomnia    Palpitations    PONV (postoperative nausea and  vomiting)    PVC (premature ventricular contraction)    Snores    Tenosynovitis of thumb    Vertigo    Family History  Problem Relation Age of Onset   Migraines Mother    Migraines Father    Migraines Brother    Migraines Son    Migraines Daughter    Past Surgical History:  Procedure Laterality Date   CESAREAN SECTION     cist     removed from thumb   TUBAL LIGATION     Social History   Occupational History   Not on file  Tobacco Use   Smoking status: Never   Smokeless tobacco: Never  Vaping Use   Vaping Use: Never used  Substance and Sexual Activity   Alcohol use: Yes    Alcohol/week: 2.0 standard drinks of alcohol    Types: 2 Glasses of wine per  week    Comment: occas.   Drug use: Never   Sexual activity: Not on file

## 2022-10-24 DIAGNOSIS — H60332 Swimmer's ear, left ear: Secondary | ICD-10-CM | POA: Diagnosis not present

## 2022-10-26 ENCOUNTER — Encounter: Payer: Self-pay | Admitting: Neurology

## 2022-10-26 MED ORDER — ONABOTULINUMTOXINA 200 UNITS IJ SOLR: 200.0000 [IU] | INTRAMUSCULAR | 2 refills | Status: DC

## 2022-10-26 NOTE — Telephone Encounter (Signed)
No PA required for 16109 or [id number], chat ref# [id number].

## 2022-10-26 NOTE — Telephone Encounter (Signed)
Per [employer] this needs to be done through a [facility], [facility] no longer dispenses Botox. Please send Rx to [facility].

## 2022-10-26 NOTE — Telephone Encounter (Signed)
Refill sent.

## 2022-10-26 NOTE — Addendum Note (Signed)
Addended by: Eather Colas E on: 10/26/2022 01:51 PM   Modules accepted: Orders

## 2022-11-01 ENCOUNTER — Ambulatory Visit (INDEPENDENT_AMBULATORY_CARE_PROVIDER_SITE_OTHER): Payer: BC Managed Care – PPO

## 2022-11-01 ENCOUNTER — Encounter: Payer: Self-pay | Admitting: Cardiology

## 2022-11-01 ENCOUNTER — Ambulatory Visit: Payer: BC Managed Care – PPO | Attending: Internal Medicine | Admitting: Cardiology

## 2022-11-01 VITALS — BP 92/70 | HR 63 | Ht 68.0 in | Wt 155.4 lb

## 2022-11-01 DIAGNOSIS — R002 Palpitations: Secondary | ICD-10-CM

## 2022-11-01 NOTE — Progress Notes (Unsigned)
Enrolled for [employer] to mail a ZIO XT long term holter monitor to the patients address on file.  

## 2022-11-01 NOTE — Progress Notes (Signed)
Cardiology Office Note:    Date:  11/01/2022   ID:  Leanord Hawking, DOB 09-09-1970, MRN [id number]  PCP:  Creola Corn, MD   [facility] Providers Cardiologist:  None     Referring MD: Olevia Perches, NP     History of Present Illness:    Chelsea Flores is a 52 y.o. female here for the evaluation of palpitations at the request of Dr. Timothy Lasso.  She is a Technical sales engineer.  Previously she had palpitations first noticeable with her daughter's pregnancy.  Since then she has felt them off and on throughout the years.  A few months ago she noted for about a minute every 3-4 beats she was having a PVC or PAC.  This occurred at night while relaxing.  She states that it is not atrial fibrillation.  Family history is positive for clotting disorder both parents and brother had PEs.  They are on lifelong Xarelto.  She also has asthma as well as migraine.  No significant alcohol use.  Non-smoker.  Has run marathons.  Enjoys hiking with daughter.  Going to [place] and is going to be at high altitudes.  Her natural respiratory rate is shallow and fast.  No syncope. Has low BP.   Past Medical History:  Diagnosis Date   Anxiety    Asthma    Back pain    Complication of anesthesia    Depression    Dyspnea    asthma flaring   Dysrhythmia    Insomnia    Palpitations    PONV (postoperative nausea and vomiting)    PVC (premature ventricular contraction)    Snores    Tenosynovitis of thumb    Vertigo     Past Surgical History:  Procedure Laterality Date   CESAREAN SECTION     cist     removed from thumb   TUBAL LIGATION      Current Medications: Current Meds  Medication Sig   albuterol (VENTOLIN HFA) 108 (90 Base) MCG/ACT inhaler Inhale 2 puffs into the lungs as needed.   botulinum toxin Type A (BOTOX) 200 units injection Provider to inject 155 units into the muscles of the head and neck every three months. Discard remainder.   botulinum toxin Type A  (BOTOX) 200 units injection Inject 200 Units into the muscle every 3 (three) months.   eszopiclone (LUNESTA) 2 MG TABS tablet Lunesta 2 mg tablet  Take 1 tablet as needed by oral route at bedtime.   Pediatric Multiple Vitamins (FLINTSTONES MULTIVITAMIN PO) Take 1 tablet by mouth daily.   rizatriptan (MAXALT-MLT) 10 MG disintegrating tablet Take 1 tablet (10 mg total) by mouth as needed for migraine. May repeat in 2 hours if needed   spironolactone (ALDACTONE) 25 MG tablet Take 1 tablet po q daily Orally as directed for 90 days   SYRINGE-NEEDLE, DISP, 3 ML (BD SAFETYGLIDE SYRINGE/NEEDLE) 25G X 1" 3 ML MISC Attach needle to syringe and use to draw up and administer Toradol. Do not reuse.   Ubrogepant (UBRELVY) 100 MG TABS Take 1 tablet (100 mg total) by mouth as needed (take 1 tablet at onset of headache, may repeat 1 tablet if needed, max is 200 mg/day).     Allergies:   Nsaids   Social History   Socioeconomic History   Marital status: Married    Spouse name: Not on file   Number of children: Not on file   Years of education: Not on file   Highest  education level: Not on file  Occupational History   Not on file  Tobacco Use   Smoking status: Never   Smokeless tobacco: Never  Vaping Use   Vaping Use: Never used  Substance and Sexual Activity   Alcohol use: Yes    Alcohol/week: 2.0 standard drinks of alcohol    Types: 2 Glasses of wine per week    Comment: occas.   Drug use: Never   Sexual activity: Not on file  Other Topics Concern   Not on file  Social History Narrative   Not on file   Social Determinants of Health   Financial Resource Strain: Not on file  Food Insecurity: Not on file  Transportation Needs: Not on file  Physical Activity: Not on file  Stress: Not on file  Social Connections: Not on file     Family History: The patient's family history includes Migraines in her brother, daughter, father, mother, and son.  ROS:   Please see the history of present  illness.     All other systems reviewed and are negative.  EKGs/Labs/Other Studies Reviewed:    The following studies were reviewed today: Prior office notes reviewed EKG Interpretation  Date/Time:  Monday November 01 2022 15:41:50 EDT Ventricular Rate:  63 PR Interval:  158 QRS Duration: 76 QT Interval:  412 QTC Calculation: 421 R Axis:   77 Text Interpretation: Normal sinus rhythm Normal ECG Confirmed by Donato Schultz ([id number]) on 11/01/2022 4:02:37 PM    Recent Labs: No results found for requested labs within last 365 days.  Recent Lipid Panel No results found for: "CHOL", "TRIG", "HDL", "CHOLHDL", "VLDL", "LDLCALC", "LDLDIRECT"   Risk Assessment/Calculations:               Physical Exam:    VS:  BP 92/70   Pulse 63   Ht 5\' 8"  (1.727 m)   Wt 155 lb 6.4 oz (70.5 kg)   SpO2 95%   BMI 23.63 kg/m     Wt Readings from Last 3 Encounters:  11/01/22 155 lb 6.4 oz (70.5 kg)  08/10/22 150 lb (68 kg)  06/16/22 150 lb (68 kg)     GEN:  Well nourished, well developed in no acute distress HEENT: Normal NECK: No JVD; No carotid bruits LYMPHATICS: No lymphadenopathy CARDIAC: RRR, no murmurs, rubs, gallops RESPIRATORY:  Clear to auscultation without rales, wheezing or rhonchi  ABDOMEN: Soft, non-tender, non-distended MUSCULOSKELETAL:  No edema; No deformity  SKIN: Warm and dry NEUROLOGIC:  Alert and oriented x 3 PSYCHIATRIC:  Normal affect   ASSESSMENT:    1. Palpitations    PLAN:    In order of problems listed above:  Palpitations -Likely PVCs or PACs.  Classic description.  She does not drink caffeine as this is a migraine trigger.  We will check a Zio patch monitor for further quantification.  We can treat this conservatively.  If highly symptomatic could consider low-dose short acting diltiazem transiently.  I would be concerned that this may decrease her blood pressure further and may cause symptoms.  She was concerned about the potential for metoprolol and  asthma.  Clotting disorder in family - She has been tested by Dr. Timothy Lasso.  Negative.  She is still being careful especially on long flights for instance.           Medication Adjustments/Labs and Tests Ordered: Current medicines are reviewed at length with the patient today.  Concerns regarding medicines are outlined above.  Orders Placed This Encounter  Procedures   LONG TERM MONITOR (3-14 DAYS)   EKG 12-Lead   No orders of the defined types were placed in this encounter.   Patient Instructions  Medication Instructions:  The current medical regimen is effective;  continue present plan and medications.  *If you need a refill on your cardiac medications before your next appointment, please call your pharmacy*  Testing/Procedures: ZIO XT- Long Term Monitor Instructions  Your physician has requested you wear a ZIO patch monitor for 14 days.  This is a single patch monitor. [employer] supplies one patch monitor per enrollment. Additional stickers are not available. Please do not apply patch if you will be having a Nuclear Stress Test,  Echocardiogram, Cardiac CT, MRI, or Chest Xray during the period you would be wearing the  monitor. The patch cannot be worn during these tests. You cannot remove and re-apply the  ZIO XT patch monitor.  Your ZIO patch monitor will be mailed 3 day USPS to your address on file. It may take 3-5 days  to receive your monitor after you have been enrolled.  Once you have received your monitor, please review the enclosed instructions. Your monitor  has already been registered assigning a specific monitor serial # to you.  Billing and Patient Assistance Program Information  We have supplied [employer] with any of your insurance information on file for billing purposes. [employer] offers a sliding scale Patient Assistance Program for patients that do not have  insurance, or whose insurance does not completely cover the cost of the ZIO monitor.  You must apply  for the Patient Assistance Program to qualify for this discounted rate.  To apply, please call [employer] at [phone], select option 4, select option 2, ask to apply for  Patient Assistance Program. Meredeth Ide will ask your household income, and how many people  are in your household. They will quote your out-of-pocket cost based on that information.  [employer] will also be able to set up a 61-month, interest-free payment plan if needed.  Applying the monitor   Shave hair from upper left chest.  Hold abrader disc by orange tab. Rub abrader in 40 strokes over the upper left chest as  indicated in your monitor instructions.  Clean area with 4 enclosed alcohol pads. Let dry.  Apply patch as indicated in monitor instructions. Patch will be placed under collarbone on left  side of chest with arrow pointing upward.  Rub patch adhesive wings for 2 minutes. Remove white label marked "1". Remove the white  label marked "2". Rub patch adhesive wings for 2 additional minutes.  While looking in a mirror, press and release button in center of patch. A small green light will  flash 3-4 times. This will be your only indicator that the monitor has been turned on.  Do not shower for the first 24 hours. You may shower after the first 24 hours.  Press the button if you feel a symptom. You will hear a small click. Record Date, Time and  Symptom in the Patient Logbook.  When you are ready to remove the patch, follow instructions on the last 2 pages of Patient  Logbook. Stick patch monitor onto the last page of Patient Logbook.  Place Patient Logbook in the blue and white box. Use locking tab on box and tape box closed  securely. The blue and white box has prepaid postage on it. Please place it in the mailbox as  soon as possible. Your physician should have your test results approximately  7 days after the  monitor has been mailed back to [place].  Call [facility] Customer Care at [phone] if you  have questions regarding  your ZIO XT patch monitor. Call them immediately if you see an orange light blinking on your  monitor.  If your monitor falls off in less than 4 days, contact our Monitor department at [phone].  If your monitor becomes loose or falls off after 4 days call [employer] at [phone] for  suggestions on securing your monitor   Follow-Up: At [facility] you and your health needs are our priority.  As part of our continuing mission to provide you with exceptional heart care, we have created designated Provider Care Teams.  These Care Teams include your primary Cardiologist (physician) and Advanced Practice Providers (APPs -  Physician Assistants and Nurse Practitioners) who all work together to provide you with the care you need, when you need it.  We recommend signing up for the patient portal called "MyChart".  Sign up information is provided on this After Visit Summary.  MyChart is used to connect with patients for Virtual Visits (Telemedicine).  Patients are able to view lab/test results, encounter notes, upcoming appointments, etc.  Non-urgent messages can be sent to your provider as well.   To learn more about what you can do with MyChart, go to [email or web address].    Your next appointment:   Follow up will be determined after the above results have been obtained.     Signed, Donato Schultz, MD  11/01/2022 5:34 PM    [facility]

## 2022-11-01 NOTE — Patient Instructions (Signed)
Medication Instructions:  The current medical regimen is effective;  continue present plan and medications.  *If you need a refill on your cardiac medications before your next appointment, please call your pharmacy*  Testing/Procedures: ZIO XT- Long Term Monitor Instructions  Your physician has requested you wear a ZIO patch monitor for 14 days.  This is a single patch monitor. [employer] supplies one patch monitor per enrollment. Additional stickers are not available. Please do not apply patch if you will be having a Nuclear Stress Test,  Echocardiogram, Cardiac CT, MRI, or Chest Xray during the period you would be wearing the  monitor. The patch cannot be worn during these tests. You cannot remove and re-apply the  ZIO XT patch monitor.  Your ZIO patch monitor will be mailed 3 day USPS to your address on file. It may take 3-5 days  to receive your monitor after you have been enrolled.  Once you have received your monitor, please review the enclosed instructions. Your monitor  has already been registered assigning a specific monitor serial # to you.  Billing and Patient Assistance Program Information  We have supplied [employer] with any of your insurance information on file for billing purposes. [employer] offers a sliding scale Patient Assistance Program for patients that do not have  insurance, or whose insurance does not completely cover the cost of the ZIO monitor.  You must apply for the Patient Assistance Program to qualify for this discounted rate.  To apply, please call [employer] at [phone], select option 4, select option 2, ask to apply for  Patient Assistance Program. Meredeth Ide will ask your household income, and how many people  are in your household. They will quote your out-of-pocket cost based on that information.  [employer] will also be able to set up a 38-month, interest-free payment plan if needed.  Applying the monitor   Shave hair from upper left chest.  Hold abrader disc  by orange tab. Rub abrader in 40 strokes over the upper left chest as  indicated in your monitor instructions.  Clean area with 4 enclosed alcohol pads. Let dry.  Apply patch as indicated in monitor instructions. Patch will be placed under collarbone on left  side of chest with arrow pointing upward.  Rub patch adhesive wings for 2 minutes. Remove white label marked "1". Remove the white  label marked "2". Rub patch adhesive wings for 2 additional minutes.  While looking in a mirror, press and release button in center of patch. A small green light will  flash 3-4 times. This will be your only indicator that the monitor has been turned on.  Do not shower for the first 24 hours. You may shower after the first 24 hours.  Press the button if you feel a symptom. You will hear a small click. Record Date, Time and  Symptom in the Patient Logbook.  When you are ready to remove the patch, follow instructions on the last 2 pages of Patient  Logbook. Stick patch monitor onto the last page of Patient Logbook.  Place Patient Logbook in the blue and white box. Use locking tab on box and tape box closed  securely. The blue and white box has prepaid postage on it. Please place it in the mailbox as  soon as possible. Your physician should have your test results approximately 7 days after the  monitor has been mailed back to [facility].  Call [facility] Customer Care at [phone] if you have questions regarding  your ZIO XT patch  monitor. Call them immediately if you see an orange light blinking on your  monitor.  If your monitor falls off in less than 4 days, contact our Monitor department at [phone].  If your monitor becomes loose or falls off after 4 days call [employer] at [phone] for  suggestions on securing your monitor   Follow-Up: At [facility], you and your health needs are our priority.  As part of our continuing mission to provide you with exceptional heart care,  we have created designated Provider Care Teams.  These Care Teams include your primary Cardiologist (physician) and Advanced Practice Providers (APPs -  Physician Assistants and Nurse Practitioners) who all work together to provide you with the care you need, when you need it.  We recommend signing up for the patient portal called "MyChart".  Sign up information is provided on this After Visit Summary.  MyChart is used to connect with patients for Virtual Visits (Telemedicine).  Patients are able to view lab/test results, encounter notes, upcoming appointments, etc.  Non-urgent messages can be sent to your provider as well.   To learn more about what you can do with MyChart, go to [email or web address].    Your next appointment:   Follow up will be determined after the above results have been obtained.

## 2022-11-02 NOTE — Telephone Encounter (Signed)
   This is from Chelsea Flores's last OV, Can this be helpful?

## 2022-11-02 NOTE — Telephone Encounter (Signed)
I called [employer] to try and figure out why previous claim was denied. Auth team had stated no auth was needed for Botox. The rep informed me that [employer] does not handle Botox, that auth would need to come from [employer]. I submitted an auth to [employer] and received a denial back, stating that pt would need to try and fail three of the following drugs before Botox would be approved: Aimovig, Ajovy, Emgality, Qulipta, Dysport, Myobloc.   Let me know what you guys would like to do! She is scheduled for Botox on 7/16.

## 2022-11-06 DIAGNOSIS — R002 Palpitations: Secondary | ICD-10-CM | POA: Diagnosis not present

## 2022-11-08 NOTE — Telephone Encounter (Signed)
I'm only seeing Aimovig from the list on there (correct me if I'm wrong), insurance wants her to try/fail at least 3 of these: Aimovig, Ajovy, Emgality, Qulipta, Dysport, Myobloc.

## 2022-11-09 ENCOUNTER — Ambulatory Visit: Payer: BC Managed Care – PPO | Admitting: Neurology

## 2022-11-15 NOTE — Telephone Encounter (Signed)
Received fax that Botox appeal has unfortunately been denied. It looks like they're still wanting her to try at least one more of their listed drug. I've attached denial letter.

## 2022-11-23 ENCOUNTER — Encounter: Payer: Self-pay | Admitting: Neurology

## 2022-11-23 ENCOUNTER — Ambulatory Visit: Payer: BC Managed Care – PPO | Admitting: Neurology

## 2022-11-23 VITALS — BP 109/68 | HR 70 | Ht 68.0 in | Wt 155.0 lb

## 2022-11-23 DIAGNOSIS — G43709 Chronic migraine without aura, not intractable, without status migrainosus: Secondary | ICD-10-CM | POA: Diagnosis not present

## 2022-11-23 MED ORDER — RIZATRIPTAN BENZOATE 10 MG PO TBDP: 10.0000 mg | ORAL_TABLET | ORAL | 11 refills | Status: DC | PRN

## 2022-11-23 MED ORDER — EMGALITY 120 MG/ML ~~LOC~~ SOAJ: 120.0000 mg | SUBCUTANEOUS | 11 refills | Status: DC

## 2022-11-23 MED ORDER — EMGALITY 120 MG/ML ~~LOC~~ SOAJ: 240.0000 mg | Freq: Once | SUBCUTANEOUS | 0 refills | Status: AC

## 2022-11-23 NOTE — Progress Notes (Signed)
Patient: Chelsea Flores Date of Birth: [birth year]  Reason for Visit: Follow up History from: Patient Primary Neurologist: Lucia Gaskins   ASSESSMENT AND PLAN 52 y.o. year old female   1.  Chronic migraine headaches -Had superb benefit with initial Botox injection in April 2024, but insurance denied subsequent injection due to requiring trial of further medication -We will start Emgality with loading dose for migraine prevention -Continue Ubrelvy, Maxalt as needed for acute headache -Currently about 15 headache days a month, 2-4 are migraines that can linger for several days  -Previously tried and failed: Ajovy, Aimovig contraindicated due to constipation. Gabapentin, Topiramate(hair loss, numbness and tingling and cannot perform surgery), tylenol, flexeril, decadron, lexapro, propranolol(asthmatic,contraindicated), amitriptyline/nortriptyline(sedation and weight gain), robaxin, nsaids, OTC analgesics, verapamil and other BP meds like carvedilol (contraindicated due to low blood pressure), imitrex, ubrelvy(helps),  depakote. -Next Steps: restart Botox -Has virtual visit with Dr. Lucia Gaskins in November, will keep that for now  Meds ordered this encounter  Medications   Galcanezumab-gnlm (EMGALITY) 120 MG/ML SOAJ    Sig: Inject 240 mg into the skin once for 1 dose.    Dispense:  2 mL    Refill:  0    Fill this 1st as loading dose.   Galcanezumab-gnlm (EMGALITY) 120 MG/ML SOAJ    Sig: Inject 120 mg into the skin every 30 (thirty) days.    Dispense:  1.12 mL    Refill:  11    Fill this 2nd as maintenance dosing.   rizatriptan (MAXALT-MLT) 10 MG disintegrating tablet    Sig: Take 1 tablet (10 mg total) by mouth as needed for migraine. May repeat in 2 hours if needed    Dispense:  9 tablet    Refill:  11   HISTORY OF PRESENT ILLNESS: Today 11/23/22 Had her first Botox injection 08/10/2022.  Her insurance denied subsequent injections, new knowledge that her insurance did require PA.  Here  today to discuss treatment options.  Insurance requiring 3 equivalent formulary drugs (Aimovig, [place], [place], [place]). With Botox, no migraines the 1st and 2nd month, in the 3rd month she had 4 migraines as Botox wore off. Migraines can cluster. Migraine process can linger 2-3 days with cognitive symptoms. Using [employer] as rescue.   Tried: Gabapentin, Topiramate(hair loss, numbness and tingling and cannot perform surgery), tylenol, flexeril, decadron, lexapro, propranolol(asthmatic,contraindicated), amitriptyline/nortriptyline(sedation and weight gain), robaxin, nsaids, OTC analgesics, verapamil and other BP meds like carvedilol (contraindicated due to low blood pressure), imitrex, ubrelvy(helps), Aimovig contraindicated due to constipation, depakote.  06/16/22 SS: Here today today to discuss Botox. Remains on Ajovy. Several breakthrough headaches, with nausea, tried Nurtec, didn't help. Another time took [place] and Maxalt was able to stay at work. 1 spell of right trigeminal pain, sent her to the floor in [employer]. On Ajovy since July 2023. Doing her injection to abdomen, has had redness x 3 months. Just did Ajovy 06/11/22. This past month 12-15 headache days. Her migraines are episodic. Is perimenopausal. Her migraines are episodic. Has not taken Toradol injection due to gastric ulcer. Planning to travel to [place] for the next 2 weeks.   HISTORY 04/05/2022: Things are getting better. The Arnetha Massy is working well. She will make sure to let me know if she likes maxalt, ubrelvy or nurtec and we will order for her.  She was having On average 15 mod to severe migraine days a month, now <= 4 a month and no headaches.    Patient complains of symptoms per HPI as well as the  following symptoms: improved migraines . Pertinent negatives and positives per HPI. All others negative     HPI:  Chelsea Flores is a 52 y.o. female here as requested by Creola Corn, MD for migraines. Started in college with vomiting,  nausea, visual loss right lateral and dot on the left and has visual auras. She is well aware of stroke risk with migraine with aura. A big trigger is hormones. Caffeine is also a trigger. She is peri-menopausal, dehydration has been a trigger, taking too much advil has caused rebound and ulcers and now she does not take them, no medication overuse, in 2018 she would get 5-7 migraine in a week or two (not cluster headaches). She can get a dot in the center and slowly gets bigger and bigger when she has a visual aura. Her migraines are unilateral, pulsating/pounding/throbbing, movement makes it worse, unilateral, moderate to severe, can last upwards of 24-72 hours or longer untreated. Photophobia,phonophobia, daily headaches, she has a lot of neck tightness, she has pain where the muscles pop up when clenching. On average 15 mod to severe migraine days a month > 5 years. Every first degree relative has migraines. No changes in quality, not positional or exertional, no morning headaches, no vision changes. No other focal neurologic deficits, associated symptoms, inciting events or modifiable factors.   Reviewed notes, labs and imaging from outside physicians, which showed:   Tried: Gabapentin, Topiramate(hair loss, numbness and tingling and cannot perform surgery), tylenol, flexeril, decadron, lexapro, propranolol(asthmatic,contraindicated), amitriptyline/nortriptyline(sedation and weight gain), robaxin, nsaids, OTC analgesics, verapamil and other BP meds like carvedilol (contraindicated due to low blood pressure), imitrex, ubrelvy(helps), Aimovig contraindicated due to constipation, depakote.       REVIEW OF SYSTEMS: Out of a complete 14 system review of symptoms, the patient complains only of the following symptoms, and all other reviewed systems are negative.  See HPI  ALLERGIES: Allergies  Allergen Reactions   Nsaids Other (See Comments)    HOME MEDICATIONS: Outpatient Medications Prior to Visit   Medication Sig Dispense Refill   albuterol (VENTOLIN HFA) 108 (90 Base) MCG/ACT inhaler Inhale 2 puffs into the lungs as needed.     eszopiclone (LUNESTA) 2 MG TABS tablet Lunesta 2 mg tablet  Take 1 tablet as needed by oral route at bedtime.     Pediatric Multiple Vitamins (FLINTSTONES MULTIVITAMIN PO) Take 1 tablet by mouth daily.     spironolactone (ALDACTONE) 25 MG tablet Take 1 tablet po q daily Orally as directed for 90 days     Ubrogepant (UBRELVY) 100 MG TABS Take 1 tablet (100 mg total) by mouth as needed (take 1 tablet at onset of headache, may repeat 1 tablet if needed, max is 200 mg/day). 16 tablet 11   rizatriptan (MAXALT-MLT) 10 MG disintegrating tablet Take 1 tablet (10 mg total) by mouth as needed for migraine. May repeat in 2 hours if needed 9 tablet 11   botulinum toxin Type A (BOTOX) 200 units injection Provider to inject 155 units into the muscles of the head and neck every three months. Discard remainder. 1 each 2   botulinum toxin Type A (BOTOX) 200 units injection Inject 200 Units into the muscle every 3 (three) months. 1 each 2   SYRINGE-NEEDLE, DISP, 3 ML (BD SAFETYGLIDE SYRINGE/NEEDLE) 25G X 1" 3 ML MISC Attach needle to syringe and use to draw up and administer Toradol. Do not reuse. 4 each 5   No facility-administered medications prior to visit.    PAST MEDICAL HISTORY:  Past Medical History:  Diagnosis Date   Anxiety    Asthma    Back pain    Complication of anesthesia    Depression    Dyspnea    asthma flaring   Dysrhythmia    Headache    Insomnia    Palpitations    PONV (postoperative nausea and vomiting)    PVC (premature ventricular contraction)    Snores    Tenosynovitis of thumb    Vertigo     PAST SURGICAL HISTORY: Past Surgical History:  Procedure Laterality Date   CESAREAN SECTION     cist     removed from thumb   TUBAL LIGATION      FAMILY HISTORY: Family History  Problem Relation Age of Onset   Migraines Mother    Migraines  Father    Migraines Brother    Migraines Son    Migraines Daughter     SOCIAL HISTORY: Social History   Socioeconomic History   Marital status: Married    Spouse name: brian   Number of children: 2   Years of education: Not on file   Highest education level: Doctorate  Occupational History   Not on file  Tobacco Use   Smoking status: Never   Smokeless tobacco: Never  Vaping Use   Vaping status: Never Used  Substance and Sexual Activity   Alcohol use: Yes    Alcohol/week: 2.0 standard drinks of alcohol    Types: 2 Glasses of wine per week    Comment: occas.   Drug use: Never   Sexual activity: Yes    Birth control/protection: None, Surgical  Other Topics Concern   Not on file  Social History Narrative   Not on file   Social Determinants of Health   Financial Resource Strain: Not on file  Food Insecurity: Not on file  Transportation Needs: Not on file  Physical Activity: Not on file  Stress: Not on file  Social Connections: Not on file  Intimate Partner Violence: Not on file    PHYSICAL EXAM  Vitals:   11/23/22 0907  BP: 109/68  Pulse: 70  Weight: 155 lb (70.3 kg)  Height: 5\' 8"  (1.727 m)    Body mass index is 23.57 kg/m.  Generalized: Well developed, in no acute distress  Neurological examination  Mentation: Alert oriented to time, place, history taking. Follows all commands speech and language fluent Cranial nerve II-XII: Pupils were equal round reactive to light. Extraocular movements were full, visual field were full on confrontational test. Facial sensation and strength were normal.  Head turning and shoulder shrug  were normal and symmetric. Motor: The motor testing reveals 5 over 5 strength of all 4 extremities. Good symmetric motor tone is noted throughout.  Sensory: Sensory testing is intact to soft touch on all 4 extremities. No evidence of extinction is noted.  Coordination: Cerebellar testing reveals good finger-nose-finger bilaterally Gait  and station: Gait is normal.   DIAGNOSTIC DATA (LABS, IMAGING, TESTING) - I reviewed patient records, labs, notes, testing and imaging myself where available.  Lab Results  Component Value Date   WBC 4.4 04/01/2020   HGB 14.0 04/01/2020   HCT 41.4 04/01/2020   MCV 92.4 04/01/2020   PLT 222 04/01/2020   No results found for: "NA", "K", "CL", "CO2", "GLUCOSE", "BUN", "CREATININE", "CALCIUM", "PROT", "ALBUMIN", "AST", "ALT", "ALKPHOS", "BILITOT", "GFRNONAA", "GFRAA" No results found for: "CHOL", "HDL", "LDLCALC", "LDLDIRECT", "TRIG", "CHOLHDL" No results found for: "HGBA1C" No results found for: "VITAMINB12" No results  found for: "TSH"  Margie Ege, AGNP-C, DNP 11/23/2022, 9:35 AM [facility] [address], [address] [place], [place] [address] [phone]

## 2022-11-26 ENCOUNTER — Other Ambulatory Visit (HOSPITAL_COMMUNITY): Payer: Self-pay

## 2022-11-26 ENCOUNTER — Telehealth: Payer: Self-pay

## 2022-11-26 NOTE — Telephone Encounter (Signed)
Pharmacy Patient Advocate Encounter   Received notification from CoverMyMeds that prior authorization for Emgality 120MG /ML auto-injectors (migraine) is required/requested.   Insurance verification completed.   The patient is insured through  [employer]  .   Per test claim: PA submitted to [employer] via CoverMyMeds Key/confirmation #/EOC [id number] Status is pending

## 2022-12-13 DIAGNOSIS — Z836 Family history of other diseases of the respiratory system: Secondary | ICD-10-CM | POA: Diagnosis not present

## 2022-12-13 DIAGNOSIS — M25542 Pain in joints of left hand: Secondary | ICD-10-CM | POA: Diagnosis not present

## 2022-12-13 DIAGNOSIS — Z0000 Encounter for general adult medical examination without abnormal findings: Secondary | ICD-10-CM | POA: Diagnosis not present

## 2022-12-13 DIAGNOSIS — Z1389 Encounter for screening for other disorder: Secondary | ICD-10-CM | POA: Diagnosis not present

## 2022-12-13 DIAGNOSIS — Z800 Family history of malignant neoplasm of digestive organs: Secondary | ICD-10-CM | POA: Diagnosis not present

## 2022-12-14 ENCOUNTER — Other Ambulatory Visit: Payer: Self-pay | Admitting: Internal Medicine

## 2022-12-14 ENCOUNTER — Ambulatory Visit
Admission: RE | Admit: 2022-12-14 | Discharge: 2022-12-14 | Disposition: A | Discharge status: Home / Self Care | Payer: BC Managed Care – PPO | Source: Ambulatory Visit | Attending: Internal Medicine | Admitting: Internal Medicine

## 2022-12-14 DIAGNOSIS — M19042 Primary osteoarthritis, left hand: Secondary | ICD-10-CM | POA: Diagnosis not present

## 2022-12-14 DIAGNOSIS — M79642 Pain in left hand: Secondary | ICD-10-CM

## 2022-12-17 ENCOUNTER — Telehealth: Payer: Self-pay

## 2022-12-17 NOTE — Telephone Encounter (Signed)
Received a faxed PA form for Botox from [facility]-Faxed to [facility] to Attention:Jillian. Thanks!

## 2022-12-22 ENCOUNTER — Other Ambulatory Visit (HOSPITAL_COMMUNITY): Payer: Self-pay

## 2023-01-14 ENCOUNTER — Encounter: Payer: Self-pay | Admitting: Neurology

## 2023-01-17 MED ORDER — EMGALITY 120 MG/ML ~~LOC~~ SOAJ: 120.0000 mg | SUBCUTANEOUS | 2 refills | Status: DC

## 2023-01-18 ENCOUNTER — Other Ambulatory Visit: Payer: Self-pay | Admitting: *Deleted

## 2023-01-18 DIAGNOSIS — N959 Unspecified menopausal and perimenopausal disorder: Secondary | ICD-10-CM | POA: Diagnosis not present

## 2023-01-18 MED ORDER — EMGALITY 120 MG/ML ~~LOC~~ SOAJ: 120.0000 mg | SUBCUTANEOUS | 2 refills | Status: DC

## 2023-01-21 ENCOUNTER — Other Ambulatory Visit: Payer: Self-pay | Admitting: Internal Medicine

## 2023-01-21 DIAGNOSIS — E781 Pure hyperglyceridemia: Secondary | ICD-10-CM

## 2023-01-21 DIAGNOSIS — Z0000 Encounter for general adult medical examination without abnormal findings: Secondary | ICD-10-CM

## 2023-01-21 DIAGNOSIS — R7989 Other specified abnormal findings of blood chemistry: Secondary | ICD-10-CM

## 2023-02-07 ENCOUNTER — Ambulatory Visit
Admission: RE | Admit: 2023-02-07 | Discharge: 2023-02-07 | Disposition: A | Discharge status: Home / Self Care | Payer: BC Managed Care – PPO | Source: Ambulatory Visit | Attending: Internal Medicine | Admitting: Internal Medicine

## 2023-02-07 DIAGNOSIS — R945 Abnormal results of liver function studies: Secondary | ICD-10-CM | POA: Diagnosis not present

## 2023-02-07 DIAGNOSIS — R7989 Other specified abnormal findings of blood chemistry: Secondary | ICD-10-CM

## 2023-02-08 DIAGNOSIS — R7989 Other specified abnormal findings of blood chemistry: Secondary | ICD-10-CM | POA: Diagnosis not present

## 2023-02-23 DIAGNOSIS — Z1389 Encounter for screening for other disorder: Secondary | ICD-10-CM | POA: Diagnosis not present

## 2023-02-23 DIAGNOSIS — R7989 Other specified abnormal findings of blood chemistry: Secondary | ICD-10-CM | POA: Diagnosis not present

## 2023-02-23 DIAGNOSIS — R7401 Elevation of levels of liver transaminase levels: Secondary | ICD-10-CM | POA: Diagnosis not present

## 2023-02-23 DIAGNOSIS — Z0000 Encounter for general adult medical examination without abnormal findings: Secondary | ICD-10-CM | POA: Diagnosis not present

## 2023-02-24 DIAGNOSIS — Z83719 Family history of colon polyps, unspecified: Secondary | ICD-10-CM | POA: Diagnosis not present

## 2023-02-24 DIAGNOSIS — R748 Abnormal levels of other serum enzymes: Secondary | ICD-10-CM | POA: Diagnosis not present

## 2023-02-24 DIAGNOSIS — Z860100 Personal history of colon polyps, unspecified: Secondary | ICD-10-CM | POA: Diagnosis not present

## 2023-02-28 DIAGNOSIS — Z1389 Encounter for screening for other disorder: Secondary | ICD-10-CM | POA: Diagnosis not present

## 2023-02-28 DIAGNOSIS — R7989 Other specified abnormal findings of blood chemistry: Secondary | ICD-10-CM | POA: Diagnosis not present

## 2023-02-28 DIAGNOSIS — R748 Abnormal levels of other serum enzymes: Secondary | ICD-10-CM | POA: Diagnosis not present

## 2023-02-28 DIAGNOSIS — M79662 Pain in left lower leg: Secondary | ICD-10-CM | POA: Diagnosis not present

## 2023-02-28 DIAGNOSIS — R7401 Elevation of levels of liver transaminase levels: Secondary | ICD-10-CM | POA: Diagnosis not present

## 2023-02-28 DIAGNOSIS — Z0000 Encounter for general adult medical examination without abnormal findings: Secondary | ICD-10-CM | POA: Diagnosis not present

## 2023-03-17 ENCOUNTER — Other Ambulatory Visit: Payer: Self-pay | Admitting: Obstetrics and Gynecology

## 2023-03-17 DIAGNOSIS — Z1231 Encounter for screening mammogram for malignant neoplasm of breast: Secondary | ICD-10-CM

## 2023-03-25 DIAGNOSIS — R82998 Other abnormal findings in urine: Secondary | ICD-10-CM | POA: Diagnosis not present

## 2023-03-25 DIAGNOSIS — E781 Pure hyperglyceridemia: Secondary | ICD-10-CM | POA: Diagnosis not present

## 2023-03-25 DIAGNOSIS — R5383 Other fatigue: Secondary | ICD-10-CM | POA: Diagnosis not present

## 2023-03-29 DIAGNOSIS — R7989 Other specified abnormal findings of blood chemistry: Secondary | ICD-10-CM | POA: Diagnosis not present

## 2023-03-29 DIAGNOSIS — R002 Palpitations: Secondary | ICD-10-CM | POA: Diagnosis not present

## 2023-04-04 ENCOUNTER — Telehealth: Payer: BC Managed Care – PPO | Admitting: Neurology

## 2023-04-04 ENCOUNTER — Ambulatory Visit
Admission: RE | Admit: 2023-04-04 | Discharge: 2023-04-04 | Disposition: A | Discharge status: Home / Self Care | Payer: No Typology Code available for payment source | Source: Ambulatory Visit | Attending: Internal Medicine | Admitting: Internal Medicine

## 2023-04-04 DIAGNOSIS — Z23 Encounter for immunization: Secondary | ICD-10-CM | POA: Diagnosis not present

## 2023-04-04 DIAGNOSIS — Z0000 Encounter for general adult medical examination without abnormal findings: Secondary | ICD-10-CM | POA: Diagnosis not present

## 2023-04-04 DIAGNOSIS — G4700 Insomnia, unspecified: Secondary | ICD-10-CM | POA: Diagnosis not present

## 2023-04-04 DIAGNOSIS — Z1339 Encounter for screening examination for other mental health and behavioral disorders: Secondary | ICD-10-CM | POA: Diagnosis not present

## 2023-04-04 DIAGNOSIS — Z1331 Encounter for screening for depression: Secondary | ICD-10-CM | POA: Diagnosis not present

## 2023-04-04 DIAGNOSIS — E781 Pure hyperglyceridemia: Secondary | ICD-10-CM

## 2023-04-05 ENCOUNTER — Telehealth: Payer: Self-pay | Admitting: Neurology

## 2023-04-05 ENCOUNTER — Encounter: Payer: Self-pay | Admitting: Neurology

## 2023-04-05 ENCOUNTER — Telehealth: Payer: BC Managed Care – PPO | Admitting: Neurology

## 2023-04-05 ENCOUNTER — Telehealth: Payer: Self-pay | Admitting: *Deleted

## 2023-04-05 DIAGNOSIS — R251 Tremor, unspecified: Secondary | ICD-10-CM | POA: Insufficient documentation

## 2023-04-05 DIAGNOSIS — G43709 Chronic migraine without aura, not intractable, without status migrainosus: Secondary | ICD-10-CM | POA: Diagnosis not present

## 2023-04-05 NOTE — Telephone Encounter (Signed)
-----   Message from Anson Fret sent at 04/05/2023 11:46 AM EST ----- Regarding: Try for botox again please 1.  Chronic migraine headaches, retry botox approval -Had superb benefit with initial Botox injection in April 2024, but insurance denied subsequent injection due to requiring trial of further medication(had to try [employer] for insurance, inurance said: I submitted an auth to [employer] and received a denial back, stating that pt would need to try and fail three of the following drugs before Botox would be approved: Aimovig, Ajovy, Emgality, Qulipta, Dysport, Myobloc.)  -Emgality for 3 months not of significant benefit still with chronic migraines -Currently about daily headache days a month, >15 are migraines that can linger for several days and are moderate to severe and can last up to 24 hours or longer, no aura, no medication overuse. Try to get Dysport 500 units (that is what the insurance said above?)for migraines.   -Previously tried and failed > 3 months: Ajovy, Aimovig, Emgality. Gabapentin, Topiramate(hair loss, numbness and tingling and cannot perform surgery), tylenol, flexeril, decadron, lexapro, propranolol(asthmatic,contraindicated), amitriptyline/nortriptyline(sedation and weight gain), robaxin, nsaids, OTC analgesics, verapamil and other BP meds like carvedilol (contraindicated due to low blood pressure she runs 100-114 systolic), imitrex, ubrelvy(helps),  depakote. Botox helped the most she had one injection and >80% of migraine and headache frequency and seveirty  -Next Steps: restart Botulinum for chronic migraines, imitrex(did nothing), Rizatriptan, Nurtec did nothing, ubrelvy helps with maxalt and can take limited ibuprofen(or toradol) due to previous ulcer when overusing ibuprofen

## 2023-04-05 NOTE — Telephone Encounter (Signed)
Pt scheduled for in office visit with Dr. Lucia Gaskins for 10/04/23 at 3pm

## 2023-04-05 NOTE — Telephone Encounter (Signed)
Called [employer] to submit prior auth over the phone, spoke with Raynelle Fanning (ref # [id number]). She states that [phone] does not require prior auth but [id number] does. Auths for [id number] are done through [employer], phone # [phone]. They handle medical injectables that are billed under medical benefit.  I called [employer] and spoke with [facility], she was able to submit auth for me over the phone. Auth will be for 1 year/4 visits, pending clinical review. Turnaround time for this Berkley Harvey is 71 hours. She gave me a pending case # of [id number].

## 2023-04-05 NOTE — Telephone Encounter (Signed)
Please call patient f/u with dr Lucia Gaskins for tremors and migraines in the office

## 2023-04-05 NOTE — Telephone Encounter (Signed)
Received fax of approval from [employer]. Auth#: [id number] (04/10/23-10/07/23) for 2 visits. Checking with Dr. Lucia Gaskins to see about scheduling.

## 2023-04-05 NOTE — Telephone Encounter (Signed)
Chronic Migraine CPT 64615  Dysport J0586 Units:500  G43.709 Chronic Migraine without aura, not intractable, without status migrainous

## 2023-04-05 NOTE — Progress Notes (Signed)
Patient: Chelsea Flores Date of Birth: Jun 08, 1970  Reason for Visit: Follow up History from: Patient Primary Neurologist: Lucia Gaskins   Virtual Visit via Video Note  I connected with Leanord Hawking on 04/05/23 at 11:00 AM EST by a video enabled telemedicine application and verified that I am speaking with the correct person using two identifiers.  Location: Patient: home Provider: office   I discussed the limitations of evaluation and management by telemedicine and the availability of in person appointments. The patient expressed understanding and agreed to proceed.    Follow Up Instructions:    I discussed the assessment and treatment plan with the patient. The patient was provided an opportunity to ask questions and all were answered. The patient agreed with the plan and demonstrated an understanding of the instructions.   The patient was advised to call back or seek an in-person evaluation if the symptoms worsen or if the condition fails to improve as anticipated.  I provided  40 minutes of non-face-to-face time during this encounter.   Anson Fret, MD   ASSESSMENT AND PLAN 52 y.o. year old female   04/05/2023: She did well on Botox(>80% of migraine and headache frequency and seveirty ) but had to try [employer] for insurance, inurance said: I submitted an auth to [employer] and received a denial back, stating that pt would need to try and fail three of the following drugs before Botox would be approved: Aimovig, Ajovy, Emgality, Qulipta, Dysport, Myobloc.  She still has daily headaches and > 15 migraine days a month will send in for botulinum approval try dysport. Per what insurance said to [place] above.   1.  Chronic migraine headaches -Had superb benefit with initial Botox injection in April 2024, but insurance denied subsequent injection due to requiring trial of further medication -Emgality for 3 months not of significant benefit still with chronic  migraines -Continue Ubrelvy, Maxalt as needed for acute headache -Currently about daily headache days a month, >15 are migraines that can linger for several days and are moderate to severe and can last up to 24 hours or longer, no aura, no medication overuse  -Previously tried and failed > 3 months: Ajovy, Aimovig, Emgality. Gabapentin, Topiramate(hair loss, numbness and tingling and cannot perform surgery), tylenol, flexeril, decadron, lexapro, propranolol(asthmatic,contraindicated), amitriptyline/nortriptyline(sedation and weight gain), robaxin, nsaids, OTC analgesics, verapamil and other BP meds like carvedilol (contraindicated due to low blood pressure she runs 100-114 systolic), imitrex, ubrelvy(helps),  depakote. Botox helped the most she had one injection and >80% of migraine and headache frequency and seveirty  -Next Steps: restart Botulinum for chronic migraines, imitrex(did nothing, Rizatriptan, Nurtec did nothing  Also sounds like essential tremor, no previous history of anti-dopamine drugs, no caffeine daily, discussed parkinson's disease, deep brain stimulation, we have no medications to alter the course except exercise, encouraged exercise, skin checks, and if she likes (dad has PD) we can can evaluate in the office and order DAT scan. She will monitor and we will see her in the office at next appointment.  Acutely uses ubrelvy and rizatriptan and brings it down to a 3/10. Add an ibuprofen(take with food for ulcer protection) she had an ulcer when taking ibuprofen 16 times a month and now would only need it a few times a month as long as < 6x a month I believe it would ok and take with food. Or try the toradol.   Will have her in the office in 6 months, sent note for my staff to  call, I need to examine this tremor appears to be more essential tremor but we will evaluate. Father had PD  Patient complains of symptoms per HPI as well as the following symptoms: none . Pertinent negatives and  positives per HPI. All others negative   No orders of the defined types were placed in this encounter.  HISTORY OF PRESENT ILLNESS: Today 04/05/23 Had her first Botox injection 08/10/2022.  Her insurance denied subsequent injections, new knowledge that her insurance did require PA.  Here today to discuss treatment options.  Insurance requiring 3 equivalent formulary drugs (Aimovig, [place], [place], [place]). With Botox, no migraines the 1st and 2nd month, in the 3rd month she had 4 migraines as Botox wore off. Migraines can cluster. Migraine process can linger 2-3 days with cognitive symptoms. Using [employer] as rescue.   Tried > 3 months: Gabapentin, Topiramate(hair loss, numbness and tingling and cannot perform surgery), tylenol, flexeril, decadron, lexapro, propranolol(asthmatic,contraindicated), amitriptyline/nortriptyline(sedation and weight gain), robaxin, nsaids, OTC analgesics, verapamil and other BP meds like carvedilol (contraindicated due to low blood pressure), imitrex, ubrelvy(helps), Aimovig contraindicated due to constipation, depakote.  06/16/22 SS: Here today today to discuss Botox. Remains on Ajovy. Several breakthrough headaches, with nausea, tried Nurtec, didn't help. Another time took [place] and Maxalt was able to stay at work. 1 spell of right trigeminal pain, sent her to the floor in [employer]. On Ajovy since July 2023. Doing her injection to abdomen, has had redness x 3 months. Just did Ajovy 06/11/22. This past month 12-15 headache days. Her migraines are episodic. Is perimenopausal. Her migraines are episodic. Has not taken Toradol injection due to gastric ulcer. Planning to travel to [place] for the next 2 weeks.   HISTORY 04/05/2022: Things are getting better. The Arnetha Massy is working well. She will make sure to let me know if she likes maxalt, ubrelvy or nurtec and we will order for her.  She was having On average 15 mod to severe migraine days a month, now <= 4 a month and no headaches.     Patient complains of symptoms per HPI as well as the following symptoms: improved migraines . Pertinent negatives and positives per HPI. All others negative     HPI:  Chelsea Flores is a 52 y.o. female here as requested by Creola Corn, MD for migraines. Started in college with vomiting, nausea, visual loss right lateral and dot on the left and has visual auras. She is well aware of stroke risk with migraine with aura. A big trigger is hormones. Caffeine is also a trigger. She is peri-menopausal, dehydration has been a trigger, taking too much advil has caused rebound and ulcers and now she does not take them, no medication overuse, in 2018 she would get 5-7 migraine in a week or two (not cluster headaches). She can get a dot in the center and slowly gets bigger and bigger when she has a visual aura. Her migraines are unilateral, pulsating/pounding/throbbing, movement makes it worse, unilateral, moderate to severe, can last upwards of 24-72 hours or longer untreated. Photophobia,phonophobia, daily headaches, she has a lot of neck tightness, she has pain where the muscles pop up when clenching. On average 15 mod to severe migraine days a month > 5 years. Every first degree relative has migraines. No changes in quality, not positional or exertional, no morning headaches, no vision changes. No other focal neurologic deficits, associated symptoms, inciting events or modifiable factors.   Reviewed notes, labs and imaging from outside physicians, which showed:  Tried: Gabapentin, Topiramate(hair loss, numbness and tingling and cannot perform surgery), tylenol, flexeril, decadron, lexapro, propranolol(asthmatic,contraindicated), amitriptyline/nortriptyline(sedation and weight gain), robaxin, nsaids, OTC analgesics, verapamil and other BP meds like carvedilol (contraindicated due to low blood pressure), imitrex, ubrelvy(helps), Aimovig contraindicated due to constipation, depakote.       REVIEW OF  SYSTEMS: Out of a complete 14 system review of symptoms, the patient complains only of the following symptoms, and all other reviewed systems are negative.  See HPI  ALLERGIES: Allergies  Allergen Reactions   Nsaids Other (See Comments)    HOME MEDICATIONS: Outpatient Medications Prior to Visit  Medication Sig Dispense Refill   albuterol (VENTOLIN HFA) 108 (90 Base) MCG/ACT inhaler Inhale 2 puffs into the lungs as needed.     eszopiclone (LUNESTA) 2 MG TABS tablet Lunesta 2 mg tablet  Take 1 tablet as needed by oral route at bedtime.     Galcanezumab-gnlm (EMGALITY) 120 MG/ML SOAJ Inject 120 mg into the skin every 30 (thirty) days. 1.12 mL 2   Pediatric Multiple Vitamins (FLINTSTONES MULTIVITAMIN PO) Take 1 tablet by mouth daily.     rizatriptan (MAXALT-MLT) 10 MG disintegrating tablet Take 1 tablet (10 mg total) by mouth as needed for migraine. May repeat in 2 hours if needed 9 tablet 11   spironolactone (ALDACTONE) 25 MG tablet Take 1 tablet po q daily Orally as directed for 90 days     Ubrogepant (UBRELVY) 100 MG TABS Take 1 tablet (100 mg total) by mouth as needed (take 1 tablet at onset of headache, may repeat 1 tablet if needed, max is 200 mg/day). 16 tablet 11   No facility-administered medications prior to visit.    PAST MEDICAL HISTORY: Past Medical History:  Diagnosis Date   Anxiety    Asthma    Back pain    Complication of anesthesia    Depression    Dyspnea    asthma flaring   Dysrhythmia    Headache    Insomnia    Palpitations    PONV (postoperative nausea and vomiting)    PVC (premature ventricular contraction)    Snores    Tenosynovitis of thumb    Vertigo     PAST SURGICAL HISTORY: Past Surgical History:  Procedure Laterality Date   CESAREAN SECTION     cist     removed from thumb   TUBAL LIGATION      FAMILY HISTORY: Family History  Problem Relation Age of Onset   Migraines Mother    Migraines Father    Migraines Brother    Migraines Son     Migraines Daughter     SOCIAL HISTORY: Social History   Socioeconomic History   Marital status: Married    Spouse name: brian   Number of children: 2   Years of education: Not on file   Highest education level: Doctorate  Occupational History   Not on file  Tobacco Use   Smoking status: Never   Smokeless tobacco: Never  Vaping Use   Vaping status: Never Used  Substance and Sexual Activity   Alcohol use: Yes    Alcohol/week: 2.0 standard drinks of alcohol    Types: 2 Glasses of wine per week    Comment: occas.   Drug use: Never   Sexual activity: Yes    Birth control/protection: None, Surgical  Other Topics Concern   Not on file  Social History Narrative   Not on file   Social Determinants of Health   Financial Resource Strain:  Not on file  Food Insecurity: Not on file  Transportation Needs: Not on file  Physical Activity: Not on file  Stress: Not on file  Social Connections: Not on file  Intimate Partner Violence: Not on file    Physical exam: Exam: Gen: NAD, conversant      CV: No palpitations or chest pain or SOB. VS: Breathing at a normal rate. Weight appears within normal limits. Not febrile. Eyes: Conjunctivae clear without exudates or hemorrhage  Neuro: Detailed Neurologic Exam  Speech:    Speech is normal; fluent and spontaneous with normal comprehension.  Cognition:    The patient is oriented to person, place, and time;     recent and remote memory intact;     language fluent;     normal attention, concentration, fund of knowledge Cranial Nerves:    The pupils are equal, round, and reactive to light. Visual fields are full Extraocular movements are intact.  The face is symmetric with normal sensation. The palate elevates in the midline. Hearing intact. Voice is normal. Shoulder shrug is normal. The tongue has normal motion without fasciculations.   Coordination: normal  Gait:    No abnormalities noted or reported  Motor Observation:    no involuntary movements noted. Tone:    Appears normal  Posture:    Posture is normal. normal erect    Strength:    Strength is anti-gravity and symmetric in the upper and lower limbs.      Sensation: intact to LT, no reports of numbness or tingling or paresthesias           DIAGNOSTIC DATA (LABS, IMAGING, TESTING) - I reviewed patient records, labs, notes, testing and imaging myself where available.  Lab Results  Component Value Date   WBC 4.4 04/01/2020   HGB 14.0 04/01/2020   HCT 41.4 04/01/2020   MCV 92.4 04/01/2020   PLT 222 04/01/2020   No results found for: "NA", "K", "CL", "CO2", "GLUCOSE", "BUN", "CREATININE", "CALCIUM", "PROT", "ALBUMIN", "AST", "ALT", "ALKPHOS", "BILITOT", "GFRNONAA", "GFRAA" No results found for: "CHOL", "HDL", "LDLCALC", "LDLDIRECT", "TRIG", "CHOLHDL" No results found for: "HGBA1C" No results found for: "VITAMINB12" No results found for: "TSH"   ASSESSMENT AND PLAN 52 y.o. year old female   04/05/2023: She did well on Botox(>80% of migraine and headache frequency and seveirty ) but had to try [employer] for insurance, inurance said: I submitted an auth to [employer] and received a denial back, stating that pt would need to try and fail three of the following drugs before Botox would be approved: Aimovig, Ajovy, Emgality, Qulipta, Dysport, Myobloc.  She still has daily headaches and > 15 migraine days a month will send in for botulinum approval try dysport. Per what insurance said to [place] above.   1.  Chronic migraine headaches (no medication overuse, no aura) -Had superb benefit with initial Botox injection in April 2024, but insurance denied subsequent injection due to requiring trial of further medication -Emgality for 3 months not of significant benefit still with chronic migraines -Continue Ubrelvy, Maxalt as needed for acute headache -Currently about daily headache days a month, >15 are migraines that can linger for several days and are moderate  to severe and can last up to 24 hours or longer, no aura, no medication overuse. Try to get Dysport for migraines.   -Previously tried and failed > 3 months: Ajovy, Aimovig, Emgality. Gabapentin, Topiramate(hair loss, numbness and tingling and cannot perform surgery), tylenol, flexeril, decadron, lexapro, propranolol(asthmatic,contraindicated), amitriptyline/nortriptyline(sedation and weight gain), robaxin,  nsaids, OTC analgesics, verapamil and other BP meds like carvedilol (contraindicated due to low blood pressure she runs 100-114 systolic), imitrex, ubrelvy(helps),  depakote. Botox helped the most she had one injection and >80% of migraine and headache frequency and seveirty  -Next Steps: restart Botulinum for chronic migraines, imitrex(did nothing), Rizatriptan, Nurtec did nothing, ubrelvy helps with maxalt and can take limited ibuprofen(or toradol) due to previous ulcer when overusing ibuprofen  Also sounds like essential tremor, no previous history of anti-dopamine drugs, no caffeine daily, discussed parkinson's disease, deep brain stimulation, we have no medications to alter the course except exercise, encouraged exercise, skin checks, and if she likes (dad has PD) we can can evaluate in the office and order DAT scan. She will monitor and we will see her in the office at next appointment.  Acutely uses ubrelvy and rizatriptan and brings it down to a 3/10. Add an ibuprofen(take with food for ulcer protection) she had an ulcer when taking ibuprofen 16 times a month and now would only need it a few times a month as long as < 6x a month I believe it would ok and take with food. Or try the toradol.   Patient complains of symptoms per HPI as well as the following symptoms: none . Pertinent negatives and positives per HPI. All others negative

## 2023-04-05 NOTE — Patient Instructions (Signed)
-Next Steps: restart Botulinum for chronic migraines, imitrex(did nothing), Rizatriptan, ubrelvy helpsand can take limited ibuprofen(or toradol) due to previous ulcer when using ibuprofen frequently try not to use more than 6 times a month.  OnabotulinumtoxinA Injection (Medical Use) What is this medication? ONABOTULINUMTOXINA (o na BOTT you lye num tox in eh) treats severe muscle spasms. It may also be used to prevent migraine headaches. It can treat excessive sweating when other medications do not work well enough. This medicine may be used for other purposes; ask your health care provider or pharmacist if you have questions. COMMON BRAND NAME(S): Botox What should I tell my care team before I take this medication? They need to know if you have any of these conditions: Breathing problems Cerebral palsy spasms Difficulty urinating Heart problems History of surgery where this medication is going to be used Infection at the site where this medication is going to be used Myasthenia gravis or other neurologic disease Nerve or muscle disease Surgery plans Take medications that treat or prevent blood clots Thyroid problems An unusual or allergic reaction to botulinum toxin, albumin, other medications, foods, dyes, or preservatives Pregnant or trying to get pregnant Breast-feeding How should I use this medication? This medication is for injected into a muscle. It is given by your care team in a hospital or clinic setting. A special MedGuide will be given to you before each treatment. Be sure to read this information carefully each time. Talk to your care team about the use of this medication in children. While this medication may be prescribed for children as young as 2 years for selected conditions, precautions do apply. Overdosage: If you think you have taken too much of this medicine contact a poison control center or emergency room at once. NOTE: This medicine is only for you. Do not share  this medicine with others. What if I miss a dose? This does not apply. What may interact with this medication? Aminoglycoside antibiotics, such as gentamicin, neomycin, tobramycin Muscle relaxants Other botulinum toxin injections This list may not describe all possible interactions. Give your health care provider a list of all the medicines, herbs, non-prescription drugs, or dietary supplements you use. Also tell them if you smoke, drink alcohol, or use illegal drugs. Some items may interact with your medicine. What should I watch for while using this medication? Visit your care team for regular check ups. This medication will cause weakness in the muscle where it is injected. Tell your care team if you feel unusually weak in other muscles. Get medical help right away if you have problems with breathing, swallowing, or talking. This medication might make your eyelids droop or make you see blurry or double. If you have weak muscles or trouble seeing do not drive a car, use machinery, or do other dangerous activities. This medication contains albumin from human blood. It may be possible to pass an infection in this medication, but no cases have been reported. Talk to your care team about the risks and benefits of this medication. If your activities have been limited by your condition, go back to your regular routine slowly after treatment with this medication. What side effects may I notice from receiving this medication? Side effects that you should report to your care team as soon as possible: Allergic reactions--skin rash, itching, hives, swelling of the face, lips, tongue, or throat Dryness or irritation of the eyes, eye pain, change in vision, sensitivity to light Infection--fever, chills, cough, sore throat, wounds that don't heal,  pain or trouble when passing urine, general feeling of discomfort or being unwell Spread of botulinum toxin effects--unusual weakness or fatigue, blurry or double  vision, trouble swallowing, hoarseness or trouble speaking, trouble breathing, loss of bladder control Trouble passing urine Side effects that usually do not require medical attention (report these to your care team if they continue or are bothersome): Dry mouth Eyelid drooping Fatigue Headache Pain, redness, or irritation at injection site This list may not describe all possible side effects. Call your doctor for medical advice about side effects. You may report side effects to FDA at 1-800-FDA-1088. Where should I keep my medication? This medication is given in a hospital or clinic and will not be stored at home. NOTE: This sheet is a summary. It may not cover all possible information. If you have questions about this medicine, talk to your doctor, pharmacist, or health care provider.  2024 Elsevier/Gold Standard (2021-04-23 00:00:00)

## 2023-04-11 NOTE — Telephone Encounter (Signed)
Scheduled for first available of 05/23/23 with Dr. Lucia Gaskins. Pt will be buy/bill.

## 2023-04-21 ENCOUNTER — Ambulatory Visit: Payer: BC Managed Care – PPO

## 2023-04-25 ENCOUNTER — Ambulatory Visit
Admission: RE | Admit: 2023-04-25 | Discharge: 2023-04-25 | Disposition: A | Discharge status: Home / Self Care | Payer: BC Managed Care – PPO | Source: Ambulatory Visit | Attending: Obstetrics and Gynecology | Admitting: Obstetrics and Gynecology

## 2023-04-25 DIAGNOSIS — Z1231 Encounter for screening mammogram for malignant neoplasm of breast: Secondary | ICD-10-CM

## 2023-05-17 DIAGNOSIS — Z6824 Body mass index (BMI) 24.0-24.9, adult: Secondary | ICD-10-CM | POA: Diagnosis not present

## 2023-05-17 DIAGNOSIS — Z800 Family history of malignant neoplasm of digestive organs: Secondary | ICD-10-CM | POA: Diagnosis not present

## 2023-05-17 DIAGNOSIS — Z124 Encounter for screening for malignant neoplasm of cervix: Secondary | ICD-10-CM | POA: Diagnosis not present

## 2023-05-17 DIAGNOSIS — Z01419 Encounter for gynecological examination (general) (routine) without abnormal findings: Secondary | ICD-10-CM | POA: Diagnosis not present

## 2023-05-17 DIAGNOSIS — R748 Abnormal levels of other serum enzymes: Secondary | ICD-10-CM | POA: Diagnosis not present

## 2023-05-17 DIAGNOSIS — Z860100 Personal history of colon polyps, unspecified: Secondary | ICD-10-CM | POA: Diagnosis not present

## 2023-05-23 ENCOUNTER — Ambulatory Visit (INDEPENDENT_AMBULATORY_CARE_PROVIDER_SITE_OTHER): Payer: BC Managed Care – PPO | Admitting: Neurology

## 2023-05-23 DIAGNOSIS — G43709 Chronic migraine without aura, not intractable, without status migrainosus: Secondary | ICD-10-CM | POA: Diagnosis not present

## 2023-05-23 MED ORDER — ABOBOTULINUMTOXINA 500 UNITS IM SOLR
500.0000 [IU] | Freq: Once | INTRAMUSCULAR | Status: AC
Start: 2023-05-23 — End: 2023-05-23
  Administered 2023-05-23: 443 [IU] via INTRAMUSCULAR

## 2023-05-23 NOTE — Progress Notes (Signed)
 Dysport- 500 units x 1 vial Lot: [id number] Expiration: 07/07/2024 NDC: [address]-1A  Bacteriostatic 0.9% Sodium Chloride- 3.5 mL  Lot: [id number] Expiration: 08/09/2023 NDC: 0347-4259-56  Dx: L87.564 B/B Witnessed by Truitt Leep RN

## 2023-05-23 NOTE — Progress Notes (Addendum)
 Consent Form Botulism Toxin Injection For Chronic Migraine   05/23/2023: First botulinum toxin. We diluted the 500units of dysport  with 3.5 ml so that every 0.3ml is approx 15units dysport  which is approx 5units botox  in order to replicate the migraine protocol with Botox  as best we could. Insurance would not approve Botox  but it did approve dysport  for migraine which is unusual. Will inject frontalis very high.  Reviewed orally with patient, additionally signature is on file:  Botulism toxin has been approved by the Federal drug administration for treatment of chronic migraine. Botulism toxin does not cure chronic migraine and it may not be effective in some patients.  The administration of botulism toxin is accomplished by injecting a small amount of toxin into the muscles of the neck and head. Dosage must be titrated for each individual. Any benefits resulting from botulism toxin tend to wear off after 3 months with a repeat injection required if benefit is to be maintained. Injections are usually done every 3-4 months with maximum effect peak achieved by about 2 or 3 weeks. Botulism toxin is expensive and you should be sure of what costs you will incur resulting from the injection.  The side effects of botulism toxin use for chronic migraine may include:   -Transient, and usually mild, facial weakness with facial injections  -Transient, and usually mild, head or neck weakness with head/neck injections  -Reduction or loss of forehead facial animation due to forehead muscle weakness  -Eyelid drooping  -Dry eye  -Pain at the site of injection or bruising at the site of injection  -Double vision  -Potential unknown long term risks  Contraindications: You should not have Botox  if you are pregnant, nursing, allergic to albumin, have an infection, skin condition, or muscle weakness at the site of the injection, or have myasthenia gravis, Lambert-Eaton syndrome, or ALS.  It is also possible that  as with any injection, there may be an allergic reaction or no effect from the medication. Reduced effectiveness after repeated injections is sometimes seen and rarely infection at the injection site may occur. All care will be taken to prevent these side effects. If therapy is given over a long time, atrophy and wasting in the muscle injected may occur. Occasionally the patient's become refractory to treatment because they develop antibodies to the toxin. In this event, therapy needs to be modified.  I have read the above information and consent to the administration of botulism toxin.    BOTOX  PROCEDURE NOTE FOR MIGRAINE HEADACHE    Contraindications and precautions discussed with patient(above). Aseptic procedure was observed and patient tolerated procedure. Procedure performed by Dr. Andree Epp  The condition has existed for more than 6 months, and pt does not have a diagnosis of ALS, Myasthenia Gravis or Lambert-Eaton Syndrome.  Risks and benefits of injections discussed and pt agrees to proceed with the procedure.  Written consent obtained  These injections are medically necessary. Pt  receives good benefits from these injections. These injections do not cause sedations or hallucinations which the oral therapies may cause.  Description of procedure:  The patient was placed in a sitting position. The standard protocol was used for Botox  as follows, with 5 units of Botox  injected at each site:   -Procerus muscle, midline injection  -Corrugator muscle, bilateral injection  -Frontalis muscle, bilateral injection, with 2 sites each side, medial injection was performed in the upper one third of the frontalis muscle, in the region vertical from the medial inferior edge of the  superior orbital rim. The lateral injection was again in the upper one third of the forehead vertically above the lateral limbus of the cornea, 1.5 cm lateral to the medial injection site.  -Temporalis muscle  injection, 4 sites, bilaterally. The first injection was 3 cm above the tragus of the ear, second injection site was 1.5 cm to 3 cm up from the first injection site in line with the tragus of the ear. The third injection site was 1.5-3 cm forward between the first 2 injection sites. The fourth injection site was 1.5 cm posterior to the second injection site.   -Occipitalis muscle injection, 3 sites, bilaterally. The first injection was done one half way between the occipital protuberance and the tip of the mastoid process behind the ear. The second injection site was done lateral and superior to the first, 1 fingerbreadth from the first injection. The third injection site was 1 fingerbreadth superiorly and medially from the first injection site.  -Cervical paraspinal muscle injection, 2 sites, bilateral knee first injection site was 1 cm from the midline of the cervical spine, 3 cm inferior to the lower border of the occipital protuberance. The second injection site was 1.5 cm superiorly and laterally to the first injection site.  -Trapezius muscle injection was performed at 3 sites, bilaterally. The first injection site was in the upper trapezius muscle halfway between the inflection point of the neck, and the acromion. The second injection site was one half way between the acromion and the first injection site. The third injection was done between the first injection site and the inflection point of the neck.   Will return for repeat injection in 3 months.   500 units of Dysport  was used. The patient tolerated the procedure well, there were no complications of the above procedure.

## 2023-05-26 MED ORDER — ABOBOTULINUMTOXINA 500 UNITS IM SOLR
500.0000 [IU] | Freq: Once | INTRAMUSCULAR | Status: AC
Start: 2023-05-26 — End: 2023-05-23
  Administered 2023-05-23: 500 [IU] via INTRAMUSCULAR

## 2023-05-26 NOTE — Addendum Note (Signed)
Addended by: Bertram Savin on: 05/26/2023 11:54 AM   Modules accepted: Orders

## 2023-06-06 ENCOUNTER — Telehealth: Payer: Self-pay

## 2023-06-06 NOTE — Telephone Encounter (Signed)
Patient is aware of scheduled appointment times/dates/location. Patient has also been advised to show up at least 15 mins prior for registration

## 2023-06-14 DIAGNOSIS — R051 Acute cough: Secondary | ICD-10-CM | POA: Diagnosis not present

## 2023-06-14 DIAGNOSIS — J101 Influenza due to other identified influenza virus with other respiratory manifestations: Secondary | ICD-10-CM | POA: Diagnosis not present

## 2023-07-02 ENCOUNTER — Telehealth: Payer: Self-pay

## 2023-07-02 NOTE — Telephone Encounter (Signed)
 Rescheduled patient's appointments and informed patient with detailed updates. Also sent a reminder.

## 2023-07-11 ENCOUNTER — Telehealth: Payer: Self-pay | Admitting: Pharmacist

## 2023-07-11 ENCOUNTER — Other Ambulatory Visit: Payer: Self-pay | Admitting: Neurology

## 2023-07-11 ENCOUNTER — Other Ambulatory Visit: Payer: BC Managed Care – PPO

## 2023-07-11 ENCOUNTER — Encounter: Payer: Self-pay | Admitting: Neurology

## 2023-07-11 DIAGNOSIS — M25532 Pain in left wrist: Secondary | ICD-10-CM | POA: Diagnosis not present

## 2023-07-11 DIAGNOSIS — M778 Other enthesopathies, not elsewhere classified: Secondary | ICD-10-CM | POA: Diagnosis not present

## 2023-07-11 NOTE — Telephone Encounter (Signed)
 Pharmacy Patient Advocate Encounter   Received notification from Patient Pharmacy that prior authorization for Ubrelvy 100MG  tablets is required/requested.   Insurance verification completed.   The patient is insured through  [employer] PA Form  .   Per test claim: PA required; PA submitted to above mentioned insurance via CoverMyMeds Key/confirmation #/EOC [id number] Status is pending

## 2023-07-12 ENCOUNTER — Other Ambulatory Visit: Payer: Self-pay

## 2023-07-12 ENCOUNTER — Encounter: Payer: Self-pay | Admitting: Physical Therapy

## 2023-07-12 ENCOUNTER — Ambulatory Visit: Payer: BC Managed Care – PPO | Attending: Obstetrics and Gynecology | Admitting: Physical Therapy

## 2023-07-12 DIAGNOSIS — M6281 Muscle weakness (generalized): Secondary | ICD-10-CM | POA: Diagnosis not present

## 2023-07-12 DIAGNOSIS — R279 Unspecified lack of coordination: Secondary | ICD-10-CM | POA: Diagnosis not present

## 2023-07-12 NOTE — Therapy (Signed)
 OUTPATIENT PHYSICAL THERAPY FEMALE PELVIC EVALUATION   Patient Name: Chelsea Flores MRN: [id number] DOB:May 26, 1970, 53 y.o., female Today's Date: 07/12/2023  END OF SESSION:  PT End of Session - 07/12/23 1333     Visit Number 1    Number of Visits 8    Date for PT Re-Evaluation 08/09/23    Authorization Type BCBS    PT Start Time 1015    PT Stop Time 1100    PT Time Calculation (min) 45 min    Activity Tolerance Patient tolerated treatment well    Behavior During Therapy WFL for tasks assessed/performed             Past Medical History:  Diagnosis Date   Anxiety    Asthma    Back pain    Complication of anesthesia    Depression    Dyspnea    asthma flaring   Dysrhythmia    Headache    Insomnia    Palpitations    PONV (postoperative nausea and vomiting)    PVC (premature ventricular contraction)    Snores    Tenosynovitis of thumb    Vertigo    Past Surgical History:  Procedure Laterality Date   CESAREAN SECTION     cist     removed from thumb   TUBAL LIGATION     Patient Active Problem List   Diagnosis Date Noted   Tremor 04/05/2023   Chronic migraine without aura without status migrainosus, not intractable 11/30/2021   Left foot pain 01/09/2016   Hip pain 04/13/2012    PCP: Creola Corn, MD  REFERRING PROVIDER: Harold Hedge, MD   REFERRING DIAG: [phone] (ICD-10-CM) - Incontinence overflow, stress female  THERAPY DIAG:  Muscle weakness (generalized)  Unspecified lack of coordination  Rationale for Evaluation and Treatment: Rehabilitation  ONSET DATE: 2018  SUBJECTIVE:                                                                                                                                                                                           SUBJECTIVE STATEMENT: Patient reports to PT with urinary incontinence "at times" - When training for a half marathon in 2018 she began noticing incontinence issues which has  continued to progress. For example, recently patient had the flu and was coughing a lot during this time, therefore she experienced more leakage than normal. Patient is able to run a few miles before leakage begins while running. Fluid intake: earl grey tea in the mornings, 1-1.5 liters of water per day; when migraines are not present, 2+ liters; flavored club soda in the evenings occasionally   PAIN:  Are you having pain? No NPRS scale: 0/10  PRECAUTIONS: None  RED FLAGS: None   WEIGHT BEARING RESTRICTIONS: No  FALLS:  Has patient fallen in last 6 months? 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   ACTIVITY LEVEL : not running currently due to leakage, wishes to do another half marathon; youtube weightlifting videos   PLOF: Independent  PATIENT GOALS: return back to running without leakage, to do exercises independently   PERTINENT HISTORY:  2 c-section, gallbladder removal  Sexual abuse: No  BOWEL MOVEMENT: Pain with bowel movement: No Type of bowel movement:Type (Bristol Stool Scale) 2, Frequency 1x/day usually, Strain no, and Splinting yes on occasion Fully empty rectum: Yes:   Leakage: No Pads: No Fiber supplement/laxative No  URINATION: Pain with urination: No Fully empty bladder: Yes:   Stream: Strong Urgency: Yes  Frequency: within normal limits Leakage: Coughing and Sneezing Pads: No  INTERCOURSE: when husband is in town, yes   Ability to have vaginal penetration Yes  Pain with intercourse: Initial Penetration, During Penetration, Deep Penetration, and After Intercourse DrynessNo Climax: yes, no pain  Marinoff Scale: 0/3 No lubricant  PREGNANCY: Vaginal deliveries 0 C-section deliveries 2 (2000, 2003) Currently pregnant No  PROLAPSE: None  OBJECTIVE:  Note: Objective measures were completed at Evaluation unless otherwise noted.  PATIENT SURVEYS:  PFIQ-7: 10  COGNITION: Overall cognitive status: Within functional limits for tasks  assessed     SENSATION: Light touch: Appears intact  LUMBAR SPECIAL TESTS:  Single leg stance test: Positive  FUNCTIONAL TESTS:  Squat:no pain, sufficient lumbopelvic mobility    GAIT: Assistive device utilized: None Comments: mild trendelenburg gait pattern with ambulation   POSTURE: rounded shoulders and forward head  LUMBARAROM/PROM: within normal limits   A/PROM A/PROM  eval  Flexion   Extension   Right lateral flexion   Left lateral flexion   Right rotation   Left rotation    (Blank rows = not tested)  LOWER EXTREMITY ROM: within normal limits   Active ROM Right eval Left eval  Hip flexion    Hip extension    Hip abduction    Hip adduction    Hip internal rotation    Hip external rotation    Knee flexion    Knee extension    Ankle dorsiflexion    Ankle plantarflexion    Ankle inversion    Ankle eversion     (Blank rows = not tested)  LOWER EXTREMITY MMT: 4+/5 bilateral hips and knees grossly   MMT Right eval Left eval  Hip flexion    Hip extension    Hip abduction    Hip adduction    Hip internal rotation    Hip external rotation    Knee flexion    Knee extension    Ankle dorsiflexion    Ankle plantarflexion    Ankle inversion    Ankle eversion     (Blank rows = not tested) PALPATION:   General: no significant tension in bilateral adductors/hip flexors   Pelvic Alignment: within normal limits   Abdominal: abdominal bracing at rest, upper chest breathing, decreased rib excursion with inhalation                 External Perineal Exam: minimal dryness noted, no pain with palpation of superficial musculature, sufficient clitoral hood mobility  Internal Pelvic Floor: no significant muscle tension noted; no pain with examination of pelvic floor musculature layers 1/2/3; general lack of coordination between pelvic floor and diaphragm   Patient confirms identification and approves PT to assess internal pelvic floor  and treatment Yes No emotional/communication barriers or cognitive limitation. Patient is motivated to learn. Patient understands and agrees with treatment goals and plan. PT explains patient will be examined in standing, sitting, and lying down to see how their muscles and joints work. When they are ready, they will be asked to remove their underwear so PT can examine their perineum. The patient is also given the option of providing their own chaperone as one is not provided in our facility. The patient also has the right and is explained the right to defer or refuse any part of the evaluation or treatment including the internal exam. With the patient's consent, PT will use one gloved finger to gently assess the muscles of the pelvic floor, seeing how well it contracts and relaxes and if there is muscle symmetry. After, the patient will get dressed and PT and patient will discuss exam findings and plan of care. PT and patient discuss plan of care, schedule, attendance policy and HEP activities.  PELVIC MMT:   MMT eval  Vaginal 3/5, 3 sec hold, 10 quick flicks   Internal Anal Sphincter   External Anal Sphincter   Puborectalis   Diastasis Recti   (Blank rows = not tested)  TONE: Within normal limits   PROLAPSE: N/A   TODAY'S TREATMENT:                                                                                                                              DATE:   EVAL 07/12/23: Examination completed, findings reviewed, pt educated on POC, HEP, and self care. Pt motivated to participate in PT and agreeable to attempt recommendations.  Neuro re-ed: Hooklying diaphragmatic breathing + pelvic floor musculature range of motion with inhalation/exhalation 3x10 breaths  Hook[place] pelvic floor quick flicks + diaphragmatic breathing 2x10  Self care: Relative anatomy, connection between the pelvic floor and the diaphragm, pelvic floor active range of motion, vaginal moisturizers   PATIENT EDUCATION:   Education details: Relative anatomy, connection between the pelvic floor and the diaphragm, pelvic floor active range of motion, vaginal moisturizers  Person educated: Patient Education method: Explanation, Demonstration, Tactile cues, Verbal cues, and Handouts Education comprehension: verbalized understanding, returned demonstration, verbal cues required, and tactile cues required  HOME EXERCISE PROGRAM: Access Code: [email or web address] URL: [email or web address] Date: 07/12/2023 Prepared by: Earna Coder  Exercises - Supine Pelvic Floor Contraction  - 1 x daily - 7 x weekly - 3 sets - 10 reps - Quick Flick Pelvic Floor Contractions in Hooklying  - 1 x daily - 7 x weekly - 3 sets - 10 reps  ASSESSMENT:  CLINICAL IMPRESSION: Patient is a 53 y.o. female  who was seen today for physical therapy evaluation and treatment  for stress incontinence. Patient has been experiencing urinary incontinence since 2018 when she started training for a half marathon. Leakage has gotten progressively worse since then, happening when she runs and with coughing/sneezing. Patient demonstrates sufficient lumbopelvic range of motion/mobility, and she demonstrates pelvic floor weakness/lack of coordination in the pelvic floor with the diaphragm. No pain during or after today's internal examination. Pt would benefit from additional PT to further address deficits.    OBJECTIVE IMPAIRMENTS: decreased coordination, decreased endurance, decreased mobility, decreased ROM, and decreased strength.   ACTIVITY LIMITATIONS: continence  PARTICIPATION LIMITATIONS:  running for long distances  PERSONAL FACTORS: Past/current experiences and Time since onset of injury/illness/exacerbation are also affecting patient's functional outcome.   REHAB POTENTIAL: Good  CLINICAL DECISION MAKING: Stable/uncomplicated  EVALUATION COMPLEXITY: Low   GOALS: Goals reviewed with patient? Yes  SHORT TERM GOALS: Target date:  08/09/2023  Pt will be independent with HEP.  Baseline: Goal status: INITIAL  2.  Pt will be independent with diaphragmatic breathing and down training activities in order to improve pelvic floor relaxation.  Baseline:  Goal status: INITIAL  3.  Pt will be independent with the knack, urge suppression technique, and double voiding in order to improve bladder habits and decrease urinary incontinence.   Baseline:  Goal status: INITIAL  LONG TERM GOALS: Target date: 01/12/2024  Pt will be independent with advanced HEP.  Baseline:  Goal status: INITIAL  2.  Pt to demonstrate improved coordination of pelvic floor and breathing mechanics with 10# squat with appropriate synergistic patterns to decrease pain and leakage at least 75% of the time.    Baseline:  Goal status: INITIAL  3.  Pt to demonstrate at least 4+/5 pelvic floor strength for improved pelvic stability and decreased strain at pelvic floor/ decrease leakage.   Baseline:  Goal status: INITIAL  4.  Pt will be able to run/workout for 30 minutes without leakage or discomfort to improve quality of life and stress urinary incontinence. Baseline:  Goal status: INITIAL  PLAN:  PT FREQUENCY: 1x/week  PT DURATION: 8 weeks  PLANNED INTERVENTIONS: 97110-Therapeutic exercises, 97530- Therapeutic activity, 97112- Neuromuscular re-education, 97535- Self Care, 16109- Manual therapy, Taping, Dry Needling, Joint mobilization, Spinal mobilization, Scar mobilization, Cryotherapy, and Moist heat  PLAN FOR NEXT SESSION: continued pelvic floor AROM training to seated/standing, introduce hip strengthening and progressive pelvic floor training with emphasis on diaphragmatic breathing    Omar Person, PT 07/12/2023, 1:34 PM

## 2023-07-13 NOTE — Progress Notes (Deleted)
 [facility] CONSULT NOTE  Patient Care Team: Creola Corn, MD as PCP - General (Internal Medicine)   ASSESSMENT & PLAN:  53 y.o.female with history of *** referred to [facility] for history of ***.    First episode: Second episode: Third episode: Setting: Treatment:  Patient education for risk factors and prevention of clotting We talked about modifiable risk factors.  Prevention of clotting like deep vein thrombosis including: Strong risk factors including fractures of lower limb, hospitalization for severe illness, such as heart failure, myocardial infarction, spinal cord injury, major trauma, hip or knee replacement, and previous VTE. avoid prolonged immobilization and moving extremities every 1-2 hours during long car rides or flights.  Taking a break and moving extremities if working in a job setting with prolonged sitting.   Avoid dehydration, especially in high altitude. Avoid cigarette smoking Avoid use of hormone replacement therapy, estrogen-containing contraceptive in women.   Maintaining healthy lifestyle to prevent development of diabetes.  Weight loss if BMI over 30.  Regular exercises but not extreme.   Other risk factors for clotting are surgery, hospitalization, inflammatory disease or severe infection, trauma or injuries from inflammatory state and stasis. If developing one-sided leg swelling, pain, color change, chest pain, sudden short of breath, difficulty taking deep breaths, taking deep breath with chest discomfort or pain, dizziness or heart racing sensation, go to the emergency room immediately for evaluation. If developing trauma, uncontrolled bleeding, such as bloody stools report ED immediately. Avoid NSAIDs, aspirin while on blood thinner.  Patient should avoid elective surgery in the acute thrombosis period for 3 months.  Discussed bleeding precautions and avoid high risk activities for falling while on  anticoagulation. Anticoagulants do not cause bleeding.  Rather, if bleeding occurs when one is on anticoagulation, it may take longer for the bleeding to stop due to reduce coagulation capacity.    I reviewed with the patient about the plan for care for recurrent ***DVT/PE.   ***I have not made a return appointment for the patient to come back. I would be happy to assist in perioperative DVT management in the future should she need any interruption of her anticoagulation therapy for elective procedures.  No orders of the defined types were placed in this encounter.   All questions were answered. The patient knows to call the clinic with any problems, questions or concerns. The total time spent in the appointment was {CHL ONC TIME VISIT - ZOXWR:[id number]} encounter with patients including review of chart and various tests results, discussions about plan of care and coordination of care plan  Melven Sartorius, MD 3/5/202510:42 PM  CHIEF COMPLAINTS/PURPOSE OF CONSULTATION:  ***  HISTORY OF PRESENTING ILLNESS:  Leanord Hawking 53 y.o. female is here because of history of DVT.  There was no detailed history regarding patient's thrombosis history from outside records.  05/09/15 [place] LLE: No evidence of deep venous thrombosis in the left lower extremity.   ***She denies ***lower extremity swelling, warmth, tenderness & erythema.  She denies recent chest pain on exertion, shortness of breath on minimal exertion, pre-syncopal episodes, hemoptysis, or palpitation. ***She denies recent *** history of trauma, *** long distance travel, dehydration, recent surgery, ***smoking or prolonged immobilization. ***She had no prior history or diagnosis of cancer. Her age appropriate screening programs are up-to-date. ***She had prior surgeries before and never had perioperative thromboembolic events. ***The patient had been exposed to birth control pills *** hormone replacement therapy *** testosterone  replacement therapy and  never had thrombotic events. ***The patient had been pregnant before and denies history of peripartum thromboembolic event or history of recurrent miscarriages. ***There is no family history of blood clots or miscarriages.  MEDICAL HISTORY:  Past Medical History:  Diagnosis Date   Anxiety    Asthma    Back pain    Complication of anesthesia    Depression    Dyspnea    asthma flaring   Dysrhythmia    Headache    Insomnia    Palpitations    PONV (postoperative nausea and vomiting)    PVC (premature ventricular contraction)    Snores    Tenosynovitis of thumb    Vertigo     SURGICAL HISTORY: Past Surgical History:  Procedure Laterality Date   CESAREAN SECTION     cist     removed from thumb   TUBAL LIGATION      SOCIAL HISTORY: Social History   Socioeconomic History   Marital status: Married    Spouse name: brian   Number of children: 2   Years of education: Not on file   Highest education level: Doctorate  Occupational History   Not on file  Tobacco Use   Smoking status: Never   Smokeless tobacco: Never  Vaping Use   Vaping status: Never Used  Substance and Sexual Activity   Alcohol use: Yes    Alcohol/week: 2.0 standard drinks of alcohol    Types: 2 Glasses of wine per week    Comment: occas.   Drug use: Never   Sexual activity: Yes    Birth control/protection: None, Surgical  Other Topics Concern   Not on file  Social History Narrative   Not on file   Social Drivers of Health   Financial Resource Strain: Not on file  Food Insecurity: Low Risk  (07/11/2023)   Received from [facility]   Hunger Vital Sign    Worried About Running Out of Food in the Last Year: Never true    Ran Out of Food in the Last Year: Never true  Transportation Needs: No Transportation Needs (07/11/2023)   Received from [facility]    In the past 12 months, has lack of reliable transportation kept you from medical appointments,  meetings, work or from getting things needed for daily living? : No  Physical Activity: Not on file  Stress: Not on file  Social Connections: Not on file  Intimate Partner Violence: Not on file    FAMILY HISTORY: Family History  Problem Relation Age of Onset   Migraines Mother    Migraines Father    Migraines Brother    Migraines Son    Migraines Daughter     ALLERGIES:  is allergic to nsaids.  MEDICATIONS:  Current Outpatient Medications  Medication Sig Dispense Refill   albuterol (VENTOLIN HFA) 108 (90 Base) MCG/ACT inhaler Inhale 2 puffs into the lungs as needed.     eszopiclone (LUNESTA) 2 MG TABS tablet Lunesta 2 mg tablet  Take 1 tablet as needed by oral route at bedtime.     Galcanezumab-gnlm (EMGALITY) 120 MG/ML SOAJ Inject 120 mg into the skin every 30 (thirty) days. (Patient not taking: Reported on 05/23/2023) 1.12 mL 2   Pediatric Multiple Vitamins (FLINTSTONES MULTIVITAMIN PO) Take 1 tablet by mouth daily.     rizatriptan (MAXALT-MLT) 10 MG disintegrating tablet Take 1 tablet (10 mg total) by mouth as needed for migraine. May repeat in 2 hours if needed 9 tablet 11  spironolactone (ALDACTONE) 25 MG tablet Take 1 tablet po q daily Orally as directed for 90 days (Patient not taking: Reported on 05/23/2023)     UBRELVY 100 MG TABS TAKE 1 TABLET BY MOUTH AS NEEDED AT ONSET OF HEADACHE, MAY REPEAT 1 TABLET OF NEEDED. MAX: 2 TABLETS/DAY 16 tablet 11   No current facility-administered medications for this visit.    REVIEW OF SYSTEMS:   All relevant systems were reviewed with the patient and are negative.  PHYSICAL EXAMINATION: ECOG PERFORMANCE STATUS: {CHL ONC ECOG PS:[id number]}  There were no vitals filed for this visit. There were no vitals filed for this visit.  GENERAL: alert, no distress and comfortable SKIN: skin color normal LUNGS: Effort normal and no respiratory distress.  Clear to auscultation HEART: regular rate & rhythm ABDOMEN: abdomen soft,  non-tender Musculoskeletal: no cyanosis and edema.  Right lower extremity *** left lower extremity ***  LABORATORY DATA:  I have reviewed the data as listed   RADIOGRAPHIC STUDIES: I have personally reviewed the radiological images as listed and agreed with the findings in the report. No results found.

## 2023-07-14 NOTE — Telephone Encounter (Signed)
 Pharmacy Patient Advocate Encounter  Received notification from [employer] PA Form  that Prior Authorization for Ubrelvy 100MG  tablets  has been APPROVED from 07/13/2023 to 07/13/2024   PA #/Case ID/Reference #: [id number]

## 2023-07-15 ENCOUNTER — Inpatient Hospital Stay: Payer: BC Managed Care – PPO

## 2023-07-19 ENCOUNTER — Telehealth: Payer: Self-pay | Admitting: Oncology

## 2023-07-19 NOTE — Telephone Encounter (Signed)
 Completed - Patient re-scheduled appointments. Patient is aware of all appointment details.

## 2023-08-02 DIAGNOSIS — G4700 Insomnia, unspecified: Secondary | ICD-10-CM | POA: Diagnosis not present

## 2023-08-15 ENCOUNTER — Ambulatory Visit: Payer: BC Managed Care – PPO | Admitting: Neurology

## 2023-08-17 ENCOUNTER — Other Ambulatory Visit (HOSPITAL_COMMUNITY): Payer: Self-pay

## 2023-08-18 ENCOUNTER — Inpatient Hospital Stay

## 2023-08-18 ENCOUNTER — Other Ambulatory Visit: Payer: Self-pay

## 2023-08-18 ENCOUNTER — Inpatient Hospital Stay: Attending: Oncology | Admitting: Oncology

## 2023-08-18 ENCOUNTER — Encounter: Payer: Self-pay | Admitting: Oncology

## 2023-08-18 VITALS — BP 114/82 | HR 87 | Temp 97.8°F | Resp 17 | Ht 68.0 in | Wt 157.3 lb

## 2023-08-18 DIAGNOSIS — G43909 Migraine, unspecified, not intractable, without status migrainosus: Secondary | ICD-10-CM | POA: Diagnosis not present

## 2023-08-18 DIAGNOSIS — D72819 Decreased white blood cell count, unspecified: Secondary | ICD-10-CM | POA: Insufficient documentation

## 2023-08-18 DIAGNOSIS — Z8249 Family history of ischemic heart disease and other diseases of the circulatory system: Secondary | ICD-10-CM | POA: Diagnosis not present

## 2023-08-18 DIAGNOSIS — J45909 Unspecified asthma, uncomplicated: Secondary | ICD-10-CM | POA: Insufficient documentation

## 2023-08-18 DIAGNOSIS — Z832 Family history of diseases of the blood and blood-forming organs and certain disorders involving the immune mechanism: Secondary | ICD-10-CM | POA: Insufficient documentation

## 2023-08-18 DIAGNOSIS — Z1379 Encounter for other screening for genetic and chromosomal anomalies: Secondary | ICD-10-CM | POA: Diagnosis not present

## 2023-08-18 DIAGNOSIS — Z79899 Other long term (current) drug therapy: Secondary | ICD-10-CM | POA: Diagnosis not present

## 2023-08-18 LAB — CBC WITH DIFFERENTIAL (CANCER CENTER ONLY)
Abs Immature Granulocytes: 0.01 10*3/uL (ref 0.00–0.07)
Basophils Absolute: 0 10*3/uL (ref 0.0–0.1)
Basophils Relative: 1 %
Eosinophils Absolute: 0.1 10*3/uL (ref 0.0–0.5)
Eosinophils Relative: 2 %
HCT: 42 % (ref 36.0–46.0)
Hemoglobin: 14.7 g/dL (ref 12.0–15.0)
Immature Granulocytes: 0 %
Lymphocytes Relative: 35 %
Lymphs Abs: 1.4 10*3/uL (ref 0.7–4.0)
MCH: 30.4 pg (ref 26.0–34.0)
MCHC: 35 g/dL (ref 30.0–36.0)
MCV: 87 fL (ref 80.0–100.0)
Monocytes Absolute: 0.4 10*3/uL (ref 0.1–1.0)
Monocytes Relative: 9 %
Neutro Abs: 2 10*3/uL (ref 1.7–7.7)
Neutrophils Relative %: 53 %
Platelet Count: 217 10*3/uL (ref 150–400)
RBC: 4.83 MIL/uL (ref 3.87–5.11)
RDW: 12.1 % (ref 11.5–15.5)
WBC Count: 3.8 10*3/uL — ABNORMAL LOW (ref 4.0–10.5)
nRBC: 0 % (ref 0.0–0.2)

## 2023-08-18 LAB — CMP (CANCER CENTER ONLY)
ALT: 70 U/L — ABNORMAL HIGH (ref 0–44)
AST: 53 U/L — ABNORMAL HIGH (ref 15–41)
Albumin: 4.7 g/dL (ref 3.5–5.0)
Alkaline Phosphatase: 110 U/L (ref 38–126)
Anion gap: 6 (ref 5–15)
BUN: 14 mg/dL (ref 6–20)
CO2: 29 mmol/L (ref 22–32)
Calcium: 9.5 mg/dL (ref 8.9–10.3)
Chloride: 107 mmol/L (ref 98–111)
Creatinine: 0.96 mg/dL (ref 0.44–1.00)
GFR, Estimated: >60 mL/min (ref >60)
Glucose, Bld: 94 mg/dL (ref 70–99)
Potassium: 4.1 mmol/L (ref 3.5–5.1)
Sodium: 142 mmol/L (ref 135–145)
Total Bilirubin: 0.7 mg/dL (ref 0.0–1.2)
Total Protein: 7.2 g/dL (ref 6.5–8.1)

## 2023-08-18 LAB — D-DIMER, QUANTITATIVE: D-Dimer, Quant: 0.28 ug{FEU}/mL (ref 0.00–0.50)

## 2023-08-18 NOTE — Progress Notes (Signed)
 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 Assessment & Plan Family history of thrombophilia Significant family history of thrombophilia with multiple family members, including her father, mother, and oldest brother, on lifelong anticoagulation therapy with Xarelto due to DVT and PE. She has not experienced thrombotic events but is concerned about her risk. Genetic testing for Factor V Leiden was negative in her brother, Chelsea Flores, who has had multiple thrombotic events.   She seeks evaluation for potential genetic or acquired thrombosis predispositions. Discussed life-threatening risks of PE and importance of identifying underlying conditions.   Routine anticoagulation not recommended without documented clot. Discussed prophylactic use of Eliquis before long flights or road trips due to family history, despite lack of specific supporting studies. Advised against aspirin due to history of stomach ulcers.  - Order genetic testing for prothrombin gene mutation, factor V Leiden mutation, protein C activity, protein S activity, antithrombin III, anti-cardiolipin antibody, lupus anticoagulant and glycoprotein antibodies. - D-dimer today is almost undetectable at 0.28.  CBCD grossly unremarkable except for mild leukopenia at 3800 with  normal differential.  No clinical concern for ongoing thromboembolism.  - Advise against routine anticoagulation unless a clot is documented. - Recommend prophylactic use of Eliquis before long flights or road trips.  - Advise against aspirin due to history of stomach ulcers.  Asthma Asthma since age 68, no acute exacerbations or issues discussed during this visit.  Migraine headaches Under care of Dr. Lucia Flores for migraine headaches. Uses medication similar to Botox for prevention and takes rizatriptan and Ubrelvy for breakthrough migraines.  Follow-up Up to date on screenings, including mammogram, colonoscopy, and Pap smear. Under care of Dr. Timothy [facility] for primary care and Chelsea Flores for gynecological care. Test results will be communicated via MyChart, with follow-up appointment if results are abnormal.  - Communicate test results via MyChart. If results are abnormal, schedule a follow-up appointment.   I reviewed lab results and outside records for this visit and discussed relevant results with the patient. Diagnosis, plan of care and treatment options were also discussed in detail with the patient. Opportunity provided to ask questions and answers provided to her apparent satisfaction. Provided instructions to call our clinic with any problems, questions or concerns prior to return visit. I recommended to continue follow-up with PCP and sub-specialists. She verbalized understanding and agreed with the plan. No barriers to learning was detected.  Chelsea Crutch, Flores  08/18/2023 4:10 PM   [facility] - A DEPT OF [facility] [address] [place] [address] Dept: [phone] Dept Fax: [phone]   HISTORY OF PRESENT ILLNESS:  Discussed the use of AI scribe software for clinical note transcription with the patient, who gave verbal consent to proceed.   History of Present Illness Chelsea Flores is a 53 year old  female who presents for evaluation  of clotting risk. She was referred by Chelsea Flores for evaluation of clotting risk due to family history.  She has a significant family history of thromboembolic events. Her father has been hospitalized for pulmonary embolism and is on lifelong Xarelto. Her mother experienced pulmonary embolism while on prednisone and is also on lifelong anticoagulation. Her oldest brother has had three episodes of pulmonary embolism, the most recent involving a DVT that progressed to a pulmonary embolism. Her middle brother experienced a superficial clot after a long flight. She is the only family member who has not experienced a clotting event.  She has a history of elevated liver function tests and underwent cholecystectomy during the COVID-19 pandemic, where she was given prophylactic anticoagulation post-surgery due to her family history of clotting. She has asthma since age 75 and experiences migraine headaches, for which she uses a medication similar to Botox, rizatriptan, and Ubrelvy for breakthrough pain. She previously took spironolactone for hair thinning but has discontinued it. She also has a history of stomach ulcers, which limits her use of aspirin.  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 She travels [place] occasionally and drives long distances to a house in [place].  No trouble swallowing, no current pain in legs, no recent clotting events.   MEDICAL HISTORY Past Medical History:  Diagnosis Date   Anxiety    Asthma    Back pain    Complication of anesthesia    Depression    Dyspnea    asthma flaring   Dysrhythmia    Headache    Insomnia    Palpitations    PONV (postoperative nausea and vomiting)    PVC (premature ventricular contraction)    Snores    Tenosynovitis of thumb    Vertigo      SURGICAL HISTORY Past Surgical History:  Procedure Laterality Date   CESAREAN SECTION     cist     removed  from thumb   TUBAL LIGATION       SOCIAL HISTORY: She reports that she has never smoked. She has never used smokeless tobacco. She reports current alcohol use of about 2.0 standard drinks of alcohol per week. She reports that she does not use drugs. Social History   Socioeconomic History   Marital status: Married    Spouse name: brian   Number of children: 2   Years of education: Not on file   Highest education level: Doctorate  Occupational History   Not on file  Tobacco Use   Smoking status: Never   Smokeless tobacco: Never  Vaping Use   Vaping status: Never Used  Substance and Sexual Activity   Alcohol use: Yes    Alcohol/week: 2.0 standard drinks of alcohol    Types: 2 Glasses of wine per week    Comment: occas.   Drug use: Never   Sexual activity: Yes    Birth control/protection: None, Surgical  Other Topics Concern   Not on file  Social History Narrative   Not on file   Social Drivers of Health   Financial Resource Strain: Not on file  Food Insecurity: No Food Insecurity (08/18/2023)   Hunger Vital Sign    Worried About Running Out of Food in the Last Year: Never true    Ran Out of Food in the Last Year: Never true  Transportation Needs: No Transportation Needs (08/18/2023)   PRAPARE - Transportation    Lack of Transportation (Medical): No    Lack of  Transportation (Non-Medical): No  Physical Activity: Not on file  Stress: Not on file  Social Connections: Not on file  Intimate Partner Violence: Not At Risk (08/18/2023)   Humiliation, Afraid, Rape, and Kick questionnaire    Fear of Current or Ex-Partner: No    Emotionally Abused: No    Physically Abused: No    Sexually Abused: No    FAMILY HISTORY: Her family history includes Migraines in her brother, daughter, father, mother, and son.  CURRENT MEDICATIONS   Current Outpatient Medications  Medication Instructions   albuterol (VENTOLIN HFA) 108 (90 Base) MCG/ACT inhaler 2 puffs, As needed   Emgality 120  mg, Subcutaneous, Every 30 days   eszopiclone (LUNESTA) 2 MG TABS tablet Lunesta 2 mg tablet  Take 1 tablet as needed by oral route at bedtime.   eszopiclone (LUNESTA) 2 mg, Daily at bedtime   gabapentin (NEURONTIN) 100 mg, Daily at bedtime   Pediatric Multiple Vitamins (FLINTSTONES MULTIVITAMIN PO) 1 tablet, Daily   rizatriptan (MAXALT-MLT) 10 mg, Oral, As needed, May repeat in 2 hours if needed   UBRELVY 100 MG TABS TAKE 1 TABLET BY MOUTH AS NEEDED AT ONSET OF HEADACHE, MAY REPEAT 1 TABLET OF NEEDED. MAX: 2 TABLETS/DAY     ALLERGIES  She is allergic to nsaids.  REVIEW OF SYSTEMS:  Review of Systems - Oncology   Rest of the pertinent review of systems is unremarkable except as mentioned above in HPI.  PHYSICAL EXAMINATION:    Onc Performance Status - 08/18/23 1018       ECOG Perf Status   ECOG Perf Status Fully active, able to carry on all pre-disease performance without restriction      KPS SCALE   KPS % SCORE Normal, no compliants, no evidence of disease             Vitals:   08/18/23 1016  BP: 114/82  Pulse: 87  Resp: 17  Temp: 97.8 F (36.6 C)  SpO2: 100%   Filed Weights   08/18/23 1016  Weight: 157 lb 4.8 oz (71.4 kg)    Physical Exam Constitutional:      General: She is not in acute distress.    Appearance: Normal appearance.  HENT:     Head: Normocephalic and atraumatic.  Eyes:     General: No scleral icterus.    Conjunctiva/sclera: Conjunctivae normal.  Cardiovascular:     Rate and Rhythm: Normal rate and regular rhythm.     Heart sounds: Normal heart sounds.  Pulmonary:     Effort: Pulmonary effort is normal.     Breath sounds: Normal breath sounds.  Abdominal:     General: There is no distension.  Musculoskeletal:     Right lower leg: No edema.     Left lower leg: No edema.  Neurological:     General: No focal deficit present.     Mental Status: She is alert and oriented to person, place, and time.  Psychiatric:        Mood and  Affect: Mood normal.        Behavior: Behavior normal.        Thought Content: Thought content normal.      LABORATORY DATA:   I have reviewed the data as listed.  Results for orders placed or performed in visit on 08/18/23  D-dimer, quantitative  Result Value Ref Range   D-Dimer, Quant 0.28 0.00 - 0.50 ug/mL-FEU  CMP ([facility] only)  Result Value Ref Range   Sodium  142 135 - 145 mmol/L   Potassium 4.1 3.5 - 5.1 mmol/L   Chloride 107 98 - 111 mmol/L   CO2 29 22 - 32 mmol/L   Glucose, Bld 94 70 - 99 mg/dL   BUN 14 6 - 20 mg/dL   Creatinine 1.61 0.96 - 1.00 mg/dL   Calcium 9.5 8.9 - 04.5 mg/dL   Total Protein 7.2 6.5 - 8.1 g/dL   Albumin 4.7 3.5 - 5.0 g/dL   AST 53 (H) 15 - 41 U/L   ALT 70 (H) 0 - 44 U/L   Alkaline Phosphatase 110 38 - 126 U/L   Total Bilirubin 0.7 0.0 - 1.2 mg/dL   GFR, Estimated >40 >98 mL/min   Anion gap 6 5 - 15  CBC with Differential ([facility] Only)  Result Value Ref Range   WBC Count 3.8 (L) 4.0 - 10.5 K/uL   RBC 4.83 3.87 - 5.11 MIL/uL   Hemoglobin 14.7 12.0 - 15.0 g/dL   HCT 11.9 14.7 - 82.9 %   MCV 87.0 80.0 - 100.0 fL   MCH 30.4 26.0 - 34.0 pg   MCHC 35.0 30.0 - 36.0 g/dL   RDW 56.2 13.0 - 86.5 %   Platelet Count 217 150 - 400 K/uL   nRBC 0.0 0.0 - 0.2 %   Neutrophils Relative % 53 %   Neutro Abs 2.0 1.7 - 7.7 K/uL   Lymphocytes Relative 35 %   Lymphs Abs 1.4 0.7 - 4.0 K/uL   Monocytes Relative 9 %   Monocytes Absolute 0.4 0.1 - 1.0 K/uL   Eosinophils Relative 2 %   Eosinophils Absolute 0.1 0.0 - 0.5 K/uL   Basophils Relative 1 %   Basophils Absolute 0.0 0.0 - 0.1 K/uL   Immature Granulocytes 0 %   Abs Immature Granulocytes 0.01 0.00 - 0.07 K/uL     RADIOGRAPHIC STUDIES:  No pertinent imaging studies available to review.  Orders Placed This Encounter  Procedures   CBC with Differential ([facility] Only)    Standing Status:   Future    Number of Occurrences:   1    Expiration Date:   08/17/2024   CMP ([facility] only)    Standing Status:   Future    Number of Occurrences:   1    Expiration Date:   08/17/2024   D-dimer, quantitative    Standing Status:   Future    Number of Occurrences:   1    Expiration Date:   08/17/2024   Beta-2-glycoprotein i abs, IgG/M/A    Standing Status:   Future    Number of Occurrences:   1    Expiration Date:   08/17/2024   Cardiolipin antibodies, IgG, IgM, IgA    Standing Status:   Future    Number of Occurrences:   1    Expiration Date:   08/17/2024   Factor 5 leiden    Standing Status:   Future    Number of Occurrences:   1    Expiration Date:   08/17/2024   Prothrombin gene mutation    Standing Status:   Future    Number of Occurrences:   1    Expiration Date:   08/17/2024   Lupus anticoagulant panel    Standing Status:   Future    Number of Occurrences:   1    Expiration Date:   08/17/2024   PROTEIN S PANEL    Standing Status:   Future  Number of Occurrences:   1    Expiration Date:   08/17/2024   Protein C activity    Standing Status:   Future    Number of Occurrences:   1    Expiration Date:   08/17/2024   Antithrombin panel    Standing Status:   Future    Number of Occurrences:   1    Expiration Date:   08/17/2024    Future Appointments  Date Time Provider Department [facility]  08/23/2023  2:45 PM Earna Coder C, PT [facility] None  08/30/2023  8:00 AM Anson Fret, [facility] None  08/30/2023  8:45 AM Earna Coder C, PT [facility] None  09/06/2023  8:45 AM Earna Coder C, PT [facility] None  09/13/2023  8:45 AM Earna Coder C, PT [facility] None  09/20/2023  8:45 AM Earna Coder C, PT [facility] None  09/27/2023  8:45 AM Earna Coder C, PT [facility] None  10/04/2023  8:45 AM Earna Coder C, PT [facility] None  10/04/2023  3:00 PM Anson Fret, [facility] None  10/18/2023  8:45 AM Price, Leodis Binet, PT [facility] None    I spent a total of 55 minutes during this encounter with the patient including review of chart and various tests results,  discussions about plan of care and coordination of care plan.  This document was completed utilizing speech recognition software. Grammatical errors, random word insertions, pronoun errors, and incomplete sentences are an occasional consequence of this system due to software limitations, ambient noise, and hardware issues. Any formal questions or concerns about the content, text or information contained within the body of this dictation should be directly addressed to the provider for clarification.

## 2023-08-18 NOTE — Progress Notes (Signed)
 Patient does not need intervention at this time. She denies having any suicidal thoughts. 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 No plans to harm self.

## 2023-08-19 LAB — PROTEIN S PANEL
Protein S Activity: 108 % (ref 63–140)
Protein S Ag, Free: 115 % (ref 61–136)
Protein S Ag, Total: 86 % (ref 60–150)

## 2023-08-19 LAB — PROTEIN C ACTIVITY: Protein C Activity: 118 % (ref 73–180)

## 2023-08-19 LAB — LUPUS ANTICOAGULANT PANEL
DRVVT: 41.2 s (ref 0.0–47.0)
PTT Lupus Anticoagulant: 32.1 s (ref 0.0–43.5)

## 2023-08-19 LAB — ANTITHROMBIN PANEL
AT III AG PPP IMM-ACNC: 105 % (ref 72–124)
Antithrombin Activity: 130 % (ref 75–135)

## 2023-08-20 LAB — CARDIOLIPIN ANTIBODIES, IGG, IGM, IGA
Anticardiolipin IgA: <9 U/mL (ref 0–11)
Anticardiolipin IgG: <9 GPL U/mL (ref 0–14)
Anticardiolipin IgM: <9 [MPL'U]/mL (ref 0–12)

## 2023-08-20 LAB — BETA-2-GLYCOPROTEIN I ABS, IGG/M/A
Beta-2 Glyco I IgG: <9 GPI IgG units (ref 0–20)
Beta-2-Glycoprotein I IgA: <9 GPI IgA units (ref 0–25)
Beta-2-Glycoprotein I IgM: <9 GPI IgM units (ref 0–32)

## 2023-08-23 ENCOUNTER — Ambulatory Visit: Attending: Obstetrics and Gynecology | Admitting: Physical Therapy

## 2023-08-23 DIAGNOSIS — M6281 Muscle weakness (generalized): Secondary | ICD-10-CM | POA: Insufficient documentation

## 2023-08-23 DIAGNOSIS — R279 Unspecified lack of coordination: Secondary | ICD-10-CM | POA: Insufficient documentation

## 2023-08-23 LAB — FACTOR 5 LEIDEN

## 2023-08-23 NOTE — Therapy (Signed)
 OUTPATIENT PHYSICAL THERAPY FEMALE PELVIC TREATMENT   Patient Name: Chelsea Flores MRN: [id number] DOB:06-24-1970, 53 y.o., female Today's Date: 08/23/2023  END OF SESSION:  PT End of Session - 08/23/23 1521     Visit Number 2    Number of Visits 8    Date for PT Re-Evaluation 08/09/23    Authorization Type [employer]    PT Start Time 0245    PT Stop Time 0321    PT Time Calculation (min) 36 min    Activity Tolerance Patient tolerated treatment well    Behavior During Therapy WFL for tasks assessed/performed              Past Medical History:  Diagnosis Date   Anxiety    Asthma    Back pain    Complication of anesthesia    Depression    Dyspnea    asthma flaring   Dysrhythmia    Headache    Insomnia    Palpitations    PONV (postoperative nausea and vomiting)    PVC (premature ventricular contraction)    Snores    Tenosynovitis of thumb    Vertigo    Past Surgical History:  Procedure Laterality Date   CESAREAN SECTION     cist     removed from thumb   TUBAL LIGATION     Patient Active Problem List   Diagnosis Date Noted   Family history of thromboembolic disease 40/98/1191   Tremor 04/05/2023   Chronic migraine without aura without status migrainosus, not intractable 11/30/2021   Left foot pain 01/09/2016   Hip pain 04/13/2012    PCP: Margarete Sharps, MD  REFERRING PROVIDER: Thora Flint, MD   REFERRING DIAG: [phone] (ICD-10-CM) - Incontinence overflow, stress female  THERAPY DIAG:  Muscle weakness (generalized)  Unspecified lack of coordination  Rationale for Evaluation and Treatment: Rehabilitation  ONSET DATE: 2018  SUBJECTIVE:                                                                                                                                                                                           SUBJECTIVE STATEMENT: Patient reports that she hasn't been running much recently. She went on a big bike 2 weeks ago and  had some minimal leakage during this. She hasn't had any leakage with sneezing or coughing in the past week.   From Eval: Patient reports to PT with urinary incontinence "at times" - When training for a half marathon in 2018 she began noticing incontinence issues which has continued to progress. For example, recently patient had the flu and was coughing a lot during this time, therefore  she experienced more leakage than normal. Patient is able to run a few miles before leakage begins while running. Fluid intake: earl grey tea in the mornings, 1-1.5 liters of water per day; when migraines are not present, 2+ liters; flavored club soda in the evenings occasionally   PAIN:  Are you having pain? No NPRS scale: 0/10  PRECAUTIONS: None  RED FLAGS: None   WEIGHT BEARING RESTRICTIONS: No  FALLS:  Has patient fallen in last 6 months? 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   ACTIVITY LEVEL : not running currently due to leakage, wishes to do another half marathon; youtube weightlifting videos   PLOF: Independent  PATIENT GOALS: return back to running without leakage, to do exercises independently   PERTINENT HISTORY:  2 c-section, gallbladder removal  Sexual abuse: No  BOWEL MOVEMENT: Pain with bowel movement: No Type of bowel movement:Type (Bristol Stool Scale) 2, Frequency 1x/day usually, Strain no, and Splinting yes on occasion Fully empty rectum: Yes:   Leakage: No Pads: No Fiber supplement/laxative No  URINATION: Pain with urination: No Fully empty bladder: Yes:   Stream: Strong Urgency: Yes  Frequency: within normal limits Leakage: Coughing and Sneezing Pads: No  INTERCOURSE: when husband is in town, yes   Ability to have vaginal penetration Yes  Pain with intercourse: Initial Penetration, During Penetration, Deep Penetration, and After Intercourse DrynessNo Climax: yes, no pain  Marinoff Scale: 0/3 No lubricant  PREGNANCY: Vaginal deliveries  0 C-section deliveries 2 (2000, 2003) Currently pregnant No  PROLAPSE: None  OBJECTIVE:  Note: Objective measures were completed at Evaluation unless otherwise noted.  PATIENT SURVEYS:  PFIQ-7: 10  COGNITION: Overall cognitive status: Within functional limits for tasks assessed     SENSATION: Light touch: Appears intact  LUMBAR SPECIAL TESTS:  Single leg stance test: Positive  FUNCTIONAL TESTS:  Squat:no pain, sufficient lumbopelvic mobility    GAIT: Assistive device utilized: None Comments: mild trendelenburg gait pattern with ambulation   POSTURE: rounded shoulders and forward head  LUMBARAROM/PROM: within normal limits   A/PROM A/PROM  eval  Flexion   Extension   Right lateral flexion   Left lateral flexion   Right rotation   Left rotation    (Blank rows = not tested)  LOWER EXTREMITY ROM: within normal limits   Active ROM Right eval Left eval  Hip flexion    Hip extension    Hip abduction    Hip adduction    Hip internal rotation    Hip external rotation    Knee flexion    Knee extension    Ankle dorsiflexion    Ankle plantarflexion    Ankle inversion    Ankle eversion     (Blank rows = not tested)  LOWER EXTREMITY MMT: 4+/5 bilateral hips and knees grossly   MMT Right eval Left eval  Hip flexion    Hip extension    Hip abduction    Hip adduction    Hip internal rotation    Hip external rotation    Knee flexion    Knee extension    Ankle dorsiflexion    Ankle plantarflexion    Ankle inversion    Ankle eversion     (Blank rows = not tested) PALPATION:   General: no significant tension in bilateral adductors/hip flexors   Pelvic Alignment: within normal limits   Abdominal: abdominal bracing at rest, upper chest breathing, decreased rib excursion with inhalation  External Perineal Exam: minimal dryness noted, no pain with palpation of superficial musculature, sufficient clitoral hood mobility                               Internal Pelvic Floor: no significant muscle tension noted; no pain with examination of pelvic floor musculature layers 1/2/3; general lack of coordination between pelvic floor and diaphragm   Patient confirms identification and approves PT to assess internal pelvic floor and treatment Yes No emotional/communication barriers or cognitive limitation. Patient is motivated to learn. Patient understands and agrees with treatment goals and plan. PT explains patient will be examined in standing, sitting, and lying down to see how their muscles and joints work. When they are ready, they will be asked to remove their underwear so PT can examine their perineum. The patient is also given the option of providing their own chaperone as one is not provided in our facility. The patient also has the right and is explained the right to defer or refuse any part of the evaluation or treatment including the internal exam. With the patient's consent, PT will use one gloved finger to gently assess the muscles of the pelvic floor, seeing how well it contracts and relaxes and if there is muscle symmetry. After, the patient will get dressed and PT and patient will discuss exam findings and plan of care. PT and patient discuss plan of care, schedule, attendance policy and HEP activities.  PELVIC MMT:   MMT eval  Vaginal 3/5, 3 sec hold, 10 quick flicks   Internal Anal Sphincter   External Anal Sphincter   Puborectalis   Diastasis Recti   (Blank rows = not tested)  TONE: Within normal limits   PROLAPSE: N/A   TODAY'S TREATMENT:                                                                                                                              DATE:   EVAL 07/12/23: Examination completed, findings reviewed, pt educated on POC, HEP, and self care. Pt motivated to participate in PT and agreeable to attempt recommendations.  Neuro re-ed: Hooklying diaphragmatic breathing + pelvic floor musculature range of  motion with inhalation/exhalation 3x10 breaths  Hooklying pelvic floor quick flicks + diaphragmatic breathing 2x10  Self care: Relative anatomy, connection between the pelvic floor and the diaphragm, pelvic floor active range of motion, vaginal moisturizers   08/23/23: Neuro re-ed: seated diaphragmatic breathing + pelvic floor musculature range of motion with inhalation/exhalation 3x10 breaths  seated pelvic floor quick flicks + diaphragmatic breathing 2x10  Therapeutic exercise: Supine butterfly stretch + diaphragmatic breathing 2x16min  Single knee to chest stretch + diaphragmatic breathing 2x57min  Childs pose + diaphragmatic breathing 2x22min  Lower trunk rotations in reclined position and in supine 2x10 each direction  Pretzel piriformis stretch + diaphragmatic breathing 2x52min each side  Self care: Relative anatomy, connection between the pelvic  floor and the diaphragm, pelvic floor active range of motion, vaginal moisturizers Knack technique for stress incontinence, urge drill for urge urinary incontinence   PATIENT EDUCATION:  Education details: Relative anatomy, connection between the pelvic floor and the diaphragm, pelvic floor active range of motion, vaginal moisturizers  Person educated: Patient Education method: Explanation, Demonstration, Tactile cues, Verbal cues, and Handouts Education comprehension: verbalized understanding, returned demonstration, verbal cues required, and tactile cues required  HOME EXERCISE PROGRAM: Access Code: [email or web address] URL: [email or web address] Date: 08/23/2023 Prepared by: Robbin Chill  Exercises - Seated Pelvic Floor Contraction  - 1 x daily - 7 x weekly - 2 sets - 10 reps - Seated Quick Flick Pelvic Floor Contractions  - 1 x daily - 7 x weekly - 2 sets - 10-15 reps - Supine Butterfly Groin Stretch  - 1 x daily - 7 x weekly - 2 sets -  hold - Supine Single Knee to Chest Stretch  - 1 x daily - 7 x weekly - 2 sets - hold -  Supine Figure 4 Piriformis Stretch  - 1 x daily - 7 x weekly - 2 sets - hold - Supine Hip Internal and External Rotation  - 1 x daily - 7 x weekly - 2 sets - 10 reps - Child's Pose Stretch  - 1 x daily - 7 x weekly - 2 sets - hold  ASSESSMENT:  CLINICAL IMPRESSION: Patient is a 53 y.o. female  who was seen today for physical therapy treatment for stress incontinence. Patient has had no instances of leakage since last visit other than mild dampness in underwear following a strenuous hike. Pelvic floor training exercises progressed today to involve more gravitational load to the pelvic floor during breathing techniques. Downtraining stretches provided to ensure that patient could feel her pelvic floor relax during active range of motion training. No pain during or after today's treatment session. Pt would benefit from additional PT to further address deficits.    OBJECTIVE IMPAIRMENTS: decreased coordination, decreased endurance, decreased mobility, decreased ROM, and decreased strength.   ACTIVITY LIMITATIONS: continence  PARTICIPATION LIMITATIONS:  running for long distances  PERSONAL FACTORS: Past/current experiences and Time since onset of injury/illness/exacerbation are also affecting patient's functional outcome.   REHAB POTENTIAL: Good  CLINICAL DECISION MAKING: Stable/uncomplicated  EVALUATION COMPLEXITY: Low   GOALS: Goals reviewed with patient? Yes  SHORT TERM GOALS: Target date: 08/09/2023  Pt will be independent with HEP.  Baseline: Goal status: INITIAL  2.  Pt will be independent with diaphragmatic breathing and down training activities in order to improve pelvic floor relaxation.  Baseline:  Goal status: INITIAL  3.  Pt will be independent with the knack, urge suppression technique, and double voiding in order to improve bladder habits and decrease urinary incontinence.   Baseline:  Goal status: INITIAL  LONG TERM GOALS: Target date: 01/12/2024  Pt will be  independent with advanced HEP.  Baseline:  Goal status: INITIAL  2.  Pt to demonstrate improved coordination of pelvic floor and breathing mechanics with 10# squat with appropriate synergistic patterns to decrease pain and leakage at least 75% of the time.    Baseline:  Goal status: INITIAL  3.  Pt to demonstrate at least 4+/5 pelvic floor strength for improved pelvic stability and decreased strain at pelvic floor/ decrease leakage.   Baseline:  Goal status: INITIAL  4.  Pt will be able to run/workout for 30 minutes without leakage or discomfort to improve quality of life and  stress urinary incontinence. Baseline:  Goal status: INITIAL  PLAN:  PT FREQUENCY: 1x/week  PT DURATION: 8 weeks  PLANNED INTERVENTIONS: 97110-Therapeutic exercises, 97530- Therapeutic activity, 97112- Neuromuscular re-education, 97535- Self Care, 78295- Manual therapy, Taping, Dry Needling, Joint mobilization, Spinal mobilization, Scar mobilization, Cryotherapy, and Moist heat  PLAN FOR NEXT SESSION: continued pelvic floor AROM training to seated/standing, introduce hip strengthening and progressive pelvic floor training with emphasis on diaphragmatic breathing   Marni Sins, PT 08/23/2023, 3:21 PM

## 2023-08-23 NOTE — Patient Instructions (Signed)
?  THE KNACK ? ?The Knack is a strategy you may use to help to reduce or prevent leakage or passing of urine, gas or feces during an activity that causes downward force on the pelvic floor muscles.   ? ?Activities that can cause downward pressure on the pelvic floor muscles include coughing, sneezing, laughing, bending, lifting, and transitioning from different body positions such as from laying down to sitting up and sitting to standing. ? ?To perform The Knack, consciously squeeze and lift your pelvic floor muscles to perform a strong, well-timed pelvic muscle contraction BEFORE AND DURING these activities above.  As your contraction gets more coordinated and your muscles get stronger, you will become more effective in controlling your experience of incontinence or gas passing during these activities.   ? ? ?Urge Incontinence ? ?Ideal urination frequency is every 2-4 wakeful hours, which equates to 5-8 times within a 24-hour period.   ?Urge incontinence is leakage that occurs when the bladder muscle contracts, creating a sudden need to go before getting to the bathroom.   ?Going too often when your bladder isn't actually full can disrupt the body's automatic signals to store and hold urine longer, which will increase urgency/frequency.  In this case, the bladder ?is running the show? and strategies can be learned to retrain this pattern.   ?One should be able to control the first urge to urinate, at around 138mL.  The bladder can hold up to a ?grande latte,? or 455mL. ?To help you gain control, practice the Urge Drill below when urgency strikes.  This drill will help retrain your bladder signals and allow you to store and hold urine longer.  The overall goal is to stretch out your time between voids to reach a more manageable voiding schedule.    ?Practice your "quick flicks" often throughout the day (each waking hour) even when you don't need feel the urge to go.  This will help strengthen your pelvic floor  muscles, making them more effective in controlling leakage. ? ?Urge Drill ? ?When you feel an urge to go, follow these steps to regain control: ?Stop what you are doing and be still ?Take one deep breath, directing your air into your abdomen ?Think an affirming thought, such as ?I've got this.? ?Do 5 quick flicks of your pelvic floor ?Walk with control to the bathroom to void, or delay voiding ? ? ?

## 2023-08-24 LAB — PROTHROMBIN GENE MUTATION

## 2023-08-30 ENCOUNTER — Ambulatory Visit: Admitting: Physical Therapy

## 2023-08-30 ENCOUNTER — Encounter: Payer: Self-pay | Admitting: Neurology

## 2023-08-30 ENCOUNTER — Ambulatory Visit (INDEPENDENT_AMBULATORY_CARE_PROVIDER_SITE_OTHER): Admitting: Neurology

## 2023-08-30 DIAGNOSIS — M6281 Muscle weakness (generalized): Secondary | ICD-10-CM | POA: Diagnosis not present

## 2023-08-30 DIAGNOSIS — R279 Unspecified lack of coordination: Secondary | ICD-10-CM

## 2023-08-30 DIAGNOSIS — G4700 Insomnia, unspecified: Secondary | ICD-10-CM | POA: Diagnosis not present

## 2023-08-30 DIAGNOSIS — G43709 Chronic migraine without aura, not intractable, without status migrainosus: Secondary | ICD-10-CM

## 2023-08-30 MED ORDER — ABOBOTULINUMTOXINA 500 UNITS IM SOLR
500.0000 [IU] | Freq: Once | INTRAMUSCULAR | Status: AC
  Administered 2023-08-30: 500 [IU] via INTRAMUSCULAR

## 2023-08-30 NOTE — Progress Notes (Signed)
 Consent Form Botulism Toxin Injection For Chronic Migraine   08/30/2023: >50% improvement in migraine freq and severity. She is a International aid/development worker. Place botox  high in the forehead to avoid forehead droop and vision problems (has a low brow)  05/23/2023: First botulinum toxin. We diluted the 500units of dysport  with 3.5 ml so that every 0.23ml is approx 15units dysport  which is approx 5units botox  in order to replicate the migraine protocol with Botox  as best we could. Insurance would not approve Botox  but it did approve dysport  for migraine which is unusual. Will inject frontalis very high.  Reviewed orally with patient, additionally signature is on file:  Botulism toxin has been approved by the Federal drug administration for treatment of chronic migraine. Botulism toxin does not cure chronic migraine and it may not be effective in some patients.  The administration of botulism toxin is accomplished by injecting a small amount of toxin into the muscles of the neck and head. Dosage must be titrated for each individual. Any benefits resulting from botulism toxin tend to wear off after 3 months with a repeat injection required if benefit is to be maintained. Injections are usually done every 3-4 months with maximum effect peak achieved by about 2 or 3 weeks. Botulism toxin is expensive and you should be sure of what costs you will incur resulting from the injection.  The side effects of botulism toxin use for chronic migraine may include:   -Transient, and usually mild, facial weakness with facial injections  -Transient, and usually mild, head or neck weakness with head/neck injections  -Reduction or loss of forehead facial animation due to forehead muscle weakness  -Eyelid drooping  -Dry eye  -Pain at the site of injection or bruising at the site of injection  -Double vision  -Potential unknown long term risks  Contraindications: You should not have Botox  if you are pregnant, nursing, allergic to  albumin, have an infection, skin condition, or muscle weakness at the site of the injection, or have myasthenia gravis, Lambert-Eaton syndrome, or ALS.  It is also possible that as with any injection, there may be an allergic reaction or no effect from the medication. Reduced effectiveness after repeated injections is sometimes seen and rarely infection at the injection site may occur. All care will be taken to prevent these side effects. If therapy is given over a long time, atrophy and wasting in the muscle injected may occur. Occasionally the patient's become refractory to treatment because they develop antibodies to the toxin. In this event, therapy needs to be modified.  I have read the above information and consent to the administration of botulism toxin.    BOTOX  PROCEDURE NOTE FOR MIGRAINE HEADACHE    Contraindications and precautions discussed with patient(above). Aseptic procedure was observed and patient tolerated procedure. Procedure performed by Dr. Criselda Dolly  The condition has existed for more than 6 months, and pt does not have a diagnosis of ALS, Myasthenia Gravis or Lambert-Eaton Syndrome.  Risks and benefits of injections discussed and pt agrees to proceed with the procedure.  Written consent obtained  These injections are medically necessary. Pt  receives good benefits from these injections. These injections do not cause sedations or hallucinations which the oral therapies may cause.  Description of procedure:  The patient was placed in a sitting position. The standard protocol was used for Botox  as follows, with 5 units of Botox  injected at each site:   -Procerus muscle, midline injection  -Corrugator muscle, bilateral injection  -Frontalis muscle, bilateral  injection, with 2 sites each side, medial injection was performed in the upper one third of the frontalis muscle, in the region vertical from the medial inferior edge of the superior orbital rim. The lateral  injection was again in the upper one third of the forehead vertically above the lateral limbus of the cornea, 1.5 cm lateral to the medial injection site.  -Temporalis muscle injection, 4 sites, bilaterally. The first injection was 3 cm above the tragus of the ear, second injection site was 1.5 cm to 3 cm up from the first injection site in line with the tragus of the ear. The third injection site was 1.5-3 cm forward between the first 2 injection sites. The fourth injection site was 1.5 cm posterior to the second injection site.   -Occipitalis muscle injection, 3 sites, bilaterally. The first injection was done one half way between the occipital protuberance and the tip of the mastoid process behind the ear. The second injection site was done lateral and superior to the first, 1 fingerbreadth from the first injection. The third injection site was 1 fingerbreadth superiorly and medially from the first injection site.  -Cervical paraspinal muscle injection, 2 sites, bilateral knee first injection site was 1 cm from the midline of the cervical spine, 3 cm inferior to the lower border of the occipital protuberance. The second injection site was 1.5 cm superiorly and laterally to the first injection site.  -Trapezius muscle injection was performed at 3 sites, bilaterally. The first injection site was in the upper trapezius muscle halfway between the inflection point of the neck, and the acromion. The second injection site was one half way between the acromion and the first injection site. The third injection was done between the first injection site and the inflection point of the neck.   Will return for repeat injection in 3 months.   500 units of Dysport  was used. The patient tolerated the procedure well, there were no complications of the above procedure.

## 2023-08-30 NOTE — Therapy (Signed)
 OUTPATIENT PHYSICAL THERAPY FEMALE PELVIC TREATMENT   Patient Name: Chelsea Flores MRN: [id number] DOB:[birth year], 53 y.o., female Today's Date: 08/30/2023  END OF SESSION:  PT End of Session - 08/30/23 1607     Visit Number 3    Number of Visits 8    Date for PT Re-Evaluation 08/09/23    Authorization Type [employer]    PT Start Time 0330    PT Stop Time 0410    PT Time Calculation (min) 40 min    Activity Tolerance Patient tolerated treatment well    Behavior During Therapy WFL for tasks assessed/performed               Past Medical History:  Diagnosis Date   Anxiety    Asthma    Back pain    Complication of anesthesia    Depression    Dyspnea    asthma flaring   Dysrhythmia    Headache    Insomnia    Palpitations    PONV (postoperative nausea and vomiting)    PVC (premature ventricular contraction)    Snores    Tenosynovitis of thumb    Vertigo    Past Surgical History:  Procedure Laterality Date   CESAREAN SECTION     cist     removed from thumb   TUBAL LIGATION     Patient Active Problem List   Diagnosis Date Noted   Family history of thromboembolic disease 78/29/5621   Tremor 04/05/2023   Chronic migraine without aura without status migrainosus, not intractable 11/30/2021   Left foot pain 01/09/2016   Hip pain 04/13/2012    PCP: Margarete Sharps, MD  REFERRING PROVIDER: Thora Flint, MD   REFERRING DIAG: [phone] (ICD-10-CM) - Incontinence overflow, stress female  THERAPY DIAG:  Muscle weakness (generalized)  Unspecified lack of coordination  Rationale for Evaluation and Treatment: Rehabilitation  ONSET DATE: 2018  SUBJECTIVE:                                                                                                                                                                                           SUBJECTIVE STATEMENT: Patient reports no instances of leakage since last visit. She was consistent with her homework this  week. She still hasn't been running much recently.    From Eval: Patient reports to PT with urinary incontinence "at times" - When training for a half marathon in 2018 she began noticing incontinence issues which has continued to progress. For example, recently patient had the flu and was coughing a lot during this time, therefore she experienced more leakage than normal. Patient is able to run a  few miles before leakage begins while running. Fluid intake: earl grey tea in the mornings, 1-1.5 liters of water per day; when migraines are not present, 2+ liters; flavored club soda in the evenings occasionally   PAIN:  Are you having pain? No NPRS scale: 0/10  PRECAUTIONS: None  RED FLAGS: None   WEIGHT BEARING RESTRICTIONS: No  FALLS:  Has patient fallen in last 6 months? 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   ACTIVITY LEVEL : not running currently due to leakage, wishes to do another half marathon; youtube weightlifting videos   PLOF: Independent  PATIENT GOALS: return back to running without leakage, to do exercises independently   PERTINENT HISTORY:  2 c-section, gallbladder removal  Sexual abuse: No  BOWEL MOVEMENT: Pain with bowel movement: No Type of bowel movement:Type (Bristol Stool Scale) 2, Frequency 1x/day usually, Strain no, and Splinting yes on occasion Fully empty rectum: Yes:   Leakage: No Pads: No Fiber supplement/laxative No  URINATION: Pain with urination: No Fully empty bladder: Yes:   Stream: Strong Urgency: Yes  Frequency: within normal limits Leakage: Coughing and Sneezing Pads: No  INTERCOURSE: when husband is in town, yes   Ability to have vaginal penetration Yes  Pain with intercourse: Initial Penetration, During Penetration, Deep Penetration, and After Intercourse DrynessNo Climax: yes, no pain  Marinoff Scale: 0/3 No lubricant  PREGNANCY: Vaginal deliveries 0 C-section deliveries 2 (2000, 2003) Currently pregnant  No  PROLAPSE: None  OBJECTIVE:  Note: Objective measures were completed at Evaluation unless otherwise noted.  PATIENT SURVEYS:  PFIQ-7: 10  COGNITION: Overall cognitive status: Within functional limits for tasks assessed     SENSATION: Light touch: Appears intact  LUMBAR SPECIAL TESTS:  Single leg stance test: Positive  FUNCTIONAL TESTS:  Squat:no pain, sufficient lumbopelvic mobility    GAIT: Assistive device utilized: None Comments: mild trendelenburg gait pattern with ambulation   POSTURE: rounded shoulders and forward head  LUMBARAROM/PROM: within normal limits   A/PROM A/PROM  eval  Flexion   Extension   Right lateral flexion   Left lateral flexion   Right rotation   Left rotation    (Blank rows = not tested)  LOWER EXTREMITY ROM: within normal limits   Active ROM Right eval Left eval  Hip flexion    Hip extension    Hip abduction    Hip adduction    Hip internal rotation    Hip external rotation    Knee flexion    Knee extension    Ankle dorsiflexion    Ankle plantarflexion    Ankle inversion    Ankle eversion     (Blank rows = not tested)  LOWER EXTREMITY MMT: 4+/5 bilateral hips and knees grossly   MMT Right eval Left eval  Hip flexion    Hip extension    Hip abduction    Hip adduction    Hip internal rotation    Hip external rotation    Knee flexion    Knee extension    Ankle dorsiflexion    Ankle plantarflexion    Ankle inversion    Ankle eversion     (Blank rows = not tested) PALPATION:   General: no significant tension in bilateral adductors/hip flexors   Pelvic Alignment: within normal limits   Abdominal: abdominal bracing at rest, upper chest breathing, decreased rib excursion with inhalation                 External Perineal Exam: minimal  dryness noted, no pain with palpation of superficial musculature, sufficient clitoral hood mobility                              Internal Pelvic Floor: no significant muscle  tension noted; no pain with examination of pelvic floor musculature layers 1/2/3; general lack of coordination between pelvic floor and diaphragm   Patient confirms identification and approves PT to assess internal pelvic floor and treatment Yes No emotional/communication barriers or cognitive limitation. Patient is motivated to learn. Patient understands and agrees with treatment goals and plan. PT explains patient will be examined in standing, sitting, and lying down to see how their muscles and joints work. When they are ready, they will be asked to remove their underwear so PT can examine their perineum. The patient is also given the option of providing their own chaperone as one is not provided in our facility. The patient also has the right and is explained the right to defer or refuse any part of the evaluation or treatment including the internal exam. With the patient's consent, PT will use one gloved finger to gently assess the muscles of the pelvic floor, seeing how well it contracts and relaxes and if there is muscle symmetry. After, the patient will get dressed and PT and patient will discuss exam findings and plan of care. PT and patient discuss plan of care, schedule, attendance policy and HEP activities.  PELVIC MMT:   MMT eval  Vaginal 3/5, 3 sec hold, 10 quick flicks   Internal Anal Sphincter   External Anal Sphincter   Puborectalis   Diastasis Recti   (Blank rows = not tested)  TONE: Within normal limits   PROLAPSE: N/A   TODAY'S TREATMENT:                                                                                                                              DATE:   EVAL 07/12/23: Examination completed, findings reviewed, pt educated on POC, HEP, and self care. Pt motivated to participate in PT and agreeable to attempt recommendations.  Neuro re-ed: Hooklying diaphragmatic breathing + pelvic floor musculature range of motion with inhalation/exhalation 3x10 breaths   Hooklying pelvic floor quick flicks + diaphragmatic breathing 2x10  Self care: Relative anatomy, connection between the pelvic floor and the diaphragm, pelvic floor active range of motion, vaginal moisturizers   08/23/23: Neuro re-ed: seated diaphragmatic breathing + pelvic floor musculature range of motion with inhalation/exhalation 3x10 breaths  seated pelvic floor quick flicks + diaphragmatic breathing 2x10  Clamshell (GTB around knees) + reverse clamshell + diaphragmatic breathing 2x12 Bridge + ball squeeze + diaphragmatic breathing 2x10  Sit to stand + ball squeeze + diaphragmatic breathing 2x10  Hooklying alternating march + GTB + diaphragmatic breathing 2x10 alternating  Therapeutic exercise: Supine butterfly stretch + diaphragmatic breathing 2x8min  Childs pose + diaphragmatic breathing 2x18min  Pretzel piriformis stretch +  diaphragmatic breathing 2x78min each side  Self care: Relative anatomy, connection between the pelvic floor and the diaphragm, pelvic floor active range of motion, vaginal moisturizers Knack technique for stress incontinence, urge drill for urge urinary incontinence   PATIENT EDUCATION:  Education details: Relative anatomy, connection between the pelvic floor and the diaphragm, pelvic floor active range of motion, vaginal moisturizers  Person educated: Patient Education method: Explanation, Demonstration, Tactile cues, Verbal cues, and Handouts Education comprehension: verbalized understanding, returned demonstration, verbal cues required, and tactile cues required  HOME EXERCISE PROGRAM: Access Code: [email or web address] URL: [email or web address] Date: 08/30/2023 Prepared by: Robbin Chill  Exercises - Seated Pelvic Floor Contraction  - 1 x daily - 7 x weekly - 2 sets - 10 reps - Seated Quick Flick Pelvic Floor Contractions  - 1 x daily - 7 x weekly - 2 sets - 10-15 reps - Clamshell with Resistance  - 1 x daily - 7 x weekly - 2 sets - 12 reps -  Sidelying Reverse Clamshell  - 1 x daily - 7 x weekly - 2 sets - 12 reps - Supine Bridge with Mini Swiss Ball Between Knees  - 1 x daily - 7 x weekly - 2 sets - 10 reps - Supine March with Resistance Band  - 1 x daily - 7 x weekly - 2 sets - 10 reps - Sit to Stand with Ball Between Knees  - 1 x daily - 7 x weekly - 2 sets - 10 reps - Supine Butterfly Groin Stretch  - 1 x daily - 7 x weekly - 2 sets -  hold - Supine Figure 4 Piriformis Stretch  - 1 x daily - 7 x weekly - 2 sets - hold - Child's Pose Stretch  - 1 x daily - 7 x weekly - 2 sets - hold  ASSESSMENT:  CLINICAL IMPRESSION: Patient is a 53 y.o. female  who was seen today for physical therapy treatment for stress incontinence. Patient has had no instances of leakage since last visit, but she still hasn't been running much. Pelvic floor training exercises progressed today to involve more gravitational load to the pelvic floor during breathing techniques. Addition of adductor/glute/pelvic floor co-contractions went very well today. No pain during or after today's treatment session. Pt would benefit from additional PT to further address deficits.    OBJECTIVE IMPAIRMENTS: decreased coordination, decreased endurance, decreased mobility, decreased ROM, and decreased strength.   ACTIVITY LIMITATIONS: continence  PARTICIPATION LIMITATIONS:  running for long distances  PERSONAL FACTORS: Past/current experiences and Time since onset of injury/illness/exacerbation are also affecting patient's functional outcome.   REHAB POTENTIAL: Good  CLINICAL DECISION MAKING: Stable/uncomplicated  EVALUATION COMPLEXITY: Low   GOALS: Goals reviewed with patient? Yes  SHORT TERM GOALS: Target date: 08/09/2023  Pt will be independent with HEP.  Baseline: Goal status: INITIAL  2.  Pt will be independent with diaphragmatic breathing and down training activities in order to improve pelvic floor relaxation.  Baseline:  Goal status:  INITIAL  3.  Pt will be independent with the knack, urge suppression technique, and double voiding in order to improve bladder habits and decrease urinary incontinence.   Baseline:  Goal status: INITIAL  LONG TERM GOALS: Target date: 01/12/2024  Pt will be independent with advanced HEP.  Baseline:  Goal status: INITIAL  2.  Pt to demonstrate improved coordination of pelvic floor and breathing mechanics with 10# squat with appropriate synergistic patterns to decrease pain and leakage at least 75% of  the time.    Baseline:  Goal status: INITIAL  3.  Pt to demonstrate at least 4+/5 pelvic floor strength for improved pelvic stability and decreased strain at pelvic floor/ decrease leakage.   Baseline:  Goal status: INITIAL  4.  Pt will be able to run/workout for 30 minutes without leakage or discomfort to improve quality of life and stress urinary incontinence. Baseline:  Goal status: INITIAL  PLAN:  PT FREQUENCY: 1x/week  PT DURATION: 8 weeks  PLANNED INTERVENTIONS: 97110-Therapeutic exercises, 97530- Therapeutic activity, 97112- Neuromuscular re-education, 97535- Self Care, 16109- Manual therapy, Taping, Dry Needling, Joint mobilization, Spinal mobilization, Scar mobilization, Cryotherapy, and Moist heat  PLAN FOR NEXT SESSION: continued pelvic floor AROM training to seated/standing, introduce hip strengthening and progressive pelvic floor training with emphasis on diaphragmatic breathing   Marni Sins, PT 08/30/2023, 4:08 PM

## 2023-08-30 NOTE — Progress Notes (Signed)
 Dysport - 500 units x 1 vial Lot: [id number] Expiration: 02/06/2025 NDC: [address]-8  Bacteriostatic 0.9% Sodium Chloride - 3.5 mL  Lot: [id number] Expiration: 03/10/2024 NDC: 3086-5784-69  Dx: G29.528 B/B Witnessed by Inocencio Mania, RN

## 2023-09-06 ENCOUNTER — Encounter: Admitting: Physical Therapy

## 2023-09-06 ENCOUNTER — Ambulatory Visit: Payer: Self-pay | Admitting: Physical Therapy

## 2023-09-06 DIAGNOSIS — M6281 Muscle weakness (generalized): Secondary | ICD-10-CM | POA: Diagnosis not present

## 2023-09-06 DIAGNOSIS — R279 Unspecified lack of coordination: Secondary | ICD-10-CM | POA: Diagnosis not present

## 2023-09-06 NOTE — Therapy (Signed)
 OUTPATIENT PHYSICAL THERAPY FEMALE PELVIC TREATMENT   Patient Name: Chelsea Flores MRN: [id number] DOB:[birth year], 53 y.o., female Today's Date: 09/06/2023  END OF SESSION:  PT End of Session - 09/06/23 1648     Visit Number 4    Number of Visits 8    Date for PT Re-Evaluation 08/09/23    Authorization Type [employer]    PT Start Time 0415    PT Stop Time 0455    PT Time Calculation (min) 40 min    Activity Tolerance Patient tolerated treatment well    Behavior During Therapy WFL for tasks assessed/performed                Past Medical History:  Diagnosis Date   Anxiety    Asthma    Back pain    Complication of anesthesia    Depression    Dyspnea    asthma flaring   Dysrhythmia    Headache    Insomnia    Palpitations    PONV (postoperative nausea and vomiting)    PVC (premature ventricular contraction)    Snores    Tenosynovitis of thumb    Vertigo    Past Surgical History:  Procedure Laterality Date   CESAREAN SECTION     cist     removed from thumb   TUBAL LIGATION     Patient Active Problem List   Diagnosis Date Noted   Family history of thromboembolic disease 59/56/3875   Tremor 04/05/2023   Chronic migraine without aura without status migrainosus, not intractable 11/30/2021   Left foot pain 01/09/2016   Hip pain 04/13/2012    PCP: Margarete Sharps, MD  REFERRING PROVIDER: Thora Flint, MD   REFERRING DIAG: [phone] (ICD-10-CM) - Incontinence overflow, stress female  THERAPY DIAG:  Muscle weakness (generalized)  Unspecified lack of coordination  Rationale for Evaluation and Treatment: Rehabilitation  ONSET DATE: 2018  SUBJECTIVE:                                                                                                                                                                                           SUBJECTIVE STATEMENT: Patient got back from the beach earlier today. She is feeling good today. No reports of urinary  incontinence this week.  From Eval: Patient reports to PT with urinary incontinence "at times" - When training for a half marathon in 2018 she began noticing incontinence issues which has continued to progress. For example, recently patient had the flu and was coughing a lot during this time, therefore she experienced more leakage than normal. Patient is able to run a few miles before leakage begins  while running. Fluid intake: earl grey tea in the mornings, 1-1.5 liters of water per day; when migraines are not present, 2+ liters; flavored club soda in the evenings occasionally   PAIN:  Are you having pain? No NPRS scale: 0/10  PRECAUTIONS: None  RED FLAGS: None   WEIGHT BEARING RESTRICTIONS: No  FALLS:  Has patient fallen in last 6 months? 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   ACTIVITY LEVEL : not running currently due to leakage, wishes to do another half marathon; youtube weightlifting videos   PLOF: Independent  PATIENT GOALS: return back to running without leakage, to do exercises independently   PERTINENT HISTORY:  2 c-section, gallbladder removal  Sexual abuse: No  BOWEL MOVEMENT: Pain with bowel movement: No Type of bowel movement:Type (Bristol Stool Scale) 2, Frequency 1x/day usually, Strain no, and Splinting yes on occasion Fully empty rectum: Yes:   Leakage: No Pads: No Fiber supplement/laxative No  URINATION: Pain with urination: No Fully empty bladder: Yes:   Stream: Strong Urgency: Yes  Frequency: within normal limits Leakage: Coughing and Sneezing Pads: No  INTERCOURSE: when husband is in town, yes   Ability to have vaginal penetration Yes  Pain with intercourse: Initial Penetration, During Penetration, Deep Penetration, and After Intercourse DrynessNo Climax: yes, no pain  Marinoff Scale: 0/3 No lubricant  PREGNANCY: Vaginal deliveries 0 C-section deliveries 2 (2000, 2003) Currently pregnant No  PROLAPSE: None  OBJECTIVE:   Note: Objective measures were completed at Evaluation unless otherwise noted.  PATIENT SURVEYS:  PFIQ-7: 10  COGNITION: Overall cognitive status: Within functional limits for tasks assessed     SENSATION: Light touch: Appears intact  LUMBAR SPECIAL TESTS:  Single leg stance test: Positive  FUNCTIONAL TESTS:  Squat:no pain, sufficient lumbopelvic mobility    GAIT: Assistive device utilized: None Comments: mild trendelenburg gait pattern with ambulation   POSTURE: rounded shoulders and forward head  LUMBARAROM/PROM: within normal limits   A/PROM A/PROM  eval  Flexion   Extension   Right lateral flexion   Left lateral flexion   Right rotation   Left rotation    (Blank rows = not tested)  LOWER EXTREMITY ROM: within normal limits   Active ROM Right eval Left eval  Hip flexion    Hip extension    Hip abduction    Hip adduction    Hip internal rotation    Hip external rotation    Knee flexion    Knee extension    Ankle dorsiflexion    Ankle plantarflexion    Ankle inversion    Ankle eversion     (Blank rows = not tested)  LOWER EXTREMITY MMT: 4+/5 bilateral hips and knees grossly   MMT Right eval Left eval  Hip flexion    Hip extension    Hip abduction    Hip adduction    Hip internal rotation    Hip external rotation    Knee flexion    Knee extension    Ankle dorsiflexion    Ankle plantarflexion    Ankle inversion    Ankle eversion     (Blank rows = not tested) PALPATION:   General: no significant tension in bilateral adductors/hip flexors   Pelvic Alignment: within normal limits   Abdominal: abdominal bracing at rest, upper chest breathing, decreased rib excursion with inhalation                 External Perineal Exam: minimal dryness noted, no pain with  palpation of superficial musculature, sufficient clitoral hood mobility                              Internal Pelvic Floor: no significant muscle tension noted; no pain with examination  of pelvic floor musculature layers 1/2/3; general lack of coordination between pelvic floor and diaphragm   Patient confirms identification and approves PT to assess internal pelvic floor and treatment Yes No emotional/communication barriers or cognitive limitation. Patient is motivated to learn. Patient understands and agrees with treatment goals and plan. PT explains patient will be examined in standing, sitting, and lying down to see how their muscles and joints work. When they are ready, they will be asked to remove their underwear so PT can examine their perineum. The patient is also given the option of providing their own chaperone as one is not provided in our facility. The patient also has the right and is explained the right to defer or refuse any part of the evaluation or treatment including the internal exam. With the patient's consent, PT will use one gloved finger to gently assess the muscles of the pelvic floor, seeing how well it contracts and relaxes and if there is muscle symmetry. After, the patient will get dressed and PT and patient will discuss exam findings and plan of care. PT and patient discuss plan of care, schedule, attendance policy and HEP activities.  PELVIC MMT:   MMT eval  Vaginal 3/5, 3 sec hold, 10 quick flicks   Internal Anal Sphincter   External Anal Sphincter   Puborectalis   Diastasis Recti   (Blank rows = not tested)  TONE: Within normal limits   PROLAPSE: N/A   TODAY'S TREATMENT:                                                                                                                              DATE:   EVAL 07/12/23: Examination completed, findings reviewed, pt educated on POC, HEP, and self care. Pt motivated to participate in PT and agreeable to attempt recommendations.  Neuro re-ed: Hooklying diaphragmatic breathing + pelvic floor musculature range of motion with inhalation/exhalation 3x10 breaths  Hooklying pelvic floor quick flicks +  diaphragmatic breathing 2x10  Self care: Relative anatomy, connection between the pelvic floor and the diaphragm, pelvic floor active range of motion, vaginal moisturizers   08/23/23: Neuro re-ed: seated diaphragmatic breathing + pelvic floor musculature range of motion with inhalation/exhalation 3x10 breaths  seated pelvic floor quick flicks + diaphragmatic breathing 2x10  Clamshell (GTB around knees) + reverse clamshell + diaphragmatic breathing 2x12 Bridge + ball squeeze + diaphragmatic breathing 2x10  Sit to stand + ball squeeze + diaphragmatic breathing 2x10  Hooklying alternating march + GTB + diaphragmatic breathing 2x10 alternating  Therapeutic exercise: Supine butterfly stretch + diaphragmatic breathing 2x35min  Childs pose + diaphragmatic breathing 2x6min  Pretzel piriformis stretch + diaphragmatic breathing 2x28min each  side  Self care: Relative anatomy, connection between the pelvic floor and the diaphragm, pelvic floor active range of motion, vaginal moisturizers Knack technique for stress incontinence, urge drill for urge urinary incontinence   09/06/23: Neuro re-ed: seated diaphragmatic breathing + pelvic floor musculature range of motion with inhalation/exhalation 3x10 breaths  seated pelvic floor quick flicks + diaphragmatic breathing 2x10  Sumo squat holding 25# kettlebell + diaphragmatic breathing 2x10  Box jump down from 2" step + diaphragmatic breathing 2x10  Bird dog + diaphragmatic breathing 2x20 alternating  Therapeutic exercise: Supine butterfly stretch + diaphragmatic breathing 2x70min  Childs pose + diaphragmatic breathing 2x73min  Pretzel piriformis stretch + diaphragmatic breathing 2x75min each side  Standing hamstring stretch + diaphragmatic breathing 2x8min  Self care: Relative anatomy, connection between the pelvic floor and the diaphragm, pelvic floor active range of motion, vaginal moisturizers Knack technique for stress incontinence, urge drill for urge  urinary incontinence   PATIENT EDUCATION:  Education details: Relative anatomy, connection between the pelvic floor and the diaphragm, pelvic floor active range of motion, vaginal moisturizers  Person educated: Patient Education method: Explanation, Demonstration, Tactile cues, Verbal cues, and Handouts Education comprehension: verbalized understanding, returned demonstration, verbal cues required, and tactile cues required  HOME EXERCISE PROGRAM: Access Code: [email or web address] URL: [email or web address] Date: 09/06/2023 Prepared by: Robbin Chill  Exercises - Seated Pelvic Floor Contraction  - 1 x daily - 7 x weekly - 1 sets - 10 reps - Seated Quick Flick Pelvic Floor Contractions  - 1 x daily - 7 x weekly - 1 sets - 10-15 reps - Sumo Squat with Dumbbell  - 1 x daily - 7 x weekly - 2 sets - 12 reps - Jump Downs with Hip Abduction  - 1 x daily - 7 x weekly - 2 sets - 10 reps - Reverse Chop with Elbows Straight  - 1 x daily - 7 x weekly - 2 sets - 10 reps - Bird Dog  - 1 x daily - 7 x weekly - 2 sets - 20 reps - Supine Butterfly Groin Stretch  - 1 x daily - 7 x weekly - 2 sets -  hold - Supine Figure 4 Piriformis Stretch  - 1 x daily - 7 x weekly - 2 sets - hold - Child's Pose Stretch  - 1 x daily - 7 x weekly - 2 sets - hold  ASSESSMENT:  CLINICAL IMPRESSION: Patient is a 53 y.o. female  who was seen today for physical therapy treatment for stress incontinence. Patient has had no instances of leakage since last visit, but she still hasn't been running much. Pelvic floor training exercises progressed today to involve more gravitational load to the pelvic floor during breathing techniques. Addition of jumping loaded exercises went very well and patient had no issues with this at all - no leakage to report whatsoever. No pain during or after today's treatment session. Pt would benefit from additional PT to further address deficits.    OBJECTIVE IMPAIRMENTS: decreased  coordination, decreased endurance, decreased mobility, decreased ROM, and decreased strength.   ACTIVITY LIMITATIONS: continence  PARTICIPATION LIMITATIONS:  running for long distances  PERSONAL FACTORS: Past/current experiences and Time since onset of injury/illness/exacerbation are also affecting patient's functional outcome.   REHAB POTENTIAL: Good  CLINICAL DECISION MAKING: Stable/uncomplicated  EVALUATION COMPLEXITY: Low   GOALS: Goals reviewed with patient? Yes  SHORT TERM GOALS: Target date: 08/09/2023  Pt will be independent with HEP.  Baseline: Goal status: INITIAL  2.  Pt will be independent with diaphragmatic breathing and down training activities in order to improve pelvic floor relaxation.  Baseline:  Goal status: INITIAL  3.  Pt will be independent with the knack, urge suppression technique, and double voiding in order to improve bladder habits and decrease urinary incontinence.   Baseline:  Goal status: INITIAL  LONG TERM GOALS: Target date: 01/12/2024  Pt will be independent with advanced HEP.  Baseline:  Goal status: INITIAL  2.  Pt to demonstrate improved coordination of pelvic floor and breathing mechanics with 10# squat with appropriate synergistic patterns to decrease pain and leakage at least 75% of the time.    Baseline:  Goal status: INITIAL  3.  Pt to demonstrate at least 4+/5 pelvic floor strength for improved pelvic stability and decreased strain at pelvic floor/ decrease leakage.   Baseline:  Goal status: INITIAL  4.  Pt will be able to run/workout for 30 minutes without leakage or discomfort to improve quality of life and stress urinary incontinence. Baseline:  Goal status: INITIAL  PLAN:  PT FREQUENCY: 1x/week  PT DURATION: 8 weeks  PLANNED INTERVENTIONS: 97110-Therapeutic exercises, 97530- Therapeutic activity, 97112- Neuromuscular re-education, 97535- Self Care, 16109- Manual therapy, Taping, Dry Needling, Joint mobilization, Spinal  mobilization, Scar mobilization, Cryotherapy, and Moist heat  PLAN FOR NEXT SESSION: continued pelvic floor AROM training to seated/standing, introduce hip strengthening and progressive pelvic floor training with emphasis on diaphragmatic breathing   Marni Sins, PT 09/06/2023, 4:49 PM

## 2023-09-13 ENCOUNTER — Ambulatory Visit: Attending: Obstetrics and Gynecology | Admitting: Physical Therapy

## 2023-09-13 DIAGNOSIS — M6281 Muscle weakness (generalized): Secondary | ICD-10-CM | POA: Diagnosis not present

## 2023-09-13 DIAGNOSIS — R279 Unspecified lack of coordination: Secondary | ICD-10-CM | POA: Diagnosis not present

## 2023-09-13 NOTE — Therapy (Signed)
 OUTPATIENT PHYSICAL THERAPY FEMALE PELVIC TREATMENT   Patient Name: Chelsea Flores MRN: [id number] DOB:1970-06-27, 53 y.o., female Today's Date: 09/13/2023  END OF SESSION:  PT End of Session - 09/13/23 0929     Visit Number 5    Number of Visits 8    Date for PT Re-Evaluation 08/09/23    Authorization Type [employer]    PT Start Time 0845    PT Stop Time 0930    PT Time Calculation (min) 45 min    Activity Tolerance Patient tolerated treatment well    Behavior During Therapy WFL for tasks assessed/performed                 Past Medical History:  Diagnosis Date   Anxiety    Asthma    Back pain    Complication of anesthesia    Depression    Dyspnea    asthma flaring   Dysrhythmia    Headache    Insomnia    Palpitations    PONV (postoperative nausea and vomiting)    PVC (premature ventricular contraction)    Snores    Tenosynovitis of thumb    Vertigo    Past Surgical History:  Procedure Laterality Date   CESAREAN SECTION     cist     removed from thumb   TUBAL LIGATION     Patient Active Problem List   Diagnosis Date Noted   Family history of thromboembolic disease 62/95/2841   Tremor 04/05/2023   Chronic migraine without aura without status migrainosus, not intractable 11/30/2021   Left foot pain 01/09/2016   Hip pain 04/13/2012    PCP: Margarete Sharps, MD  REFERRING PROVIDER: Thora Flint, MD   REFERRING DIAG: [phone] (ICD-10-CM) - Incontinence overflow, stress female  THERAPY DIAG:  Muscle weakness (generalized)  Unspecified lack of coordination  Rationale for Evaluation and Treatment: Rehabilitation  ONSET DATE: 2018  SUBJECTIVE:                                                                                                                                                                                           SUBJECTIVE STATEMENT: Patient felt great after last session, she was able to run 1 mile at home on the treadmill with  no leakage. The next day, she tried to run 1 mile again and patient started leaking small amounts early on in the run (2/10ths of a mile) - she had to shower after. She has ran 5 times total since last visit. Her 3rd run was fine with no leakage. With her 4th run, she felt like she might leak, but at the end  she was dry.  5th run was also dry. No leakage during jumping exercises.   From Eval: Patient reports to PT with urinary incontinence "at times" - When training for a half marathon in 2018 she began noticing incontinence issues which has continued to progress. For example, recently patient had the flu and was coughing a lot during this time, therefore she experienced more leakage than normal. Patient is able to run a few miles before leakage begins while running. Fluid intake: earl grey tea in the mornings, 1-1.5 liters of water per day; when migraines are not present, 2+ liters; flavored club soda in the evenings occasionally   PAIN:  Are you having pain? No NPRS scale: 0/10  PRECAUTIONS: None  RED FLAGS: None   WEIGHT BEARING RESTRICTIONS: No  FALLS:  Has patient fallen in last 6 months? 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   ACTIVITY LEVEL : not running currently due to leakage, wishes to do another half marathon; youtube weightlifting videos   PLOF: Independent  PATIENT GOALS: return back to running without leakage, to do exercises independently   PERTINENT HISTORY:  2 c-section, gallbladder removal  Sexual abuse: No  BOWEL MOVEMENT: Pain with bowel movement: No Type of bowel movement:Type (Bristol Stool Scale) 2, Frequency 1x/day usually, Strain no, and Splinting yes on occasion Fully empty rectum: Yes:   Leakage: No Pads: No Fiber supplement/laxative No  URINATION: Pain with urination: No Fully empty bladder: Yes:   Stream: Strong Urgency: Yes  Frequency: within normal limits Leakage: Coughing and Sneezing Pads: No  INTERCOURSE: when husband is  in town, yes   Ability to have vaginal penetration Yes  Pain with intercourse: Initial Penetration, During Penetration, Deep Penetration, and After Intercourse DrynessNo Climax: yes, no pain  Marinoff Scale: 0/3 No lubricant  PREGNANCY: Vaginal deliveries 0 C-section deliveries 2 (2000, 2003) Currently pregnant No  PROLAPSE: None  OBJECTIVE:  Note: Objective measures were completed at Evaluation unless otherwise noted.  PATIENT SURVEYS:  PFIQ-7: 10  COGNITION: Overall cognitive status: Within functional limits for tasks assessed     SENSATION: Light touch: Appears intact  LUMBAR SPECIAL TESTS:  Single leg stance test: Positive  FUNCTIONAL TESTS:  Squat:no pain, sufficient lumbopelvic mobility    GAIT: Assistive device utilized: None Comments: mild trendelenburg gait pattern with ambulation   POSTURE: rounded shoulders and forward head  LUMBARAROM/PROM: within normal limits   A/PROM A/PROM  eval  Flexion   Extension   Right lateral flexion   Left lateral flexion   Right rotation   Left rotation    (Blank rows = not tested)  LOWER EXTREMITY ROM: within normal limits   Active ROM Right eval Left eval  Hip flexion    Hip extension    Hip abduction    Hip adduction    Hip internal rotation    Hip external rotation    Knee flexion    Knee extension    Ankle dorsiflexion    Ankle plantarflexion    Ankle inversion    Ankle eversion     (Blank rows = not tested)  LOWER EXTREMITY MMT: 4+/5 bilateral hips and knees grossly   MMT Right eval Left eval  Hip flexion    Hip extension    Hip abduction    Hip adduction    Hip internal rotation    Hip external rotation    Knee flexion    Knee extension    Ankle dorsiflexion  Ankle plantarflexion    Ankle inversion    Ankle eversion     (Blank rows = not tested) PALPATION:   General: no significant tension in bilateral adductors/hip flexors   Pelvic Alignment: within normal limits    Abdominal: abdominal bracing at rest, upper chest breathing, decreased rib excursion with inhalation                 External Perineal Exam: minimal dryness noted, no pain with palpation of superficial musculature, sufficient clitoral hood mobility                              Internal Pelvic Floor: no significant muscle tension noted; no pain with examination of pelvic floor musculature layers 1/2/3; general lack of coordination between pelvic floor and diaphragm   Patient confirms identification and approves PT to assess internal pelvic floor and treatment Yes No emotional/communication barriers or cognitive limitation. Patient is motivated to learn. Patient understands and agrees with treatment goals and plan. PT explains patient will be examined in standing, sitting, and lying down to see how their muscles and joints work. When they are ready, they will be asked to remove their underwear so PT can examine their perineum. The patient is also given the option of providing their own chaperone as one is not provided in our facility. The patient also has the right and is explained the right to defer or refuse any part of the evaluation or treatment including the internal exam. With the patient's consent, PT will use one gloved finger to gently assess the muscles of the pelvic floor, seeing how well it contracts and relaxes and if there is muscle symmetry. After, the patient will get dressed and PT and patient will discuss exam findings and plan of care. PT and patient discuss plan of care, schedule, attendance policy and HEP activities.  PELVIC MMT:   MMT eval  Vaginal 3/5, 3 sec hold, 10 quick flicks   Internal Anal Sphincter   External Anal Sphincter   Puborectalis   Diastasis Recti   (Blank rows = not tested)  TONE: Within normal limits   PROLAPSE: N/A   TODAY'S TREATMENT:                                                                                                                               DATE:   08/23/23: Neuro re-ed: seated diaphragmatic breathing + pelvic floor musculature range of motion with inhalation/exhalation 3x10 breaths  seated pelvic floor quick flicks + diaphragmatic breathing 2x10  Clamshell (GTB around knees) + reverse clamshell + diaphragmatic breathing 2x12 Bridge + ball squeeze + diaphragmatic breathing 2x10  Sit to stand + ball squeeze + diaphragmatic breathing 2x10  Hooklying alternating march + GTB + diaphragmatic breathing 2x10 alternating  Therapeutic exercise: Supine butterfly stretch + diaphragmatic breathing 2x39min  Childs pose + diaphragmatic breathing 2x66min  Pretzel piriformis stretch +  diaphragmatic breathing 2x71min each side  Self care: Relative anatomy, connection between the pelvic floor and the diaphragm, pelvic floor active range of motion, vaginal moisturizers Knack technique for stress incontinence, urge drill for urge urinary incontinence   09/06/23: Neuro re-ed: seated diaphragmatic breathing + pelvic floor musculature range of motion with inhalation/exhalation 3x10 breaths  seated pelvic floor quick flicks + diaphragmatic breathing 2x10  Sumo squat holding 25# kettlebell + diaphragmatic breathing 2x10  Box jump down from 2" step + diaphragmatic breathing 2x10  Bird dog + diaphragmatic breathing 2x20 alternating  Therapeutic exercise: Supine butterfly stretch + diaphragmatic breathing 2x47min  Childs pose + diaphragmatic breathing 2x87min  Pretzel piriformis stretch + diaphragmatic breathing 2x22min each side  Standing hamstring stretch + diaphragmatic breathing 2x64min  Self care: Relative anatomy, connection between the pelvic floor and the diaphragm, pelvic floor active range of motion, vaginal moisturizers Knack technique for stress incontinence, urge drill for urge urinary incontinence   09/13/23: Neuro re-ed: seated diaphragmatic breathing + pelvic floor musculature range of motion with inhalation/exhalation 3x10  breaths  seated pelvic floor quick flicks + diaphragmatic breathing 2x10  Sumo squat holding 25# kettlebell + diaphragmatic breathing 2x10  Box jump up to 2" step + diaphragmatic breathing 2x10  Bird dog knee taps holding 3# weight + diaphragmatic breathing 2x20 alternating  Therapeutic exercise: Nustep L5 6 mins- PT present to discuss current status  Reverse lunges holding 10# KB + diaphragmatic breathing 2x10 each  Lateral lunge onto bosu + diaphragmatic breathing 2x10 each  Overhead press + alternating march + diaphragmatic breathing (3# dumbbells) 2x10  Supine butterfly stretch + diaphragmatic breathing 2x80min  Childs pose + diaphragmatic breathing 2x35min  Pretzel piriformis stretch + diaphragmatic breathing 2x77min each side  Standing hamstring stretch + diaphragmatic breathing 2x91min  Self care: Relative anatomy, connection between the pelvic floor and the diaphragm, pelvic floor active range of motion, vaginal moisturizers Knack technique for stress incontinence, urge drill for urge urinary incontinence   PATIENT EDUCATION:  Education details: Relative anatomy, connection between the pelvic floor and the diaphragm, pelvic floor active range of motion, vaginal moisturizers  Person educated: Patient Education method: Explanation, Demonstration, Tactile cues, Verbal cues, and Handouts Education comprehension: verbalized understanding, returned demonstration, verbal cues required, and tactile cues required  HOME EXERCISE PROGRAM: Access Code: [email or web address] URL: [email or web address] Date: 09/13/2023 Prepared by: Robbin Chill  Exercises - Seated Pelvic Floor Contraction  - 1 x daily - 7 x weekly - 1 sets - 10 reps - Seated Quick Flick Pelvic Floor Contractions  - 1 x daily - 7 x weekly - 1 sets - 10-15 reps - Bird Dog with Knee Taps  - 1 x daily - 7 x weekly - 2 sets - 16 reps - Reverse Lunge  - 1 x daily - 7 x weekly - 2 sets - 10 reps - Lateral Lunge  - 1 x daily - 7 x  weekly - 2 sets - 12 reps - Standing March with Alternating Med [facility]  - 1 x daily - 7 x weekly - 2 sets - 20 reps - Box Jump  - 1 x daily - 7 x weekly - 2 sets - 10 reps - Sumo Squat with Dumbbell  - 1 x daily - 7 x weekly - 2 sets - 12 reps - Supine Butterfly Groin Stretch  - 1 x daily - 7 x weekly - 2 sets -  hold - Supine Figure 4 Piriformis Stretch  - 1 x daily -  7 x weekly - 2 sets - hold - Child's Pose Stretch  - 1 x daily - 7 x weekly - 2 sets - hold  ASSESSMENT:  CLINICAL IMPRESSION: Patient is a 53 y.o. female  who was seen today for physical therapy treatment for stress incontinence. Patient leaked on her 2nd 2 mile run this week, but only leaked 1/5 runs in the past week. Pelvic floor training exercises progressed today to involve more gravitational load to the pelvic floor during breathing techniques. Progression of jumping loaded exercises went very well and patient had no issues with this at all - no leakage to report whatsoever. No pain during or after today's treatment session. Pt would benefit from additional PT to further address deficits.    OBJECTIVE IMPAIRMENTS: decreased coordination, decreased endurance, decreased mobility, decreased ROM, and decreased strength.   ACTIVITY LIMITATIONS: continence  PARTICIPATION LIMITATIONS:  running for long distances  PERSONAL FACTORS: Past/current experiences and Time since onset of injury/illness/exacerbation are also affecting patient's functional outcome.   REHAB POTENTIAL: Good  CLINICAL DECISION MAKING: Stable/uncomplicated  EVALUATION COMPLEXITY: Low   GOALS: Goals reviewed with patient? Yes  SHORT TERM GOALS: Target date: 08/09/2023  Pt will be independent with HEP.  Baseline: Goal status: INITIAL  2.  Pt will be independent with diaphragmatic breathing and down training activities in order to improve pelvic floor relaxation.  Baseline:  Goal status: INITIAL  3.  Pt will be independent with  the knack, urge suppression technique, and double voiding in order to improve bladder habits and decrease urinary incontinence.   Baseline:  Goal status: INITIAL  LONG TERM GOALS: Target date: 01/12/2024  Pt will be independent with advanced HEP.  Baseline:  Goal status: INITIAL  2.  Pt to demonstrate improved coordination of pelvic floor and breathing mechanics with 10# squat with appropriate synergistic patterns to decrease pain and leakage at least 75% of the time.    Baseline:  Goal status: INITIAL  3.  Pt to demonstrate at least 4+/5 pelvic floor strength for improved pelvic stability and decreased strain at pelvic floor/ decrease leakage.   Baseline:  Goal status: INITIAL  4.  Pt will be able to run/workout for 30 minutes without leakage or discomfort to improve quality of life and stress urinary incontinence. Baseline:  Goal status: INITIAL  PLAN:  PT FREQUENCY: 1x/week  PT DURATION: 8 weeks  PLANNED INTERVENTIONS: 97110-Therapeutic exercises, 97530- Therapeutic activity, 97112- Neuromuscular re-education, 97535- Self Care, 13086- Manual therapy, Taping, Dry Needling, Joint mobilization, Spinal mobilization, Scar mobilization, Cryotherapy, and Moist heat  PLAN FOR NEXT SESSION: continued pelvic floor AROM training to seated/standing, introduce hip strengthening and progressive pelvic floor training with emphasis on diaphragmatic breathing   Marni Sins, PT 09/13/2023, 9:30 AM

## 2023-09-15 ENCOUNTER — Encounter: Payer: Self-pay | Admitting: Oncology

## 2023-09-20 ENCOUNTER — Ambulatory Visit: Admitting: Physical Therapy

## 2023-09-20 DIAGNOSIS — M6281 Muscle weakness (generalized): Secondary | ICD-10-CM | POA: Diagnosis not present

## 2023-09-20 DIAGNOSIS — R279 Unspecified lack of coordination: Secondary | ICD-10-CM

## 2023-09-20 NOTE — Therapy (Signed)
 OUTPATIENT PHYSICAL THERAPY FEMALE PELVIC TREATMENT   Patient Name: Chelsea Flores MRN: [id number] DOB:1970-11-11, 53 y.o., female Today's Date: 09/20/2023  END OF SESSION:  PT End of Session - 09/20/23 0930     Visit Number 6    Number of Visits 8    Date for PT Re-Evaluation 08/09/23    Authorization Type [employer]    PT Start Time 0845    PT Stop Time 0930    PT Time Calculation (min) 45 min    Activity Tolerance Patient tolerated treatment well    Behavior During Therapy WFL for tasks assessed/performed                  Past Medical History:  Diagnosis Date   Anxiety    Asthma    Back pain    Complication of anesthesia    Depression    Dyspnea    asthma flaring   Dysrhythmia    Headache    Insomnia    Palpitations    PONV (postoperative nausea and vomiting)    PVC (premature ventricular contraction)    Snores    Tenosynovitis of thumb    Vertigo    Past Surgical History:  Procedure Laterality Date   CESAREAN SECTION     cist     removed from thumb   TUBAL LIGATION     Patient Active Problem List   Diagnosis Date Noted   Family history of thromboembolic disease 40/98/1191   Tremor 04/05/2023   Chronic migraine without aura without status migrainosus, not intractable 11/30/2021   Left foot pain 01/09/2016   Hip pain 04/13/2012    PCP: Margarete Sharps, MD  REFERRING PROVIDER: Thora Flint, MD   REFERRING DIAG: [phone] (ICD-10-CM) - Incontinence overflow, stress female  THERAPY DIAG:  Muscle weakness (generalized)  Unspecified lack of coordination  Rationale for Evaluation and Treatment: Rehabilitation  ONSET DATE: 2018  SUBJECTIVE:                                                                                                                                                                                           SUBJECTIVE STATEMENT: Patient had a busy work week last week. She ran two times last week and was fine with no  leakage, and her last run she ran 1/10th of a mile and her right hip starting bothering her. This is an old injury - she thinks this is due to glute med weakness. She had some soreness in her inner thighs after last visit.   From Eval: Patient reports to PT with urinary incontinence "at times" - When training for a half  marathon in 2018 she began noticing incontinence issues which has continued to progress. For example, recently patient had the flu and was coughing a lot during this time, therefore she experienced more leakage than normal. Patient is able to run a few miles before leakage begins while running. Fluid intake: earl grey tea in the mornings, 1-1.5 liters of water per day; when migraines are not present, 2+ liters; flavored club soda in the evenings occasionally   PAIN:  Are you having pain? No NPRS scale: 0/10  PRECAUTIONS: None  RED FLAGS: None   WEIGHT BEARING RESTRICTIONS: No  FALLS:  Has patient fallen in last 6 months? 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   ACTIVITY LEVEL : not running currently due to leakage, wishes to do another half marathon; youtube weightlifting videos   PLOF: Independent  PATIENT GOALS: return back to running without leakage, to do exercises independently   PERTINENT HISTORY:  2 c-section, gallbladder removal  Sexual abuse: No  BOWEL MOVEMENT: Pain with bowel movement: No Type of bowel movement:Type (Bristol Stool Scale) 2, Frequency 1x/day usually, Strain no, and Splinting yes on occasion Fully empty rectum: Yes:   Leakage: No Pads: No Fiber supplement/laxative No  URINATION: Pain with urination: No Fully empty bladder: Yes:   Stream: Strong Urgency: Yes  Frequency: within normal limits Leakage: Coughing and Sneezing Pads: No  INTERCOURSE: when husband is in town, yes   Ability to have vaginal penetration Yes  Pain with intercourse: Initial Penetration, During Penetration, Deep Penetration, and After  Intercourse DrynessNo Climax: yes, no pain  Marinoff Scale: 0/3 No lubricant  PREGNANCY: Vaginal deliveries 0 C-section deliveries 2 (2000, 2003) Currently pregnant No  PROLAPSE: None  OBJECTIVE:  Note: Objective measures were completed at Evaluation unless otherwise noted.  PATIENT SURVEYS:  PFIQ-7: 10  COGNITION: Overall cognitive status: Within functional limits for tasks assessed     SENSATION: Light touch: Appears intact  LUMBAR SPECIAL TESTS:  Single leg stance test: Positive  FUNCTIONAL TESTS:  Squat:no pain, sufficient lumbopelvic mobility    GAIT: Assistive device utilized: None Comments: mild trendelenburg gait pattern with ambulation   POSTURE: rounded shoulders and forward head  LUMBARAROM/PROM: within normal limits   A/PROM A/PROM  eval  Flexion   Extension   Right lateral flexion   Left lateral flexion   Right rotation   Left rotation    (Blank rows = not tested)  LOWER EXTREMITY ROM: within normal limits   Active ROM Right eval Left eval  Hip flexion    Hip extension    Hip abduction    Hip adduction    Hip internal rotation    Hip external rotation    Knee flexion    Knee extension    Ankle dorsiflexion    Ankle plantarflexion    Ankle inversion    Ankle eversion     (Blank rows = not tested)  LOWER EXTREMITY MMT: 4+/5 bilateral hips and knees grossly   MMT Right eval Left eval  Hip flexion    Hip extension    Hip abduction    Hip adduction    Hip internal rotation    Hip external rotation    Knee flexion    Knee extension    Ankle dorsiflexion    Ankle plantarflexion    Ankle inversion    Ankle eversion     (Blank rows = not tested) PALPATION:   General: no significant tension in bilateral adductors/hip flexors  Pelvic Alignment: within normal limits   Abdominal: abdominal bracing at rest, upper chest breathing, decreased rib excursion with inhalation                 External Perineal Exam: minimal dryness  noted, no pain with palpation of superficial musculature, sufficient clitoral hood mobility                              Internal Pelvic Floor: no significant muscle tension noted; no pain with examination of pelvic floor musculature layers 1/2/3; general lack of coordination between pelvic floor and diaphragm   Patient confirms identification and approves PT to assess internal pelvic floor and treatment Yes No emotional/communication barriers or cognitive limitation. Patient is motivated to learn. Patient understands and agrees with treatment goals and plan. PT explains patient will be examined in standing, sitting, and lying down to see how their muscles and joints work. When they are ready, they will be asked to remove their underwear so PT can examine their perineum. The patient is also given the option of providing their own chaperone as one is not provided in our facility. The patient also has the right and is explained the right to defer or refuse any part of the evaluation or treatment including the internal exam. With the patient's consent, PT will use one gloved finger to gently assess the muscles of the pelvic floor, seeing how well it contracts and relaxes and if there is muscle symmetry. After, the patient will get dressed and PT and patient will discuss exam findings and plan of care. PT and patient discuss plan of care, schedule, attendance policy and HEP activities.  PELVIC MMT:   MMT eval  Vaginal 3/5, 3 sec hold, 10 quick flicks   Internal Anal Sphincter   External Anal Sphincter   Puborectalis   Diastasis Recti   (Blank rows = not tested)  TONE: Within normal limits   PROLAPSE: N/A   TODAY'S TREATMENT:                                                                                                                              DATE:   09/06/23: Neuro re-ed: seated diaphragmatic breathing + pelvic floor musculature range of motion with inhalation/exhalation 3x10 breaths   seated pelvic floor quick flicks + diaphragmatic breathing 2x10  Sumo squat holding 25# kettlebell + diaphragmatic breathing 2x10  Box jump down from 2" step + diaphragmatic breathing 2x10  Bird dog + diaphragmatic breathing 2x20 alternating  Therapeutic exercise: Supine butterfly stretch + diaphragmatic breathing 2x60min  Childs pose + diaphragmatic breathing 2x54min  Pretzel piriformis stretch + diaphragmatic breathing 2x22min each side  Standing hamstring stretch + diaphragmatic breathing 2x7min  Self care: Relative anatomy, connection between the pelvic floor and the diaphragm, pelvic floor active range of motion, vaginal moisturizers Knack technique for stress incontinence, urge drill for urge urinary incontinence  09/13/23: Neuro re-ed: seated diaphragmatic breathing + pelvic floor musculature range of motion with inhalation/exhalation 3x10 breaths  seated pelvic floor quick flicks + diaphragmatic breathing 2x10  Sumo squat holding 25# kettlebell + diaphragmatic breathing 2x10  Box jump up to 2" step + diaphragmatic breathing 2x10  Bird dog knee taps holding 3# weight + diaphragmatic breathing 2x20 alternating  Therapeutic exercise: Nustep L5 6 mins- PT present to discuss current status  Reverse lunges holding 10# KB + diaphragmatic breathing 2x10 each  Lateral lunge onto bosu + diaphragmatic breathing 2x10 each  Overhead press + alternating march + diaphragmatic breathing (3# dumbbells) 2x10  Supine butterfly stretch + diaphragmatic breathing 2x52min  Childs pose + diaphragmatic breathing 2x38min  Pretzel piriformis stretch + diaphragmatic breathing 2x29min each side  Standing hamstring stretch + diaphragmatic breathing 2x65min  Self care: Relative anatomy, connection between the pelvic floor and the diaphragm, pelvic floor active range of motion, vaginal moisturizers Knack technique for stress incontinence, urge drill for urge urinary incontinence   09/20/23: Neuro re-ed: 3  lateral steps into a squat jump 2x4 each direction  Single leg deadlift + diaphragmatic breathing (5# kettlebell) 2x10 each  Sumo squat holding 30# kettlebell + diaphragmatic breathing 2x10  Step ups holding 5# dumbbells + opp knee drive + diaphragmatic breathing 2x8 each  Upside down bosu squats + diaphragmatic breathing 2x10  Upside down bosu mountain climbers + dv 2x10 alternating  Therapeutic exercise: Incline walking 2% 6 mins speed 3.7 - PT present to discuss current status  Reverse lunges holding 10# KB + diaphragmatic breathing 2x10 each  Lateral lunge onto bosu + diaphragmatic breathing 2x10 each  Overhead press + alternating march + diaphragmatic breathing (3# dumbbells) 2x10  Supine butterfly stretch + diaphragmatic breathing 2x94min  Childs pose + diaphragmatic breathing 2x40min  Pretzel piriformis stretch + diaphragmatic breathing 2x62min each side  Standing hamstring stretch + diaphragmatic breathing 2x60min  Self care: Relative anatomy, connection between the pelvic floor and the diaphragm, pelvic floor active range of motion, vaginal moisturizers Knack technique for stress incontinence, urge drill for urge urinary incontinence   PATIENT EDUCATION:  Education details: Relative anatomy, connection between the pelvic floor and the diaphragm, pelvic floor active range of motion, vaginal moisturizers  Person educated: Patient Education method: Explanation, Demonstration, Tactile cues, Verbal cues, and Handouts Education comprehension: verbalized understanding, returned demonstration, verbal cues required, and tactile cues required  HOME EXERCISE PROGRAM: Access Code: [email or web address] URL: [email or web address] Date: 09/20/2023 Prepared by: Robbin Chill  Exercises - Single Leg Deadlift with Kettlebell  - 1 x daily - 7 x weekly - 2 sets - 12 reps - Runner's Climb  - 1 x daily - 7 x weekly - 2 sets - 8 reps - Squat on Flat Side of BOSU  - 1 x daily - 7 x weekly - 2 sets -  10 reps - Plank Curls on BOSU Ball  - 1 x daily - 7 x weekly - 2 sets - 10 reps - Lateral Lunge  - 1 x daily - 7 x weekly - 2 sets - 12 reps - Box Jump  - 1 x daily - 7 x weekly - 2 sets - 10 reps - Sumo Squat with Dumbbell  - 1 x daily - 7 x weekly - 2 sets - 12 reps - Supine Butterfly Groin Stretch  - 1 x daily - 7 x weekly - 2 sets -  hold - Supine Figure 4 Piriformis Stretch  - 1 x daily - 7  x weekly - 2 sets - hold - Child's Pose Stretch  - 1 x daily - 7 x weekly - 2 sets - hold  ASSESSMENT:  CLINICAL IMPRESSION: Patient is a 53 y.o. female  who was seen today for physical therapy treatment for stress incontinence. No leakage to report with running or exercises this week. Pelvic floor training exercises progressed today to involve more gravitational load to the pelvic floor during breathing techniques. Progression of jumping loaded exercises went very well and patient had no issues with this at all - no leakage to report whatsoever. Addition of bosu ball to treatment plan challenged patient's balance and glute med control. No pain during or after today's treatment session. Pt would benefit from additional PT to further address deficits.    OBJECTIVE IMPAIRMENTS: decreased coordination, decreased endurance, decreased mobility, decreased ROM, and decreased strength.   ACTIVITY LIMITATIONS: continence  PARTICIPATION LIMITATIONS: running for long distances  PERSONAL FACTORS: Past/current experiences and Time since onset of injury/illness/exacerbation are also affecting patient's functional outcome.   REHAB POTENTIAL: Good  CLINICAL DECISION MAKING: Stable/uncomplicated  EVALUATION COMPLEXITY: Low   GOALS: Goals reviewed with patient? Yes  SHORT TERM GOALS: Target date: 08/09/2023  Pt will be independent with HEP.  Baseline: Goal status: INITIAL  2.  Pt will be independent with diaphragmatic breathing and down training activities in order to improve pelvic floor  relaxation.  Baseline:  Goal status: INITIAL  3.  Pt will be independent with the knack, urge suppression technique, and double voiding in order to improve bladder habits and decrease urinary incontinence.   Baseline:  Goal status: INITIAL  LONG TERM GOALS: Target date: 01/12/2024  Pt will be independent with advanced HEP.  Baseline:  Goal status: INITIAL  2.  Pt to demonstrate improved coordination of pelvic floor and breathing mechanics with 10# squat with appropriate synergistic patterns to decrease pain and leakage at least 75% of the time.    Baseline:  Goal status: INITIAL  3.  Pt to demonstrate at least 4+/5 pelvic floor strength for improved pelvic stability and decreased strain at pelvic floor/ decrease leakage.   Baseline:  Goal status: INITIAL  4.  Pt will be able to run/workout for 30 minutes without leakage or discomfort to improve quality of life and stress urinary incontinence. Baseline:  Goal status: INITIAL  PLAN:  PT FREQUENCY: 1x/week  PT DURATION: 8 weeks  PLANNED INTERVENTIONS: 97110-Therapeutic exercises, 97530- Therapeutic activity, 97112- Neuromuscular re-education, 97535- Self Care, 27253- Manual therapy, Taping, Dry Needling, Joint mobilization, Spinal mobilization, Scar mobilization, Cryotherapy, and Moist heat  PLAN FOR NEXT SESSION: continued pelvic floor AROM training to seated/standing, introduce hip strengthening and progressive pelvic floor training with emphasis on diaphragmatic breathing   Marni Sins, PT 09/20/2023, 9:30 AM

## 2023-09-27 ENCOUNTER — Ambulatory Visit: Admitting: Physical Therapy

## 2023-09-27 DIAGNOSIS — M6281 Muscle weakness (generalized): Secondary | ICD-10-CM

## 2023-09-27 DIAGNOSIS — R279 Unspecified lack of coordination: Secondary | ICD-10-CM | POA: Diagnosis not present

## 2023-09-27 NOTE — Therapy (Signed)
 OUTPATIENT PHYSICAL THERAPY FEMALE PELVIC TREATMENT   Patient Name: Chelsea Flores MRN: [id number] DOB:Oct 31, 1970, 53 y.o., female Today's Date: 09/27/2023  END OF SESSION:  PT End of Session - 09/27/23 0918     Visit Number 7    Number of Visits 8    Date for PT Re-Evaluation 08/09/23    Authorization Type [employer]    PT Start Time 0845    PT Stop Time 0925    PT Time Calculation (min) 40 min    Activity Tolerance Patient tolerated treatment well    Behavior During Therapy WFL for tasks assessed/performed                   Past Medical History:  Diagnosis Date   Anxiety    Asthma    Back pain    Complication of anesthesia    Depression    Dyspnea    asthma flaring   Dysrhythmia    Headache    Insomnia    Palpitations    PONV (postoperative nausea and vomiting)    PVC (premature ventricular contraction)    Snores    Tenosynovitis of thumb    Vertigo    Past Surgical History:  Procedure Laterality Date   CESAREAN SECTION     cist     removed from thumb   TUBAL LIGATION     Patient Active Problem List   Diagnosis Date Noted   Family history of thromboembolic disease 56/21/3086   Tremor 04/05/2023   Chronic migraine without aura without status migrainosus, not intractable 11/30/2021   Left foot pain 01/09/2016   Hip pain 04/13/2012    PCP: Margarete Sharps, MD  REFERRING PROVIDER: Thora Flint, MD   REFERRING DIAG: [id number] (ICD-10-CM) - Incontinence overflow, stress female  THERAPY DIAG:  Muscle weakness (generalized)  Unspecified lack of coordination  Rationale for Evaluation and Treatment: Rehabilitation  ONSET DATE: 2018  SUBJECTIVE:                                                                                                                                                                                           SUBJECTIVE STATEMENT: Patient reports that she has been busy at work, hasn't been able to complete HEP as much as  she would like. She has had no instances of stress incontinence since last visit, even with yard work this weekend, and no pain to report. She tried jumping onto a 16" box at home and she had no leakage with this, which surprised her.  From Eval: Patient reports to PT with urinary incontinence "at times" - When training for a half marathon  in 2018 she began noticing incontinence issues which has continued to progress. For example, recently patient had the flu and was coughing a lot during this time, therefore she experienced more leakage than normal. Patient is able to run a few miles before leakage begins while running. Fluid intake: earl grey tea in the mornings, 1-1.5 liters of water per day; when migraines are not present, 2+ liters; flavored club soda in the evenings occasionally   PAIN:  Are you having pain? No NPRS scale: 0/10  PRECAUTIONS: None  RED FLAGS: None   WEIGHT BEARING RESTRICTIONS: No  FALLS:  Has patient fallen in last 6 months? 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   ACTIVITY LEVEL : not running currently due to leakage, wishes to do another half marathon; youtube weightlifting videos   PLOF: Independent  PATIENT GOALS: return back to running without leakage, to do exercises independently   PERTINENT HISTORY:  2 c-section, gallbladder removal  Sexual abuse: No  BOWEL MOVEMENT: Pain with bowel movement: No Type of bowel movement:Type (Bristol Stool Scale) 2, Frequency 1x/day usually, Strain no, and Splinting yes on occasion Fully empty rectum: Yes:   Leakage: No Pads: No Fiber supplement/laxative No  URINATION: Pain with urination: No Fully empty bladder: Yes:   Stream: Strong Urgency: Yes  Frequency: within normal limits Leakage: Coughing and Sneezing Pads: No  INTERCOURSE: when husband is in town, yes   Ability to have vaginal penetration Yes  Pain with intercourse: Initial Penetration, During Penetration, Deep Penetration, and After  Intercourse DrynessNo Climax: yes, no pain  Marinoff Scale: 0/3 No lubricant  PREGNANCY: Vaginal deliveries 0 C-section deliveries 2 (2000, 2003) Currently pregnant No  PROLAPSE: None  OBJECTIVE:  Note: Objective measures were completed at Evaluation unless otherwise noted.  PATIENT SURVEYS:  PFIQ-7: 10  COGNITION: Overall cognitive status: Within functional limits for tasks assessed     SENSATION: Light touch: Appears intact  LUMBAR SPECIAL TESTS:  Single leg stance test: Positive  FUNCTIONAL TESTS:  Squat:no pain, sufficient lumbopelvic mobility    GAIT: Assistive device utilized: None Comments: mild trendelenburg gait pattern with ambulation   POSTURE: rounded shoulders and forward head  LUMBARAROM/PROM: within normal limits   A/PROM A/PROM  eval  Flexion   Extension   Right lateral flexion   Left lateral flexion   Right rotation   Left rotation    (Blank rows = not tested)  LOWER EXTREMITY ROM: within normal limits   Active ROM Right eval Left eval  Hip flexion    Hip extension    Hip abduction    Hip adduction    Hip internal rotation    Hip external rotation    Knee flexion    Knee extension    Ankle dorsiflexion    Ankle plantarflexion    Ankle inversion    Ankle eversion     (Blank rows = not tested)  LOWER EXTREMITY MMT: 4+/5 bilateral hips and knees grossly   MMT Right eval Left eval  Hip flexion    Hip extension    Hip abduction    Hip adduction    Hip internal rotation    Hip external rotation    Knee flexion    Knee extension    Ankle dorsiflexion    Ankle plantarflexion    Ankle inversion    Ankle eversion     (Blank rows = not tested) PALPATION:   General: no significant tension in bilateral adductors/hip flexors  Pelvic Alignment: within normal limits   Abdominal: abdominal bracing at rest, upper chest breathing, decreased rib excursion with inhalation                 External Perineal Exam: minimal dryness  noted, no pain with palpation of superficial musculature, sufficient clitoral hood mobility                              Internal Pelvic Floor: no significant muscle tension noted; no pain with examination of pelvic floor musculature layers 1/2/3; general lack of coordination between pelvic floor and diaphragm   Patient confirms identification and approves PT to assess internal pelvic floor and treatment Yes No emotional/communication barriers or cognitive limitation. Patient is motivated to learn. Patient understands and agrees with treatment goals and plan. PT explains patient will be examined in standing, sitting, and lying down to see how their muscles and joints work. When they are ready, they will be asked to remove their underwear so PT can examine their perineum. The patient is also given the option of providing their own chaperone as one is not provided in our facility. The patient also has the right and is explained the right to defer or refuse any part of the evaluation or treatment including the internal exam. With the patient's consent, PT will use one gloved finger to gently assess the muscles of the pelvic floor, seeing how well it contracts and relaxes and if there is muscle symmetry. After, the patient will get dressed and PT and patient will discuss exam findings and plan of care. PT and patient discuss plan of care, schedule, attendance policy and HEP activities.  PELVIC MMT:   MMT eval  Vaginal 3/5, 3 sec hold, 10 quick flicks   Internal Anal Sphincter   External Anal Sphincter   Puborectalis   Diastasis Recti   (Blank rows = not tested)  TONE: Within normal limits   PROLAPSE: N/A   TODAY'S TREATMENT:                                                                                                                              DATE:   09/13/23: Neuro re-ed: seated diaphragmatic breathing + pelvic floor musculature range of motion with inhalation/exhalation 3x10 breaths   seated pelvic floor quick flicks + diaphragmatic breathing 2x10  Sumo squat holding 25# kettlebell + diaphragmatic breathing 2x10  Box jump up to 2" step + diaphragmatic breathing 2x10  Bird dog knee taps holding 3# weight + diaphragmatic breathing 2x20 alternating  Therapeutic exercise: Nustep L5 6 mins- PT present to discuss current status  Reverse lunges holding 10# KB + diaphragmatic breathing 2x10 each  Lateral lunge onto bosu + diaphragmatic breathing 2x10 each  Overhead press + alternating march + diaphragmatic breathing (3# dumbbells) 2x10  Supine butterfly stretch + diaphragmatic breathing 2x26min  Childs pose + diaphragmatic breathing 2x4min  Pretzel piriformis  stretch + diaphragmatic breathing 2x25min each side  Standing hamstring stretch + diaphragmatic breathing 2x75min  Self care: Relative anatomy, connection between the pelvic floor and the diaphragm, pelvic floor active range of motion, vaginal moisturizers Knack technique for stress incontinence, urge drill for urge urinary incontinence   09/20/23: Neuro re-ed: 3 lateral steps into a squat jump 2x4 each direction  Single leg deadlift + diaphragmatic breathing (5# kettlebell) 2x10 each  Sumo squat holding 30# kettlebell + diaphragmatic breathing 2x10  Step ups holding 5# dumbbells + opp knee drive + diaphragmatic breathing 2x8 each  Upside down bosu squats + diaphragmatic breathing 2x10  Upside down bosu mountain climbers + dv 2x10 alternating  Therapeutic exercise: Incline walking 2% 6 mins speed 3.7 - PT present to discuss current status  Reverse lunges holding 10# KB + diaphragmatic breathing 2x10 each  Lateral lunge onto bosu + diaphragmatic breathing 2x10 each  Overhead press + alternating march + diaphragmatic breathing (3# dumbbells) 2x10  Supine butterfly stretch + diaphragmatic breathing 2x66min  Childs pose + diaphragmatic breathing 2x64min  Pretzel piriformis stretch + diaphragmatic breathing 2x61min each side   Standing hamstring stretch + diaphragmatic breathing 2x89min  Self care: Relative anatomy, connection between the pelvic floor and the diaphragm, pelvic floor active range of motion, vaginal moisturizers Knack technique for stress incontinence, urge drill for urge urinary incontinence  09/27/23: Neuro re-ed: Single leg deadlift + diaphragmatic breathing (15# kettlebell) 2x10 each  Upside down bosu squats + diaphragmatic breathing 3x10 +  5# kettlebell Upside down bosu mountain climbers + dv 2x10 alternating  Therapeutic exercise: Incline walking 2% 6 mins speed 3.8 - PT present to discuss current status  Lateral lunge onto bosu + diaphragmatic breathing 2x10 each  Overhead press + alternating march + diaphragmatic breathing (3# dumbbells) 2x10  Supine butterfly stretch + diaphragmatic breathing 2x75min  Childs pose + diaphragmatic breathing 2x43min  Pretzel piriformis stretch + diaphragmatic breathing 2x19min each side  Standing hamstring stretch + diaphragmatic breathing 2x76min  Self care: Relative anatomy, connection between the pelvic floor and the diaphragm, pelvic floor active range of motion, vaginal moisturizers Knack technique for stress incontinence, urge drill for urge urinary incontinence    PATIENT EDUCATION:  Education details: Relative anatomy, connection between the pelvic floor and the diaphragm, pelvic floor active range of motion, vaginal moisturizers  Person educated: Patient Education method: Explanation, Demonstration, Tactile cues, Verbal cues, and Handouts Education comprehension: verbalized understanding, returned demonstration, verbal cues required, and tactile cues required  HOME EXERCISE PROGRAM: Access Code: [email or web address] URL: [email or web address] Date: 09/27/2023 Prepared by: Robbin Chill  Exercises - Single Leg Deadlift with Kettlebell  - 1 x daily - 7 x weekly - 2 sets - 12 reps - Runner's Climb  - 1 x daily - 7 x weekly - 2 sets - 8 reps -  Squat on Flat Side of BOSU  - 1 x daily - 7 x weekly - 2 sets - 10 reps - Plank Curls on BOSU Ball  - 1 x daily - 7 x weekly - 2 sets - 10 reps - Lateral Lunge  - 1 x daily - 7 x weekly - 2 sets - 12 reps - Box Jump  - 1 x daily - 7 x weekly - 2 sets - 10 reps - Sumo Squat with Dumbbell  - 1 x daily - 7 x weekly - 2 sets - 12 reps - Supine Butterfly Groin Stretch  - 1 x daily - 7 x weekly - 2 sets -   hold - Supine Figure 4 Piriformis Stretch  - 1 x daily - 7 x weekly - 2 sets - hold - Child's Pose Stretch  - 1 x daily - 7 x weekly - 2 sets - hold  ASSESSMENT:  CLINICAL IMPRESSION: Patient is a 53 y.o. female  who was seen today for physical therapy treatment for stress incontinence. No leakage to report with running or exercises this week. Pelvic floor training exercises reviewed today since patient hasn't had much time to complete her HEP. Patient purchased a bosu ball so we reviewed more exercises involving this equipment. No pain or urinary leakage during or after today's treatment session. Pt would benefit from additional PT to further address deficits.    OBJECTIVE IMPAIRMENTS: decreased coordination, decreased endurance, decreased mobility, decreased ROM, and decreased strength.   ACTIVITY LIMITATIONS: continence  PARTICIPATION LIMITATIONS: running for long distances  PERSONAL FACTORS: Past/current experiences and Time since onset of injury/illness/exacerbation are also affecting patient's functional outcome.   REHAB POTENTIAL: Good  CLINICAL DECISION MAKING: Stable/uncomplicated  EVALUATION COMPLEXITY: Low   GOALS: Goals reviewed with patient? Yes  SHORT TERM GOALS: Target date: 08/09/2023  Pt will be independent with HEP.  Baseline: Goal status: INITIAL  2.  Pt will be independent with diaphragmatic breathing and down training activities in order to improve pelvic floor relaxation.  Baseline:  Goal status: INITIAL  3.  Pt will be independent with the  knack, urge suppression technique, and double voiding in order to improve bladder habits and decrease urinary incontinence.   Baseline:  Goal status: INITIAL  LONG TERM GOALS: Target date: 01/12/2024  Pt will be independent with advanced HEP.  Baseline:  Goal status: INITIAL  2.  Pt to demonstrate improved coordination of pelvic floor and breathing mechanics with 10# squat with appropriate synergistic patterns to decrease pain and leakage at least 75% of the time.    Baseline:  Goal status: INITIAL  3.  Pt to demonstrate at least 4+/5 pelvic floor strength for improved pelvic stability and decreased strain at pelvic floor/ decrease leakage.   Baseline:  Goal status: INITIAL  4.  Pt will be able to run/workout for 30 minutes without leakage or discomfort to improve quality of life and stress urinary incontinence. Baseline:  Goal status: INITIAL  PLAN:  PT FREQUENCY: 1x/week  PT DURATION: 8 weeks  PLANNED INTERVENTIONS: 97110-Therapeutic exercises, 97530- Therapeutic activity, 97112- Neuromuscular re-education, 97535- Self Care, 16109- Manual therapy, Taping, Dry Needling, Joint mobilization, Spinal mobilization, Scar mobilization, Cryotherapy, and Moist heat  PLAN FOR NEXT SESSION: continued pelvic floor AROM training to seated/standing, introduce hip strengthening and progressive pelvic floor training with emphasis on diaphragmatic breathing   Marni Sins, PT 09/27/2023, 9:25 AM

## 2023-10-04 ENCOUNTER — Ambulatory Visit: Admitting: Physical Therapy

## 2023-10-04 ENCOUNTER — Ambulatory Visit: Payer: BC Managed Care – PPO | Admitting: Neurology

## 2023-10-04 DIAGNOSIS — R279 Unspecified lack of coordination: Secondary | ICD-10-CM | POA: Diagnosis not present

## 2023-10-04 DIAGNOSIS — M6281 Muscle weakness (generalized): Secondary | ICD-10-CM

## 2023-10-04 NOTE — Therapy (Signed)
 OUTPATIENT PHYSICAL THERAPY FEMALE PELVIC TREATMENT   Patient Name: Chelsea Flores MRN: [id number] DOB:[birth year], 53 y.o., female Today's Date: 10/04/2023  END OF SESSION:  PT End of Session - 10/04/23 0929     Visit Number 8    Number of Visits 8    Date for PT Re-Evaluation 08/09/23    Authorization Type [employer]    PT Start Time 0845    PT Stop Time 0930    PT Time Calculation (min) 45 min    Activity Tolerance Patient tolerated treatment well    Behavior During Therapy WFL for tasks assessed/performed                    Past Medical History:  Diagnosis Date   Anxiety    Asthma    Back pain    Complication of anesthesia    Depression    Dyspnea    asthma flaring   Dysrhythmia    Headache    Insomnia    Palpitations    PONV (postoperative nausea and vomiting)    PVC (premature ventricular contraction)    Snores    Tenosynovitis of thumb    Vertigo    Past Surgical History:  Procedure Laterality Date   CESAREAN SECTION     cist     removed from thumb   TUBAL LIGATION     Patient Active Problem List   Diagnosis Date Noted   Family history of thromboembolic disease 03/06/2535   Tremor 04/05/2023   Chronic migraine without aura without status migrainosus, not intractable 11/30/2021   Left foot pain 01/09/2016   Hip pain 04/13/2012    PCP: Margarete Sharps, MD  REFERRING PROVIDER: Thora Flint, MD   REFERRING DIAG: [phone] (ICD-10-CM) - Incontinence overflow, stress female  THERAPY DIAG:  Muscle weakness (generalized)  Unspecified lack of coordination  Rationale for Evaluation and Treatment: Rehabilitation  ONSET DATE: 2018  SUBJECTIVE:                                                                                                                                                                                           SUBJECTIVE STATEMENT: Patient reports that she is doing well. Over the weekend she picked up a heavy object  (25-35 lbs) and leaked a little bit, but she didn't breathe correctly when she did this. She was able to jump on and off of a 16 in box with no leakage.  From Eval: Patient reports to PT with urinary incontinence "at times" - When training for a half marathon in 2018 she began noticing incontinence issues which has continued to progress. For  example, recently patient had the flu and was coughing a lot during this time, therefore she experienced more leakage than normal. Patient is able to run a few miles before leakage begins while running. Fluid intake: earl grey tea in the mornings, 1-1.5 liters of water per day; when migraines are not present, 2+ liters; flavored club soda in the evenings occasionally   PAIN:  Are you having pain? No NPRS scale: 0/10  PRECAUTIONS: None  RED FLAGS: None   WEIGHT BEARING RESTRICTIONS: No  FALLS:  Has patient fallen in last 6 months? 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   ACTIVITY LEVEL : not running currently due to leakage, wishes to do another half marathon; youtube weightlifting videos   PLOF: Independent  PATIENT GOALS: return back to running without leakage, to do exercises independently   PERTINENT HISTORY:  2 c-section, gallbladder removal  Sexual abuse: No  BOWEL MOVEMENT: Pain with bowel movement: No Type of bowel movement:Type (Bristol Stool Scale) 2, Frequency 1x/day usually, Strain no, and Splinting yes on occasion Fully empty rectum: Yes:   Leakage: No Pads: No Fiber supplement/laxative No  URINATION: Pain with urination: No Fully empty bladder: Yes:   Stream: Strong Urgency: Yes  Frequency: within normal limits Leakage: Coughing and Sneezing Pads: No  INTERCOURSE: when husband is in town, yes   Ability to have vaginal penetration Yes  Pain with intercourse: Initial Penetration, During Penetration, Deep Penetration, and After Intercourse DrynessNo Climax: yes, no pain  Marinoff Scale: 0/3 No  lubricant  PREGNANCY: Vaginal deliveries 0 C-section deliveries 2 (2000, 2003) Currently pregnant No  PROLAPSE: None  OBJECTIVE:  Note: Objective measures were completed at Evaluation unless otherwise noted.  PATIENT SURVEYS:  PFIQ-7: 10  COGNITION: Overall cognitive status: Within functional limits for tasks assessed     SENSATION: Light touch: Appears intact  LUMBAR SPECIAL TESTS:  Single leg stance test: Positive  FUNCTIONAL TESTS:  Squat:no pain, sufficient lumbopelvic mobility    GAIT: Assistive device utilized: None Comments: mild trendelenburg gait pattern with ambulation   POSTURE: rounded shoulders and forward head  LUMBARAROM/PROM: within normal limits   A/PROM A/PROM  eval  Flexion   Extension   Right lateral flexion   Left lateral flexion   Right rotation   Left rotation    (Blank rows = not tested)  LOWER EXTREMITY ROM: within normal limits   Active ROM Right eval Left eval  Hip flexion    Hip extension    Hip abduction    Hip adduction    Hip internal rotation    Hip external rotation    Knee flexion    Knee extension    Ankle dorsiflexion    Ankle plantarflexion    Ankle inversion    Ankle eversion     (Blank rows = not tested)  LOWER EXTREMITY MMT: 4+/5 bilateral hips and knees grossly   MMT Right eval Left eval  Hip flexion    Hip extension    Hip abduction    Hip adduction    Hip internal rotation    Hip external rotation    Knee flexion    Knee extension    Ankle dorsiflexion    Ankle plantarflexion    Ankle inversion    Ankle eversion     (Blank rows = not tested) PALPATION:   General: no significant tension in bilateral adductors/hip flexors   Pelvic Alignment: within normal limits   Abdominal: abdominal bracing at rest, upper  chest breathing, decreased rib excursion with inhalation                 External Perineal Exam: minimal dryness noted, no pain with palpation of superficial musculature, sufficient  clitoral hood mobility                              Internal Pelvic Floor: no significant muscle tension noted; no pain with examination of pelvic floor musculature layers 1/2/3; general lack of coordination between pelvic floor and diaphragm   Patient confirms identification and approves PT to assess internal pelvic floor and treatment Yes No emotional/communication barriers or cognitive limitation. Patient is motivated to learn. Patient understands and agrees with treatment goals and plan. PT explains patient will be examined in standing, sitting, and lying down to see how their muscles and joints work. When they are ready, they will be asked to remove their underwear so PT can examine their perineum. The patient is also given the option of providing their own chaperone as one is not provided in our facility. The patient also has the right and is explained the right to defer or refuse any part of the evaluation or treatment including the internal exam. With the patient's consent, PT will use one gloved finger to gently assess the muscles of the pelvic floor, seeing how well it contracts and relaxes and if there is muscle symmetry. After, the patient will get dressed and PT and patient will discuss exam findings and plan of care. PT and patient discuss plan of care, schedule, attendance policy and HEP activities.  PELVIC MMT:   MMT eval  Vaginal 3/5, 3 sec hold, 10 quick flicks   Internal Anal Sphincter   External Anal Sphincter   Puborectalis   Diastasis Recti   (Blank rows = not tested)  TONE: Within normal limits   PROLAPSE: N/A   TODAY'S TREATMENT:                                                                                                                              DATE:   09/20/23: Neuro re-ed: 3 lateral steps into a squat jump 2x4 each direction  Single leg deadlift + diaphragmatic breathing (5# kettlebell) 2x10 each  Sumo squat holding 30# kettlebell + diaphragmatic  breathing 2x10  Step ups holding 5# dumbbells + opp knee drive + diaphragmatic breathing 2x8 each  Upside down bosu squats + diaphragmatic breathing 2x10  Upside down bosu mountain climbers + dv 2x10 alternating  Therapeutic exercise: Incline walking 2% 6 mins speed 3.7 - PT present to discuss current status  Reverse lunges holding 10# KB + diaphragmatic breathing 2x10 each  Lateral lunge onto bosu + diaphragmatic breathing 2x10 each  Overhead press + alternating march + diaphragmatic breathing (3# dumbbells) 2x10  Supine butterfly stretch + diaphragmatic breathing 2x5min  Childs pose + diaphragmatic breathing 2x80min  Pretzel piriformis stretch +  diaphragmatic breathing 2x70min each side  Standing hamstring stretch + diaphragmatic breathing 2x39min  Self care: Relative anatomy, connection between the pelvic floor and the diaphragm, pelvic floor active range of motion, vaginal moisturizers Knack technique for stress incontinence, urge drill for urge urinary incontinence  09/27/23: Neuro re-ed: Single leg deadlift + diaphragmatic breathing (15# kettlebell) 2x10 each  Upside down bosu squats + diaphragmatic breathing 3x10 +  5# kettlebell Upside down bosu mountain climbers + dv 2x10 alternating  Therapeutic exercise: Incline walking 2% 6 mins speed 3.8 - PT present to discuss current status  Lateral lunge onto bosu + diaphragmatic breathing 2x10 each  Overhead press + alternating march + diaphragmatic breathing (3# dumbbells) 2x10  Supine butterfly stretch + diaphragmatic breathing 2x28min  Childs pose + diaphragmatic breathing 2x17min  Pretzel piriformis stretch + diaphragmatic breathing 2x31min each side  Standing hamstring stretch + diaphragmatic breathing 2x76min  Self care: Relative anatomy, connection between the pelvic floor and the diaphragm, pelvic floor active range of motion, vaginal moisturizers Knack technique for stress incontinence, urge drill for urge urinary incontinence     10/04/23: Neuro re-ed: Single leg deadlift + diaphragmatic breathing (15# kettlebell) 2x10 each  Upside down bosu squats + diaphragmatic breathing 2x10 +  10# kettlebell Upside down bosu mountain climbers + diaphragmatic breathing 2x20 alternating  Thruster holding dumbbells (10# dumbbells) + diaphragmatic breathing 2x10  Lateral box stepovers (16') + diaphragmatic breathing 2x20  Therapeutic exercise: Lateral lunge onto bosu + diaphragmatic breathing 2x10 each  Overhead press + alternating march + diaphragmatic breathing (3# dumbbells) 2x10  Supine butterfly stretch + diaphragmatic breathing 2x3min  Childs pose + diaphragmatic breathing 2x71min  Pretzel piriformis stretch + diaphragmatic breathing 2x30min each side  Standing hamstring stretch + diaphragmatic breathing 2x29min  Self care: Urge drill for running marathons and distance running Relative anatomy, connection between the pelvic floor and the diaphragm, pelvic floor active range of motion, vaginal moisturizers Knack technique for stress incontinence, urge drill for urge urinary incontinence   Manual therapy Trigger Point Dry Needling Initial Treatment: Pt instructed on Dry Needling rational, procedures, and possible side effects. Pt instructed to expect mild to moderate muscle soreness later in the day and/or into the next day.  Pt instructed in methods to reduce muscle soreness. Pt instructed to continue prescribed HEP. Because Dry Needling was performed over or adjacent to a lung field, pt was educated on S/S of pneumothorax and to seek immediate medical attention should they occur.  Patient was educated on signs and symptoms of infection and other risk factors and advised to seek medical attention should they occur.  Patient verbalized understanding of these instructions and education.  Patient Verbal Consent Given: Yes Education Handout Provided: Yes Muscles Treated: bilateral lumbar multifidi L3/L4/L5 Electrical  Stimulation Performed: No Treatment Response/Outcome: decreased trigger point palpation bilaterally   PATIENT EDUCATION:  Education details: Relative anatomy, connection between the pelvic floor and the diaphragm, pelvic floor active range of motion, vaginal moisturizers  Person educated: Patient Education method: Explanation, Demonstration, Tactile cues, Verbal cues, and Handouts Education comprehension: verbalized understanding, returned demonstration, verbal cues required, and tactile cues required  HOME EXERCISE PROGRAM: Access Code: [email or web address] URL: [email or web address] Date: 10/04/2023 Prepared by: Robbin Chill  Exercises - Single Leg Deadlift with Kettlebell  - 1 x daily - 7 x weekly - 2 sets - 12 reps - Runner's Climb  - 1 x daily - 7 x weekly - 2 sets - 8 reps - Squat on Flat Side of BOSU  -  1 x daily - 7 x weekly - 2 sets - 10 reps - Plank Curls on BOSU Ball  - 1 x daily - 7 x weekly - 2 sets - 20 reps - Lateral Lunge  - 1 x daily - 7 x weekly - 2 sets - 12 reps - Box Jump  - 1 x daily - 7 x weekly - 2 sets - 10 reps - Sumo Squat with Dumbbell  - 1 x daily - 7 x weekly - 2 sets - 12 reps - Supine Butterfly Groin Stretch  - 1 x daily - 7 x weekly - 2 sets -  hold - Supine Figure 4 Piriformis Stretch  - 1 x daily - 7 x weekly - 2 sets - hold - Child's Pose Stretch  - 1 x daily - 7 x weekly - 2 sets - hold - Barbell Thruster  - 1 x daily - 7 x weekly - 2 sets - 10 reps - Lateral Step Up and Overs on BOSU  - 1 x daily - 7 x weekly - 2 sets - 20 reps  ASSESSMENT:  CLINICAL IMPRESSION: Patient is a 53 y.o. female  who was seen today for physical therapy treatment for stress incontinence. No leakage to report with running or exercises this week. Pelvic floor training progressed in intensity and load today with no issues from pt. Manual interventions performed on bil lumbar musculature to decrease palpable trigger point discomfort, decreased pain  following. No pain or urinary leakage during or after today's treatment session. Pt would benefit from additional PT to further address deficits.    OBJECTIVE IMPAIRMENTS: decreased coordination, decreased endurance, decreased mobility, decreased ROM, and decreased strength.   ACTIVITY LIMITATIONS: continence  PARTICIPATION LIMITATIONS: running for long distances  PERSONAL FACTORS: Past/current experiences and Time since onset of injury/illness/exacerbation are also affecting patient's functional outcome.   REHAB POTENTIAL: Good  CLINICAL DECISION MAKING: Stable/uncomplicated  EVALUATION COMPLEXITY: Low   GOALS: Goals reviewed with patient? Yes  SHORT TERM GOALS: Target date: 08/09/2023  Pt will be independent with HEP.  Baseline: Goal status: INITIAL  2.  Pt will be independent with diaphragmatic breathing and down training activities in order to improve pelvic floor relaxation.  Baseline:  Goal status: INITIAL  3.  Pt will be independent with the knack, urge suppression technique, and double voiding in order to improve bladder habits and decrease urinary incontinence.   Baseline:  Goal status: INITIAL  LONG TERM GOALS: Target date: 01/12/2024  Pt will be independent with advanced HEP.  Baseline:  Goal status: INITIAL  2.  Pt to demonstrate improved coordination of pelvic floor and breathing mechanics with 10# squat with appropriate synergistic patterns to decrease pain and leakage at least 75% of the time.    Baseline:  Goal status: INITIAL  3.  Pt to demonstrate at least 4+/5 pelvic floor strength for improved pelvic stability and decreased strain at pelvic floor/ decrease leakage.   Baseline:  Goal status: INITIAL  4.  Pt will be able to run/workout for 30 minutes without leakage or discomfort to improve quality of life and stress urinary incontinence. Baseline:  Goal status: INITIAL  PLAN:  PT FREQUENCY: 1x/week  PT DURATION: 8 weeks  PLANNED INTERVENTIONS:  97110-Therapeutic exercises, 97530- Therapeutic activity, 97112- Neuromuscular re-education, 97535- Self Care, 40102- Manual therapy, Taping, Dry Needling, Joint mobilization, Spinal mobilization, Scar mobilization, Cryotherapy, and Moist heat  PLAN FOR NEXT SESSION: continued pelvic floor AROM training to seated/standing, introduce  hip strengthening and progressive pelvic floor training with emphasis on diaphragmatic breathing   Marni Sins, PT 10/04/2023, 9:30 AM

## 2023-10-04 NOTE — Patient Instructions (Signed)
 When on a run and you feel a sudden sense of urgency, do the following:  Urge Drill When you feel an urge to go, follow these steps to regain control: Stop what you are doing - you can slow down to a walk and try this  Take one deep breath, directing your air into your abdomen Think an affirming thought, such as "I've got this." Do 5 quick flicks of your pelvic floor If the urge goes away or decreases, try to resume running. Stop again and repeat this drill if the urge returns. Complete this as many times as needed.

## 2023-10-11 ENCOUNTER — Encounter: Admitting: Physical Therapy

## 2023-10-12 ENCOUNTER — Ambulatory Visit: Admitting: Neurology

## 2023-10-18 ENCOUNTER — Ambulatory Visit: Attending: Obstetrics and Gynecology | Admitting: Physical Therapy

## 2023-10-18 DIAGNOSIS — D225 Melanocytic nevi of trunk: Secondary | ICD-10-CM | POA: Diagnosis not present

## 2023-10-18 DIAGNOSIS — M6281 Muscle weakness (generalized): Secondary | ICD-10-CM | POA: Diagnosis not present

## 2023-10-18 DIAGNOSIS — R279 Unspecified lack of coordination: Secondary | ICD-10-CM | POA: Insufficient documentation

## 2023-10-18 DIAGNOSIS — D2272 Melanocytic nevi of left lower limb, including hip: Secondary | ICD-10-CM | POA: Diagnosis not present

## 2023-10-18 DIAGNOSIS — L820 Inflamed seborrheic keratosis: Secondary | ICD-10-CM | POA: Diagnosis not present

## 2023-10-18 DIAGNOSIS — D2271 Melanocytic nevi of right lower limb, including hip: Secondary | ICD-10-CM | POA: Diagnosis not present

## 2023-10-18 DIAGNOSIS — L814 Other melanin hyperpigmentation: Secondary | ICD-10-CM | POA: Diagnosis not present

## 2023-10-18 DIAGNOSIS — L570 Actinic keratosis: Secondary | ICD-10-CM | POA: Diagnosis not present

## 2023-10-18 NOTE — Therapy (Signed)
 OUTPATIENT PHYSICAL THERAPY FEMALE PELVIC DISCHARGE SUMMARY   Patient Name: Chelsea Flores MRN: [id number] DOB:[birth year], 53 y.o., female Today's Date: 10/18/2023  END OF SESSION:           Past Medical History:  Diagnosis Date   Anxiety    Asthma    Back pain    Complication of anesthesia    Depression    Dyspnea    asthma flaring   Dysrhythmia    Headache    Insomnia    Palpitations    PONV (postoperative nausea and vomiting)    PVC (premature ventricular contraction)    Snores    Tenosynovitis of thumb    Vertigo    Past Surgical History:  Procedure Laterality Date   CESAREAN SECTION     cist     removed from thumb   TUBAL LIGATION     Patient Active Problem List   Diagnosis Date Noted   Family history of thromboembolic disease 40/98/1191   Tremor 04/05/2023   Chronic migraine without aura without status migrainosus, not intractable 11/30/2021   Left foot pain 01/09/2016   Hip pain 04/13/2012    PCP: Margarete Sharps, MD  REFERRING PROVIDER: Thora Flint, MD   REFERRING DIAG: [phone] (ICD-10-CM) - Incontinence overflow, stress female  THERAPY DIAG:  Muscle weakness (generalized)  Unspecified lack of coordination  Rationale for Evaluation and Treatment: Rehabilitation  ONSET DATE: 2018  SUBJECTIVE:                                                                                                                                                                                           SUBJECTIVE STATEMENT: Patient reports that she is doing well. She has been doing yard work and pulling weeds/picking up sticks with no leakage. She went on a hike in [place]  (3 miles) and had no leakage on the hike. No bowel issues to report. No pain with intercourse. No urinary concerns. Patient is amenable to discharge today.  From Eval: Patient reports to PT with urinary incontinence "at times" - When training for a half marathon in 2018 she began  noticing incontinence issues which has continued to progress. For example, recently patient had the flu and was coughing a lot during this time, therefore she experienced more leakage than normal. Patient is able to run a few miles before leakage begins while running. Fluid intake: earl grey tea in the mornings, 1-1.5 liters of water per day; when migraines are not present, 2+ liters; flavored club soda in the evenings occasionally   PAIN:  Are you having pain? No NPRS scale: 0/10  PRECAUTIONS: None  RED  FLAGS: None   WEIGHT BEARING RESTRICTIONS: No  FALLS:  Has patient fallen in last 6 months? 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   ACTIVITY LEVEL : not running currently due to leakage, wishes to do another half marathon; youtube weightlifting videos   PLOF: Independent  PATIENT GOALS: return back to running without leakage, to do exercises independently   PERTINENT HISTORY:  2 c-section, gallbladder removal  Sexual abuse: No  BOWEL MOVEMENT: Pain with bowel movement: No Type of bowel movement:Type (Bristol Stool Scale) 2, Frequency 1x/day usually, Strain no, and Splinting yes on occasion Fully empty rectum: Yes:   Leakage: No Pads: No Fiber supplement/laxative No  URINATION: Pain with urination: No Fully empty bladder: Yes:   Stream: Strong Urgency: Yes  Frequency: within normal limits Leakage: Coughing and Sneezing Pads: No  INTERCOURSE: when husband is in town, yes   Ability to have vaginal penetration Yes  Pain with intercourse: Initial Penetration, During Penetration, Deep Penetration, and After Intercourse DrynessNo Climax: yes, no pain  Marinoff Scale: 0/3 No lubricant  PREGNANCY: Vaginal deliveries 0 C-section deliveries 2 (2000, 2003) Currently pregnant No  PROLAPSE: None  OBJECTIVE:  Note: Objective measures were completed at Evaluation unless otherwise noted.  PATIENT SURVEYS:  PFIQ-7: 10  COGNITION: Overall cognitive  status: Within functional limits for tasks assessed     SENSATION: Light touch: Appears intact  LUMBAR SPECIAL TESTS:  Single leg stance test: Positive  FUNCTIONAL TESTS:  Squat:no pain, sufficient lumbopelvic mobility    GAIT: Assistive device utilized: None Comments: mild trendelenburg gait pattern with ambulation   POSTURE: rounded shoulders and forward head  LUMBARAROM/PROM: within normal limits   A/PROM A/PROM  eval  Flexion   Extension   Right lateral flexion   Left lateral flexion   Right rotation   Left rotation    (Blank rows = not tested)  LOWER EXTREMITY ROM: within normal limits   Active ROM Right eval Left eval  Hip flexion    Hip extension    Hip abduction    Hip adduction    Hip internal rotation    Hip external rotation    Knee flexion    Knee extension    Ankle dorsiflexion    Ankle plantarflexion    Ankle inversion    Ankle eversion     (Blank rows = not tested)  LOWER EXTREMITY MMT: 4+/5 bilateral hips and knees grossly   MMT Right eval Left eval  Hip flexion    Hip extension    Hip abduction    Hip adduction    Hip internal rotation    Hip external rotation    Knee flexion    Knee extension    Ankle dorsiflexion    Ankle plantarflexion    Ankle inversion    Ankle eversion     (Blank rows = not tested) PALPATION:   General: no significant tension in bilateral adductors/hip flexors   Pelvic Alignment: within normal limits   Abdominal: abdominal bracing at rest, upper chest breathing, decreased rib excursion with inhalation                 External Perineal Exam: minimal dryness noted, no pain with palpation of superficial musculature, sufficient clitoral hood mobility                              Internal Pelvic Floor: no significant muscle tension noted; no pain  with examination of pelvic floor musculature layers 1/2/3; general lack of coordination between pelvic floor and diaphragm   Patient confirms identification and  approves PT to assess internal pelvic floor and treatment Yes No emotional/communication barriers or cognitive limitation. Patient is motivated to learn. Patient understands and agrees with treatment goals and plan. PT explains patient will be examined in standing, sitting, and lying down to see how their muscles and joints work. When they are ready, they will be asked to remove their underwear so PT can examine their perineum. The patient is also given the option of providing their own chaperone as one is not provided in our facility. The patient also has the right and is explained the right to defer or refuse any part of the evaluation or treatment including the internal exam. With the patient's consent, PT will use one gloved finger to gently assess the muscles of the pelvic floor, seeing how well it contracts and relaxes and if there is muscle symmetry. After, the patient will get dressed and PT and patient will discuss exam findings and plan of care. PT and patient discuss plan of care, schedule, attendance policy and HEP activities.  PELVIC MMT:   MMT eval  Vaginal 3/5, 3 sec hold, 10 quick flicks   Internal Anal Sphincter   External Anal Sphincter   Puborectalis   Diastasis Recti   (Blank rows = not tested)  TONE: Within normal limits   PROLAPSE: N/A   TODAY'S TREATMENT:                                                                                                                              DATE:   09/27/23: Neuro re-ed: Single leg deadlift + diaphragmatic breathing (15# kettlebell) 2x10 each  Upside down bosu squats + diaphragmatic breathing 3x10 +  5# kettlebell Upside down bosu mountain climbers + dv 2x10 alternating  Therapeutic exercise: Incline walking 2% 6 mins speed 3.8 - PT present to discuss current status  Lateral lunge onto bosu + diaphragmatic breathing 2x10 each  Overhead press + alternating march + diaphragmatic breathing (3# dumbbells) 2x10  Supine butterfly  stretch + diaphragmatic breathing 2x29min  Childs pose + diaphragmatic breathing 2x7min  Pretzel piriformis stretch + diaphragmatic breathing 2x64min each side  Standing hamstring stretch + diaphragmatic breathing 2x34min  Self care: Relative anatomy, connection between the pelvic floor and the diaphragm, pelvic floor active range of motion, vaginal moisturizers Knack technique for stress incontinence, urge drill for urge urinary incontinence    10/04/23: Neuro re-ed: Single leg deadlift + diaphragmatic breathing (15# kettlebell) 2x10 each  Upside down bosu squats + diaphragmatic breathing 2x10 +  10# kettlebell Upside down bosu mountain climbers + diaphragmatic breathing 2x20 alternating  Thruster holding dumbbells (10# dumbbells) + diaphragmatic breathing 2x10  Lateral box stepovers (16') + diaphragmatic breathing 2x20  Therapeutic exercise: Lateral lunge onto bosu + diaphragmatic breathing 2x10 each  Overhead press + alternating march + diaphragmatic  breathing (3# dumbbells) 2x10  Supine butterfly stretch + diaphragmatic breathing 2x53min  Childs pose + diaphragmatic breathing 2x39min  Pretzel piriformis stretch + diaphragmatic breathing 2x33min each side  Standing hamstring stretch + diaphragmatic breathing 2x56min  Self care: Urge drill for running marathons and distance running Relative anatomy, connection between the pelvic floor and the diaphragm, pelvic floor active range of motion, vaginal moisturizers Knack technique for stress incontinence, urge drill for urge urinary incontinence   Manual therapy Trigger Point Dry Needling Initial Treatment: Pt instructed on Dry Needling rational, procedures, and possible side effects. Pt instructed to expect mild to moderate muscle soreness later in the day and/or into the next day.  Pt instructed in methods to reduce muscle soreness. Pt instructed to continue prescribed HEP. Because Dry Needling was performed over or adjacent to a lung field,  pt was educated on S/S of pneumothorax and to seek immediate medical attention should they occur.  Patient was educated on signs and symptoms of infection and other risk factors and advised to seek medical attention should they occur.  Patient verbalized understanding of these instructions and education.  Patient Verbal Consent Given: Yes Education Handout Provided: Yes Muscles Treated: bilateral lumbar multifidi L3/L4/L5 Electrical Stimulation Performed: No Treatment Response/Outcome: decreased trigger point palpation bilaterally   10/18/23: Neuro re-ed: Single leg deadlift + diaphragmatic breathing (15# kettlebell) 2x10 each  Thruster holding dumbbells (10# dumbbells) + diaphragmatic breathing 2x10  Box jump ups + diaphragmatic breathing 2x5  Therapeutic exercise: Seated ball roll outs+ diaphragmatic breathing 2x10  Self care: Urge drill for running marathons and distance running Relative anatomy, connection between the pelvic floor and the diaphragm, pelvic floor active range of motion, vaginal moisturizers Knack technique for stress incontinence, urge drill for urge urinary incontinence    PATIENT EDUCATION:  Education details: Relative anatomy, connection between the pelvic floor and the diaphragm, pelvic floor active range of motion, vaginal moisturizers  Person educated: Patient Education method: Explanation, Demonstration, Tactile cues, Verbal cues, and Handouts Education comprehension: verbalized understanding, returned demonstration, verbal cues required, and tactile cues required  HOME EXERCISE PROGRAM: Access Code: [email or web address] URL: [email or web address] Date: 10/04/2023 Prepared by: Robbin Chill  Exercises - Single Leg Deadlift with Kettlebell  - 1 x daily - 7 x weekly - 2 sets - 12 reps - Runner's Climb  - 1 x daily - 7 x weekly - 2 sets - 8 reps - Squat on Flat Side of BOSU  - 1 x daily - 7 x weekly - 2 sets - 10 reps - Plank Curls on BOSU Ball  - 1 x  daily - 7 x weekly - 2 sets - 20 reps - Lateral Lunge  - 1 x daily - 7 x weekly - 2 sets - 12 reps - Box Jump  - 1 x daily - 7 x weekly - 2 sets - 10 reps - Sumo Squat with Dumbbell  - 1 x daily - 7 x weekly - 2 sets - 12 reps - Supine Butterfly Groin Stretch  - 1 x daily - 7 x weekly - 2 sets -  hold - Supine Figure 4 Piriformis Stretch  - 1 x daily - 7 x weekly - 2 sets - hold - Child's Pose Stretch  - 1 x daily - 7 x weekly - 2 sets - hold - Barbell Thruster  - 1 x daily - 7 x weekly - 2 sets - 10 reps - Lateral Step Up and Overs on BOSU  -  1 x daily - 7 x weekly - 2 sets - 20 reps  ASSESSMENT:  CLINICAL IMPRESSION: Patient is a 53 y.o. female  who was seen today for physical therapy discharge for stress incontinence. She has attended 9 sessions of pelvic PT and is experiencing no urinary leakage at all. She is pleased with her progress and is amenable to discharge at this time. Today we reviewed how to continue pelvic PT after discharge and how to progress her training appropriately. No pain or urinary leakage during or after today's treatment session, and she is appropriate for discharge at this time.  OBJECTIVE IMPAIRMENTS: decreased coordination, decreased endurance, decreased mobility, decreased ROM, and decreased strength.   ACTIVITY LIMITATIONS: continence  PARTICIPATION LIMITATIONS: running for long distances  PERSONAL FACTORS: Past/current experiences and Time since onset of injury/illness/exacerbation are also affecting patient's functional outcome.   REHAB POTENTIAL: Good  CLINICAL DECISION MAKING: Stable/uncomplicated  EVALUATION COMPLEXITY: Low   GOALS: Goals reviewed with patient? Yes  SHORT TERM GOALS: Target date: 08/09/2023  Pt will be independent with HEP.  Baseline: Goal status: GOAL MET 10/04/23  2.  Pt will be independent with diaphragmatic breathing and down training activities in order to improve pelvic floor relaxation.  Baseline:  Goal  status: GOAL MET 10/04/23  3.  Pt will be independent with the knack, urge suppression technique, and double voiding in order to improve bladder habits and decrease urinary incontinence.   Baseline:  Goal status: GOAL MET 10/04/23  LONG TERM GOALS: Target date: 01/12/2024  Pt will be independent with advanced HEP.  Baseline:  Goal status: GOAL MET 10/04/23  2.  Pt to demonstrate improved coordination of pelvic floor and breathing mechanics with 10# squat with appropriate synergistic patterns to decrease pain and leakage at least 75% of the time.    Baseline:  Goal status: GOAL MET 10/04/23  3.  Pt to demonstrate at least 4+/5 pelvic floor strength for improved pelvic stability and decreased strain at pelvic floor/ decrease leakage.   Baseline:  Goal status: GOAL MET 10/18/23  4.  Pt will be able to run/workout for 30 minutes without leakage or discomfort to improve quality of life and stress urinary incontinence. Baseline:  Goal status: GOAL MET 10/18/23  PHYSICAL THERAPY DISCHARGE SUMMARY  Visits from Start of Care: 9  Current functional level related to goals / functional outcomes: See above   Remaining deficits: See above   Education / Equipment: See above   Patient agrees to discharge. Patient goals were met. Patient is being discharged due to being pleased with the current functional level.   Marni Sins, PT 10/18/2023, 8:45 AM

## 2023-10-18 NOTE — Patient Instructions (Signed)
 What to do after finishing pelvic PT: If you are currently exercising 3-4x/wk, make sure that 1-2 of those days involve some form of pelvic maintenance Meaning, implement 1-2 pelvic floor exercises in your warm up OR cool down for your workout that day  OR - if you want your pelvic training to be separate from your exercising routine, you can perform 5-10 belly breaths with pelvic floor contractions each night before bed  When you return to running, start with individual miles. I want you to be able to run 3x/wk without any urinary symptoms before you increase your mileage.  If you increase your mileage and find that you start to leak again, stop your running and walk while you do some pelvic floor contractions and see if you can resume running

## 2023-11-07 NOTE — Telephone Encounter (Signed)
 [facility] @ [phone] to initiate shara renewal, spoke with Rosaria. She was able to initial auth over the phone, pending clinical review. Turnaround time is 72 hours, pending case # [id number].

## 2023-11-16 NOTE — Telephone Encounter (Signed)
 Called [employer] to check status, Chelsea Flores was approved.  Auth#: [id number] (11/07/23-11/06/24) Reference #: [id number]

## 2023-12-06 ENCOUNTER — Ambulatory Visit (INDEPENDENT_AMBULATORY_CARE_PROVIDER_SITE_OTHER): Admitting: Neurology

## 2023-12-06 VITALS — BP 94/69 | HR 74

## 2023-12-06 DIAGNOSIS — G43709 Chronic migraine without aura, not intractable, without status migrainosus: Secondary | ICD-10-CM

## 2023-12-06 MED ORDER — ABOBOTULINUMTOXINA 500 UNITS IM SOLR
500.0000 [IU] | Freq: Once | INTRAMUSCULAR | Status: AC
  Administered 2023-12-06: 166 [IU] via INTRAMUSCULAR

## 2023-12-06 NOTE — Progress Notes (Signed)
 Consent Form Botulism Toxin Injection For Chronic Migraine  12/06/2023: > 50% improvement in migraine and headache frequency and severity. 08/30/2023: >50% improvement in migraine freq and severity. She is a International aid/development worker. Place botox  high in the forehead to avoid forehead droop and vision problems (has a low brow)  05/23/2023: First botulinum toxin. We diluted the 500units of dysport  with 3.5 ml so that every 0.62ml is approx 15units dysport  which is approx 5units botox  in order to replicate the migraine protocol with Botox  as best we could. Insurance would not approve Botox  but it did approve dysport  for migraine which is unusual. Will inject frontalis very high.  Reviewed orally with patient, additionally signature is on file:  Botulism toxin has been approved by the Federal drug administration for treatment of chronic migraine. Botulism toxin does not cure chronic migraine and it may not be effective in some patients.  The administration of botulism toxin is accomplished by injecting a small amount of toxin into the muscles of the neck and head. Dosage must be titrated for each individual. Any benefits resulting from botulism toxin tend to wear off after 3 months with a repeat injection required if benefit is to be maintained. Injections are usually done every 3-4 months with maximum effect peak achieved by about 2 or 3 weeks. Botulism toxin is expensive and you should be sure of what costs you will incur resulting from the injection.  The side effects of botulism toxin use for chronic migraine may include:   -Transient, and usually mild, facial weakness with facial injections  -Transient, and usually mild, head or neck weakness with head/neck injections  -Reduction or loss of forehead facial animation due to forehead muscle weakness  -Eyelid drooping  -Dry eye  -Pain at the site of injection or bruising at the site of injection  -Double vision  -Potential unknown long term  risks  Contraindications: You should not have Botox  if you are pregnant, nursing, allergic to albumin, have an infection, skin condition, or muscle weakness at the site of the injection, or have myasthenia gravis, Lambert-Eaton syndrome, or ALS.  It is also possible that as with any injection, there may be an allergic reaction or no effect from the medication. Reduced effectiveness after repeated injections is sometimes seen and rarely infection at the injection site may occur. All care will be taken to prevent these side effects. If therapy is given over a long time, atrophy and wasting in the muscle injected may occur. Occasionally the patient's become refractory to treatment because they develop antibodies to the toxin. In this event, therapy needs to be modified.  I have read the above information and consent to the administration of botulism toxin.    BOTOX  PROCEDURE NOTE FOR MIGRAINE HEADACHE    Contraindications and precautions discussed with patient(above). Aseptic procedure was observed and patient tolerated procedure. Procedure performed by Dr. Andree Epp  The condition has existed for more than 6 months, and pt does not have a diagnosis of ALS, Myasthenia Gravis or Lambert-Eaton Syndrome.  Risks and benefits of injections discussed and pt agrees to proceed with the procedure.  Written consent obtained  These injections are medically necessary. Pt  receives good benefits from these injections. These injections do not cause sedations or hallucinations which the oral therapies may cause.  Description of procedure:  The patient was placed in a sitting position. The standard protocol was used for Botox  as follows, with 5 units of Botox  injected at each site:   -Procerus muscle, midline  injection  -Corrugator muscle, bilateral injection  -Frontalis muscle, bilateral injection, with 2 sites each side, medial injection was performed in the upper one third of the frontalis muscle, in  the region vertical from the medial inferior edge of the superior orbital rim. The lateral injection was again in the upper one third of the forehead vertically above the lateral limbus of the cornea, 1.5 cm lateral to the medial injection site.  -Temporalis muscle injection, 4 sites, bilaterally. The first injection was 3 cm above the tragus of the ear, second injection site was 1.5 cm to 3 cm up from the first injection site in line with the tragus of the ear. The third injection site was 1.5-3 cm forward between the first 2 injection sites. The fourth injection site was 1.5 cm posterior to the second injection site.   -Occipitalis muscle injection, 3 sites, bilaterally. The first injection was done one half way between the occipital protuberance and the tip of the mastoid process behind the ear. The second injection site was done lateral and superior to the first, 1 fingerbreadth from the first injection. The third injection site was 1 fingerbreadth superiorly and medially from the first injection site.  -Cervical paraspinal muscle injection, 2 sites, bilateral knee first injection site was 1 cm from the midline of the cervical spine, 3 cm inferior to the lower border of the occipital protuberance. The second injection site was 1.5 cm superiorly and laterally to the first injection site.  -Trapezius muscle injection was performed at 3 sites, bilaterally. The first injection site was in the upper trapezius muscle halfway between the inflection point of the neck, and the acromion. The second injection site was one half way between the acromion and the first injection site. The third injection was done between the first injection site and the inflection point of the neck.   Will return for repeat injection in 3 months.   500 units of Dysport  was used. The patient tolerated the procedure well, there were no complications of the above procedure.

## 2023-12-06 NOTE — Progress Notes (Signed)
 Dysport  500 units x 1 vial Lot: [id number] Expiration: 03/09/2025 NDC: [address]-8  Bacteriostatic 0.9% Sodium Chloride - 3 mL  Lot: [id number] Expiration: 03/09/2025 NDC: 590-8033-97  Dx: H56.290 B/B Witnessed by Rojean DEL CMA

## 2024-01-02 ENCOUNTER — Encounter: Payer: Self-pay | Admitting: Neurology

## 2024-01-02 ENCOUNTER — Ambulatory Visit (INDEPENDENT_AMBULATORY_CARE_PROVIDER_SITE_OTHER): Admitting: Neurology

## 2024-01-02 VITALS — BP 101/66 | HR 70 | Ht 68.0 in | Wt 158.0 lb

## 2024-01-02 DIAGNOSIS — G43709 Chronic migraine without aura, not intractable, without status migrainosus: Secondary | ICD-10-CM

## 2024-01-02 DIAGNOSIS — G43109 Migraine with aura, not intractable, without status migrainosus: Secondary | ICD-10-CM | POA: Diagnosis not present

## 2024-01-02 NOTE — Progress Notes (Signed)
 Patient: Chelsea Flores Date of Birth: 07/28/1970  Reason for Visit: Follow up History from: Patient Primary Neurologist: Ines    01/02/2024: On botox  for migraines. Everything is going well. She is on dysport  and doing well > 60-70% percent improvement in migraine and headache severity and frequency. She feels it is significantly improving life. When she does have a migraine she takes rizatriptan  and ubrelvy . She feels slower the days after her migraine. The postdrome is fogginess for several days. She has 4 migraine days a month and 4 total headache days a month and doing extremely well. She has a migraine aura, still contraindicated. She still has a tremor, stable, not worsening, she is a Careers adviser, ongoing for a decade, no hx of eye findings, discussed wilson;t disease unliely, she is a Administrator, Civil Service,   04/05/2023: She did well on Botox (>80% of migraine and headache frequency and seveirty ) but had to try Emgality  for insurance, inurance said: I submitted an auth to [employer] and received a denial back, stating that pt would need to try and fail three of the following drugs before Botox  would be approved: Aimovig, Ajovy , Emgality , Qulipta, Dysport , Myobloc.  She still has daily headaches and > 15 migraine days a month will send in for botulinum approval try dysport . Per what insurance said to us  above.   1.  Chronic migraine headaches -Had superb benefit with initial Botox  injection in April 2024, but insurance denied subsequent injection due to requiring trial of further medication -Emgality  for 3 months not of significant benefit still with chronic migraines -Continue Ubrelvy , Maxalt  as needed for acute headache -Currently about daily headache days a month, >15 are migraines that can linger for several days and are moderate to severe and can last up to 24 hours or longer, no aura, no medication overuse  -Previously tried and failed > 3 months: Ajovy , Aimovig, Emgality . Gabapentin , Topiramate(hair  loss, numbness and tingling and cannot perform surgery), tylenol , flexeril , decadron , lexapro, propranolol(asthmatic,contraindicated), amitriptyline/nortriptyline(sedation and weight gain), robaxin , nsaids, OTC analgesics, verapamil and other BP meds like carvedilol (contraindicated due to low blood pressure she runs 100-114 systolic), imitrex, ubrelvy (helps),  depakote. Botox  helped the most she had one injection and >80% of migraine and headache frequency and seveirty  -Next Steps: restart Botulinum for chronic migraines, imitrex(did nothing, Rizatriptan , Nurtec did nothing  Also sounds like essential tremor, no previous history of anti-dopamine drugs, no caffeine daily, discussed parkinson's disease, deep brain stimulation, we have no medications to alter the course except exercise, encouraged exercise, skin checks, and if she likes (dad has PD) we can can evaluate in the office and order DAT scan. She will monitor and we will see her in the office at next appointment.  Acutely uses ubrelvy  and rizatriptan  and brings it down to a 3/10. Add an ibuprofen(take with food for ulcer protection) she had an ulcer when taking ibuprofen 16 times a month and now would only need it a few times a month as long as < 6x a month I believe it would ok and take with food. Or try the toradol .   Will have her in the office in 6 months, sent note for my staff to call, I need to examine this tremor appears to be more essential tremor but we will evaluate. Father had PD  Patient complains of symptoms per HPI as well as the following symptoms: none . Pertinent negatives and positives per HPI. All others negative   No orders of the defined types were placed in  this encounter.  HISTORY OF PRESENT ILLNESS: Today 01/02/24 Had her first Botox  injection 08/10/2022.  Her insurance denied subsequent injections, new knowledge that her insurance did require PA.  Here today to discuss treatment options.  Insurance requiring 3  equivalent formulary drugs (Aimovig, Ajovy , Emgality , Qulipta). With Botox , no migraines the 1st and 2nd month, in the 3rd month she had 4 migraines as Botox  wore off. Migraines can cluster. Migraine process can linger 2-3 days with cognitive symptoms. Using Ubrelvy  as rescue.   Tried > 3 months: Gabapentin , Topiramate(hair loss, numbness and tingling and cannot perform surgery), tylenol , flexeril , decadron , lexapro, propranolol(asthmatic,contraindicated), amitriptyline/nortriptyline(sedation and weight gain), robaxin , nsaids, OTC analgesics, verapamil and other BP meds like carvedilol (contraindicated due to low blood pressure), imitrex, ubrelvy (helps), Aimovig contraindicated due to constipation, depakote.  06/16/22 SS: Here today today to discuss Botox . Remains on Ajovy . Several breakthrough headaches, with nausea, tried Nurtec, didn't help. Another time took Ubrelvy  and Maxalt  was able to stay at work. 1 spell of right trigeminal pain, sent her to the floor in [employer]. On Ajovy  since July 2023. Doing her injection to abdomen, has had redness x 3 months. Just did Ajovy  06/11/22. This past month 12-15 headache days. Her migraines are episodic. Is perimenopausal. Her migraines are episodic. Has not taken Toradol  injection due to gastric ulcer. Planning to travel to [place] for the next 2 weeks.   HISTORY 04/05/2022: Things are getting better. The ajovy  is working well. She will make sure to let me know if she likes maxalt , ubrelvy  or nurtec and we will order for her.  She was having On average 15 mod to severe migraine days a month, now <= 4 a month and no headaches.    Patient complains of symptoms per HPI as well as the following symptoms: improved migraines . Pertinent negatives and positives per HPI. All others negative     HPI:  Chelsea Flores is a 53 y.o. female here as requested by Onita Rush, MD for migraines. Started in college with vomiting, nausea, visual loss right lateral and dot on the  left and has visual auras. She is well aware of stroke risk with migraine with aura. A big trigger is hormones. Caffeine is also a trigger. She is peri-menopausal, dehydration has been a trigger, taking too much advil has caused rebound and ulcers and now she does not take them, no medication overuse, in 2018 she would get 5-7 migraine in a week or two (not cluster headaches). She can get a dot in the center and slowly gets bigger and bigger when she has a visual aura. Her migraines are unilateral, pulsating/pounding/throbbing, movement makes it worse, unilateral, moderate to severe, can last upwards of 24-72 hours or longer untreated. Photophobia,phonophobia, daily headaches, she has a lot of neck tightness, she has pain where the muscles pop up when clenching. On average 15 mod to severe migraine days a month > 5 years. Every first degree relative has migraines. No changes in quality, not positional or exertional, no morning headaches, no vision changes. No other focal neurologic deficits, associated symptoms, inciting events or modifiable factors.   Reviewed notes, labs and imaging from outside physicians, which showed:   Tried: Gabapentin , Topiramate(hair loss, numbness and tingling and cannot perform surgery), tylenol , flexeril , decadron , lexapro, propranolol(asthmatic,contraindicated), amitriptyline/nortriptyline(sedation and weight gain), robaxin , nsaids, OTC analgesics, verapamil and other BP meds like carvedilol (contraindicated due to low blood pressure), imitrex, ubrelvy (helps), Aimovig contraindicated due to constipation, depakote.       REVIEW OF SYSTEMS:  Out of a complete 14 system review of symptoms, the patient complains only of the following symptoms, and all other reviewed systems are negative.  See HPI  ALLERGIES: Allergies  Allergen Reactions   Nsaids Other (See Comments)    Stomach Ulcers    HOME MEDICATIONS: Outpatient Medications Prior to Visit  Medication Sig Dispense  Refill   albuterol (VENTOLIN HFA) 108 (90 Base) MCG/ACT inhaler Inhale 2 puffs into the lungs as needed.     escitalopram (LEXAPRO) 10 MG tablet Take 10 mg by mouth daily. (Patient taking differently: Take 5 mg by mouth daily.)     eszopiclone (LUNESTA) 2 MG TABS tablet Take 2 mg by mouth at bedtime.     gabapentin  (NEURONTIN ) 100 MG capsule Take 100 mg by mouth at bedtime. (Patient taking differently: Take 100 mg by mouth as needed.)     Pediatric Multiple Vitamins (FLINTSTONES MULTIVITAMIN PO) Take 1 tablet by mouth daily. (Patient taking differently: Take 1 tablet by mouth as needed.)     rizatriptan  (MAXALT -MLT) 10 MG disintegrating tablet Take 1 tablet (10 mg total) by mouth as needed for migraine. May repeat in 2 hours if needed 9 tablet 11   UBRELVY  100 MG TABS TAKE 1 TABLET BY MOUTH AS NEEDED AT ONSET OF HEADACHE, MAY REPEAT 1 TABLET OF NEEDED. MAX: 2 TABLETS/DAY (Patient taking differently: as needed.) 16 tablet 11   eszopiclone (LUNESTA) 2 MG TABS tablet Lunesta 2 mg tablet  Take 1 tablet as needed by oral route at bedtime.     No facility-administered medications prior to visit.    PAST MEDICAL HISTORY: Past Medical History:  Diagnosis Date   Anxiety    Asthma    Back pain    Complication of anesthesia    Depression    Dyspnea    asthma flaring   Dysrhythmia    Headache    Insomnia    Palpitations    PONV (postoperative nausea and vomiting)    PVC (premature ventricular contraction)    Snores    Tenosynovitis of thumb    Vertigo     PAST SURGICAL HISTORY: Past Surgical History:  Procedure Laterality Date   CESAREAN SECTION     cist     removed from thumb   TUBAL LIGATION      FAMILY HISTORY: Family History  Problem Relation Age of Onset   Migraines Mother    Migraines Father    Migraines Brother    Migraines Son    Migraines Daughter     SOCIAL HISTORY: Social History   Socioeconomic History   Marital status: Married    Spouse name: brian    Number of children: 2   Years of education: Not on file   Highest education level: Doctorate  Occupational History   Not on file  Tobacco Use   Smoking status: Never   Smokeless tobacco: Never  Vaping Use   Vaping status: Never Used  Substance and Sexual Activity   Alcohol  use: Yes    Alcohol /week: 2.0 standard drinks of alcohol     Types: 2 Glasses of wine per week    Comment: occas.   Drug use: Never   Sexual activity: Yes    Birth control/protection: None, Surgical  Other Topics Concern   Not on file  Social History Narrative   Pt lives alone    Pt works    Social Drivers of Corporate investment banker Strain: Not on file  Food Insecurity: No Food Insecurity (08/18/2023)  Hunger Vital Sign    Worried About Running Out of Food in the Last Year: Never true    Ran Out of Food in the Last Year: Never true  Transportation Needs: No Transportation Needs (08/18/2023)   PRAPARE - Administrator, Civil Service (Medical): No    Lack of Transportation (Non-Medical): No  Physical Activity: Not on file  Stress: Not on file  Social Connections: Not on file  Intimate Partner Violence: Not At Risk (08/18/2023)   Humiliation, Afraid, Rape, and Kick questionnaire    Fear of Current or Ex-Partner: No    Emotionally Abused: No    Physically Abused: No    Sexually Abused: No     Physical exam: Exam: Gen: NAD, conversant      CV: No palpitations or chest pain or SOB. VS: Breathing at a normal rate. Not febrile. Eyes: Conjunctivae clear without exudates or hemorrhage  Neuro: Detailed Neurologic Exam  Speech:    Speech is normal; fluent and spontaneous with normal comprehension.  Cognition:    The patient is oriented to person, place, and time;     recent and remote memory intact;     language fluent;     normal attention, concentration, fund of knowledge Cranial Nerves:    The pupils are equal, round, and reactive to light. Visual fields are full Extraocular  movements are intact.  The face is symmetric with normal sensation. The palate elevates in the midline. Hearing intact. Voice is normal. Shoulder shrug is normal. The tongue has normal motion without fasciculations.   Coordination: normal  Gait:    No abnormalities noted or reported  Motor Observation:   no involuntary movements noted. Tone:    Appears normal  Posture:    Posture is normal. normal erect    Strength:    Strength is anti-gravity and symmetric in the upper and lower limbs.      Sensation: intact to LT, no reports of numbness or tingling or paresthesias        DIAGNOSTIC DATA (LABS, IMAGING, TESTING) - I reviewed patient records, labs, notes, testing and imaging myself where available.  Lab Results  Component Value Date   WBC 3.8 (L) 08/18/2023   HGB 14.7 08/18/2023   HCT 42.0 08/18/2023   MCV 87.0 08/18/2023   PLT 217 08/18/2023      Component Value Date/Time   NA 142 08/18/2023 1106   K 4.1 08/18/2023 1106   CL 107 08/18/2023 1106   CO2 29 08/18/2023 1106   GLUCOSE 94 08/18/2023 1106   BUN 14 08/18/2023 1106   CREATININE 0.96 08/18/2023 1106   CALCIUM 9.5 08/18/2023 1106   PROT 7.2 08/18/2023 1106   ALBUMIN 4.7 08/18/2023 1106   AST 53 (H) 08/18/2023 1106   ALT 70 (H) 08/18/2023 1106   ALKPHOS 110 08/18/2023 1106   BILITOT 0.7 08/18/2023 1106   GFRNONAA >60 08/18/2023 1106   No results found for: CHOL, HDL, LDLCALC, LDLDIRECT, TRIG, CHOLHDL No results found for: YHAJ8R No results found for: VITAMINB12 No results found for: TSH   ASSESSMENT AND PLAN 53 y.o. year old female   04/05/2023: She did well on Botox (>80% of migraine and headache frequency and seveirty ) but had to try Emgality  for insurance, inurance said: I submitted an auth to [employer] and received a denial back, stating that pt would need to try and fail three of the following drugs before Botox  would be approved: Aimovig, Ajovy , Emgality , Qulipta, Dysport ,  Myobloc.  She still has daily headaches and > 15 migraine days a month will send in for botulinum approval try dysport . Per what insurance said to us  above.   Emgalit, ajovyy and nurtec did not help. Doing excellent on botox , continue Acute: continue maxalt  and ubrelvy  Tremor: stable, right hand only Video once yerly now that stable and doing great on botox  for migraine  1.  Chronic migraine headaches (no medication overuse, no aura) -Had superb benefit with initial Botox  injection in April 2024, but insurance denied subsequent injection due to requiring trial of further medication -Emgality  for 3 months not of significant benefit still with chronic migraines -Continue Ubrelvy , Maxalt  as needed for acute headache -Currently about daily headache days a month, >15 are migraines that can linger for several days and are moderate to severe and can last up to 24 hours or longer, no aura, no medication overuse. Try to get Dysport  for migraines.   -Previously tried and failed > 3 months: Ajovy , Aimovig, Emgality . Gabapentin , Topiramate(hair loss, numbness and tingling and cannot perform surgery), tylenol , flexeril , decadron , lexapro, propranolol(asthmatic,contraindicated), amitriptyline/nortriptyline(sedation and weight gain), robaxin , nsaids, OTC analgesics, verapamil and other BP meds like carvedilol (contraindicated due to low blood pressure she runs 100-114 systolic), imitrex, ubrelvy (helps),  depakote. Botox  helped the most she had one injection and >80% of migraine and headache frequency and seveirty  -Next Steps: restart Botulinum for chronic migraines, imitrex(did nothing), Rizatriptan , Nurtec did nothing, ubrelvy  helps with maxalt  and can take limited ibuprofen(or toradol ) due to previous ulcer when overusing ibuprofen  Also sounds like essential tremor, no previous history of anti-dopamine drugs, no caffeine daily, discussed parkinson's disease, deep brain stimulation, we have no medications to  alter the course except exercise, encouraged exercise, skin checks, and if she likes (dad has PD) we can can evaluate in the office and order DAT scan. She will monitor and we will see her in the office at next appointment.  Acutely uses ubrelvy  and rizatriptan  and brings it down to a 3/10. Add an ibuprofen(take with food for ulcer protection) she had an ulcer when taking ibuprofen 16 times a month and now would only need it a few times a month as long as < 6x a month I believe it would ok and take with food. Or try the toradol .   Patient complains of symptoms per HPI as well as the following symptoms: none . Pertinent negatives and positives per HPI. All others negative   Discussed:  There is increased risk for stroke in women with migraine with aura and a contraindication for the combined contraceptive pill for use by women who have migraine with aura. The risk for women with migraine without aura is lower. However other risk factors like smoking are far more likely to increase stroke risk than migraine. There is a recommendation for no smoking and for the use of OCPs without estrogen such as progestogen only pills particularly for women with migraine with aura.SABRA People who have migraine headaches with auras may be 3 times more likely to have a stroke caused by a blood clot, compared to migraine patients who don't see auras. Women who take hormone-replacement therapy may be 30 percent more likely to suffer a clot-based stroke than women not taking medication containing estrogen. Other risk factors like smoking and high blood pressure may be  much more important. And stroke is still a rare complication due to migraine aura and is controversial and lower doses may not cause a risk.  I spent 16 minutes of  face-to-face and non-face-to-face time with patient on the  1. Migraine with aura and without status migrainosus, not intractable   2. Chronic migraine without aura without status migrainosus, not  intractable    diagnosis.  This included previsit chart review, lab review, study review, order entry, electronic health record documentation, patient education on the different diagnostic and therapeutic options, counseling and coordination of care, risks and benefits of management, compliance, or risk factor reduction

## 2024-01-11 DIAGNOSIS — H43812 Vitreous degeneration, left eye: Secondary | ICD-10-CM | POA: Diagnosis not present

## 2024-02-07 DIAGNOSIS — H43823 Vitreomacular adhesion, bilateral: Secondary | ICD-10-CM | POA: Diagnosis not present

## 2024-02-07 DIAGNOSIS — H2513 Age-related nuclear cataract, bilateral: Secondary | ICD-10-CM | POA: Diagnosis not present

## 2024-02-07 DIAGNOSIS — H43813 Vitreous degeneration, bilateral: Secondary | ICD-10-CM | POA: Diagnosis not present

## 2024-02-07 DIAGNOSIS — H43392 Other vitreous opacities, left eye: Secondary | ICD-10-CM | POA: Diagnosis not present

## 2024-02-08 ENCOUNTER — Telehealth: Payer: Self-pay | Admitting: Neurology

## 2024-02-08 DIAGNOSIS — G43109 Migraine with aura, not intractable, without status migrainosus: Secondary | ICD-10-CM

## 2024-02-08 NOTE — Telephone Encounter (Addendum)
 Pt was previously established with Dr. Ines for migraine care. Her insurance initially denied Botox , stating that she needed to try Dysport . She has since had 3 visits with Dr. Ines for Dysport  and shown improvement. Dr. Onita- is this something you are able to manage moving forward, or should we try and switch her back to Botox  so she can see Lauraine (if insurance will approve)? Please let me know, her appointment is on 10/28. Thank you!

## 2024-02-08 NOTE — Telephone Encounter (Signed)
 She can see NP, but let her know, not expecting to have injection until we clarify the situation with her insurance.   There was no FDA approved indication for Dysport  as migraine prevention   DYSPORT  is an acetylcholine release inhibitor and a neuromuscular blocking agent indicated for: ?  The treatment of adults with cervical dystonia (1.1) ?  The temporary improvement in the appearance of moderate to severe glabellar lines associated with procerus and corrugator muscle activity in adult patients < 29 years of age (1.2) ?  The treatment of upper limb spasticity in adults (1.3) ?  The treatment of lower limb spasticity in pediatric patients 55 years of age and older (1.4)

## 2024-02-09 NOTE — Telephone Encounter (Signed)
 Completed auth request via CMM to try and get Botox  approved, status is pending. [id number]

## 2024-02-16 NOTE — Telephone Encounter (Signed)
 I called [employer] and spoke with Claris, she states shara has not been received from [facility]. I printed their form and filled it out and faxed it to them directly @ [phone].

## 2024-02-21 NOTE — Telephone Encounter (Signed)
 Patient called to check the status of the Botox  authorization. I informed her that we are still waiting to hear back from the insurance, but we will reach back out to her once we hear something from them. She said you can message her through mychart with any updates

## 2024-02-22 NOTE — Telephone Encounter (Signed)
 Called [employer] again, they are stating auth still hasn't been received. I was able to get into their portal and submitted auth as urgent, pending with case # [id number].

## 2024-02-27 NOTE — Telephone Encounter (Signed)
 Received denial from [employer]. I faxed an urgent appeal request showing the pt has tried Dysport . [phone])

## 2024-03-01 NOTE — Telephone Encounter (Signed)
 Called to check on appeal, spoke with Morna. She states appeal has not be received and advised me to refax. I verified fax # is [phone] and resent it 3x.

## 2024-03-06 ENCOUNTER — Ambulatory Visit: Admitting: Neurology

## 2024-03-06 NOTE — Telephone Encounter (Signed)
 Called [employer] and spoke with Angelica, she states she sees the appeal was received and will send an email to the clinical team to work on it. I should hear back within 24 business hours.

## 2024-03-10 ENCOUNTER — Other Ambulatory Visit: Payer: Self-pay | Admitting: Neurology

## 2024-03-13 ENCOUNTER — Other Ambulatory Visit: Payer: Self-pay | Admitting: Internal Medicine

## 2024-03-13 DIAGNOSIS — Z1231 Encounter for screening mammogram for malignant neoplasm of breast: Secondary | ICD-10-CM

## 2024-03-13 MED ORDER — ONABOTULINUMTOXINA 200 UNITS IJ SOLR: INTRAMUSCULAR | 2 refills | Status: DC

## 2024-03-13 NOTE — Addendum Note (Signed)
 Addended by: JOSHUA MAURILIO CROME on: 03/13/2024 04:31 PM   Modules accepted: Orders

## 2024-03-13 NOTE — Telephone Encounter (Signed)
 Unable to find [facility] in [place]. Called [phone] and spoke w/ Gilda. Confirmed correct pharmacy:    [facility] ([address] [place], [place] - [address] Phone: [phone]  Fax: [phone]     Added to list of pharmacies and e-scribed rx as requested.

## 2024-03-14 ENCOUNTER — Telehealth: Payer: Self-pay | Admitting: Neurology

## 2024-03-14 NOTE — Telephone Encounter (Signed)
 I came back from being away from my desk and my phone was ringing. I was able to take the call and schedule her for Botox  with Sarah for 11/19 @ 1:45 pm.

## 2024-03-14 NOTE — Telephone Encounter (Signed)
 Pt called to schedule her Botox 

## 2024-03-14 NOTE — Telephone Encounter (Signed)
 Auth#: [id number] (03/07/24-03/07/25)

## 2024-03-14 NOTE — Telephone Encounter (Signed)
 FYI- Everything is squared away for this patient and she's coming to see you on 11/19, Botox  TBD 11/11.

## 2024-03-28 ENCOUNTER — Ambulatory Visit (INDEPENDENT_AMBULATORY_CARE_PROVIDER_SITE_OTHER): Admitting: Neurology

## 2024-03-28 VITALS — BP 115/78

## 2024-03-28 DIAGNOSIS — G43109 Migraine with aura, not intractable, without status migrainosus: Secondary | ICD-10-CM

## 2024-03-28 DIAGNOSIS — G43709 Chronic migraine without aura, not intractable, without status migrainosus: Secondary | ICD-10-CM | POA: Diagnosis not present

## 2024-03-28 MED ORDER — ONABOTULINUMTOXINA 200 UNITS IJ SOLR
155.0000 [IU] | Freq: Once | INTRAMUSCULAR | Status: AC
  Administered 2024-03-28: 155 [IU] via INTRAMUSCULAR

## 2024-03-28 NOTE — Progress Notes (Signed)
   BOTOX  PROCEDURE NOTE FOR MIGRAINE HEADACHE  HISTORY: Haizley Cannella is here for Botox . Last was 12/06/23 with Dr. Ines. Previously on Dysport  but now switched to Botox . A few weeks late for Botox . Takes Maxalt /Ubrelvy  for rescue. Last month was a bad month coming off prednisone , triggered cluster migraines, 10 a month, prior, only 1. This month none so far. Very pleased with Botox ! Motivated to continue.   Description of procedure:  The patient was placed in a sitting position. The standard protocol was used for Botox  as follows, with 5 units of Botox  injected at each site:  -Procerus muscle, midline injection  -Corrugator muscle, bilateral injection  -Frontalis muscle, bilateral injection, with 2 sites each side, medial injection was performed in the upper one third of the frontalis muscle, in the region vertical from the medial inferior edge of the superior orbital rim. The lateral injection was again in the upper one third of the forehead vertically above the lateral limbus of the cornea, 1.5 cm lateral to the medial injection site.  -Temporalis muscle injection, 4 sites, bilaterally. The first injection was 3 cm above the tragus of the ear, second injection site was 1.5 cm to 3 cm up from the first injection site in line with the tragus of the ear. The third injection site was 1.5-3 cm forward between the first 2 injection sites. The fourth injection site was 1.5 cm posterior to the second injection site.  -Occipitalis muscle injection, 3 sites, bilaterally. The first injection was done one half way between the occipital protuberance and the tip of the mastoid process behind the ear. The second injection site was done lateral and superior to the first, 1 fingerbreadth from the first injection. The third injection site was 1 fingerbreadth superiorly and medially from the first injection site.  -Cervical paraspinal muscle injection, 2 sites, bilateral, the first injection site was 1 cm  from the midline of the cervical spine, 3 cm inferior to the lower border of the occipital protuberance. The second injection site was 1.5 cm superiorly and laterally to the first injection site.  -Trapezius muscle injection was performed at 3 sites, bilaterally. The first injection site was in the upper trapezius muscle halfway between the inflection point of the neck, and the acromion. The second injection site was one half way between the acromion and the first injection site. The third injection was done between the first injection site and the inflection point of the neck.   A 200 unit bottle of Botox  was used, 155 units were injected, the rest of the Botox  was wasted. The patient tolerated the procedure well, there were no complications of the above procedure.  Botox  NDC 9976-6078-97 Lot number [id number] Expiration date 10/2025 SP

## 2024-03-28 NOTE — Progress Notes (Signed)
 Botox - 200 units x 1 vial Lot: [id number] Expiration: 2027/06 NDC: 0023-3921-02  Bacteriostatic 0.9% Sodium Chloride - 4 mL  Lot: [id number] Expiration: 03/09/25 NDC: 9590803397  Dx: H56.890, G43.709 S/P  Witnessed by DELON ORN RMA

## 2024-04-10 DIAGNOSIS — Z1331 Encounter for screening for depression: Secondary | ICD-10-CM | POA: Diagnosis not present

## 2024-04-10 DIAGNOSIS — R7989 Other specified abnormal findings of blood chemistry: Secondary | ICD-10-CM | POA: Diagnosis not present

## 2024-04-10 DIAGNOSIS — Z0000 Encounter for general adult medical examination without abnormal findings: Secondary | ICD-10-CM | POA: Diagnosis not present

## 2024-04-10 DIAGNOSIS — Z1389 Encounter for screening for other disorder: Secondary | ICD-10-CM | POA: Diagnosis not present

## 2024-04-24 DIAGNOSIS — H43812 Vitreous degeneration, left eye: Secondary | ICD-10-CM | POA: Diagnosis not present

## 2024-04-25 ENCOUNTER — Inpatient Hospital Stay: Admission: RE | Admit: 2024-04-25 | Discharge: 2024-04-25 | Attending: Internal Medicine | Admitting: Internal Medicine

## 2024-04-25 DIAGNOSIS — Z1231 Encounter for screening mammogram for malignant neoplasm of breast: Secondary | ICD-10-CM

## 2024-04-27 ENCOUNTER — Other Ambulatory Visit (HOSPITAL_COMMUNITY): Payer: Self-pay

## 2024-04-27 ENCOUNTER — Telehealth: Payer: Self-pay

## 2024-04-27 NOTE — Telephone Encounter (Signed)
 Pharmacy Patient Advocate Encounter   Received notification from CoverMyMeds that prior authorization for Ubrelvy  is required/requested.   Insurance verification completed.   The patient is insured through [employer].   Per test claim: PA required; PA submitted to above mentioned insurance via CoverMyMeds Key/confirmation #/EOC [id number] Status is pending

## 2024-05-02 ENCOUNTER — Other Ambulatory Visit (HOSPITAL_COMMUNITY): Payer: Self-pay

## 2024-05-02 NOTE — Telephone Encounter (Signed)
 Pharmacy Patient Advocate Encounter  Received notification from [employer] that Prior Authorization for Ubrelvyhas been APPROVED from 05/01/2024 to 05/02/2025   PA #/Case ID/Reference #: [id number]

## 2024-05-29 ENCOUNTER — Telehealth: Payer: Self-pay | Admitting: Neurology

## 2024-05-29 ENCOUNTER — Encounter: Payer: Self-pay | Admitting: Neurology

## 2024-05-29 NOTE — Telephone Encounter (Signed)
 Pt has r/s her Botox  to 9:15 on 07-10-24

## 2024-06-06 ENCOUNTER — Telehealth: Payer: Self-pay | Admitting: Neurology

## 2024-06-06 DIAGNOSIS — G43109 Migraine with aura, not intractable, without status migrainosus: Secondary | ICD-10-CM

## 2024-06-06 MED ORDER — ONABOTULINUMTOXINA 200 UNITS IJ SOLR: INTRAMUSCULAR | 2 refills | Status: AC

## 2024-06-06 NOTE — Telephone Encounter (Signed)
 refilled

## 2024-06-06 NOTE — Addendum Note (Signed)
 Addended by: ONEITA NEVELYN BRAVO on: 06/06/2024 03:24 PM   Modules accepted: Orders

## 2024-06-06 NOTE — Telephone Encounter (Signed)
 Pt's medical insurance is the same but her PBM has changed to [facility]. The preferred SP is now [facility], please send rx.  Auth#: [id number] (05/10/24-11/07/24)

## 2024-06-26 ENCOUNTER — Ambulatory Visit: Admitting: Neurology

## 2024-06-27 ENCOUNTER — Ambulatory Visit: Admitting: Neurology

## 2024-07-10 ENCOUNTER — Ambulatory Visit: Admitting: Neurology

## 2025-01-07 ENCOUNTER — Ambulatory Visit: Admitting: Neurology

## 2025-01-07 ENCOUNTER — Telehealth: Admitting: Neurology
